# Patient Record
Sex: Female | Born: 1990 | Race: Black or African American | Hispanic: No | Marital: Single | Smoking: Former smoker
Health system: Southern US, Community
[De-identification: ages and names within clinical notes are randomized; demographics above are authoritative.]

## PROBLEM LIST (undated history)

## (undated) ENCOUNTER — Inpatient Hospital Stay (HOSPITAL_COMMUNITY): Payer: Self-pay

## (undated) DIAGNOSIS — F909 Attention-deficit hyperactivity disorder, unspecified type: Secondary | ICD-10-CM

## (undated) DIAGNOSIS — E0590 Thyrotoxicosis, unspecified without thyrotoxic crisis or storm: Secondary | ICD-10-CM

## (undated) DIAGNOSIS — N838 Other noninflammatory disorders of ovary, fallopian tube and broad ligament: Secondary | ICD-10-CM

## (undated) DIAGNOSIS — IMO0002 Reserved for concepts with insufficient information to code with codable children: Secondary | ICD-10-CM

## (undated) DIAGNOSIS — Z9889 Other specified postprocedural states: Secondary | ICD-10-CM

## (undated) DIAGNOSIS — F32A Depression, unspecified: Secondary | ICD-10-CM

## (undated) DIAGNOSIS — A599 Trichomoniasis, unspecified: Secondary | ICD-10-CM

## (undated) DIAGNOSIS — N739 Female pelvic inflammatory disease, unspecified: Secondary | ICD-10-CM

## (undated) DIAGNOSIS — O139 Gestational [pregnancy-induced] hypertension without significant proteinuria, unspecified trimester: Secondary | ICD-10-CM

## (undated) DIAGNOSIS — F319 Bipolar disorder, unspecified: Secondary | ICD-10-CM

## (undated) DIAGNOSIS — N912 Amenorrhea, unspecified: Secondary | ICD-10-CM

## (undated) DIAGNOSIS — R87629 Unspecified abnormal cytological findings in specimens from vagina: Secondary | ICD-10-CM

## (undated) DIAGNOSIS — O1500 Eclampsia in pregnancy, unspecified trimester: Secondary | ICD-10-CM

## (undated) DIAGNOSIS — R7989 Other specified abnormal findings of blood chemistry: Secondary | ICD-10-CM

## (undated) DIAGNOSIS — F329 Major depressive disorder, single episode, unspecified: Secondary | ICD-10-CM

## (undated) HISTORY — DX: Attention-deficit hyperactivity disorder, unspecified type: F90.9

## (undated) HISTORY — DX: Reserved for concepts with insufficient information to code with codable children: IMO0002

## (undated) HISTORY — PX: LEEP: SHX91

## (undated) HISTORY — DX: Thyrotoxicosis, unspecified without thyrotoxic crisis or storm: E05.90

## (undated) HISTORY — DX: Bipolar disorder, unspecified: F31.9

## (undated) HISTORY — DX: Other noninflammatory disorders of ovary, fallopian tube and broad ligament: N83.8

## (undated) HISTORY — DX: Amenorrhea, unspecified: N91.2

## (undated) HISTORY — DX: Unspecified abnormal cytological findings in specimens from vagina: R87.629

## (undated) HISTORY — DX: Other specified abnormal findings of blood chemistry: R79.89

## (undated) HISTORY — PX: WISDOM TOOTH EXTRACTION: SHX21

## (undated) HISTORY — DX: Female pelvic inflammatory disease, unspecified: N73.9

## (undated) HISTORY — DX: Eclampsia complicating pregnancy, unspecified trimester: O15.00

## (undated) HISTORY — DX: Other specified postprocedural states: Z98.890

---
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
## 2008-01-08 ENCOUNTER — Emergency Department (HOSPITAL_COMMUNITY): Admission: EM | Admit: 2008-01-08 | Discharge: 2008-01-08 | Payer: Self-pay | Admitting: Emergency Medicine

## 2008-03-26 ENCOUNTER — Emergency Department (HOSPITAL_COMMUNITY): Admission: EM | Admit: 2008-03-26 | Discharge: 2008-03-26 | Payer: Self-pay | Admitting: Family Medicine

## 2008-04-04 ENCOUNTER — Emergency Department (HOSPITAL_COMMUNITY): Admission: EM | Admit: 2008-04-04 | Discharge: 2008-04-04 | Payer: Self-pay | Admitting: Emergency Medicine

## 2008-04-15 ENCOUNTER — Emergency Department (HOSPITAL_COMMUNITY): Admission: EM | Admit: 2008-04-15 | Discharge: 2008-04-15 | Payer: Self-pay | Admitting: Emergency Medicine

## 2008-05-27 ENCOUNTER — Telehealth: Payer: Self-pay | Admitting: *Deleted

## 2008-06-03 ENCOUNTER — Other Ambulatory Visit: Admission: RE | Admit: 2008-06-03 | Discharge: 2008-06-03 | Payer: Self-pay | Admitting: Family Medicine

## 2008-06-03 ENCOUNTER — Encounter (INDEPENDENT_AMBULATORY_CARE_PROVIDER_SITE_OTHER): Payer: Self-pay | Admitting: Family Medicine

## 2008-06-03 ENCOUNTER — Ambulatory Visit: Payer: Self-pay | Admitting: Family Medicine

## 2008-06-03 ENCOUNTER — Encounter: Payer: Self-pay | Admitting: Family Medicine

## 2008-06-03 LAB — CONVERTED CEMR LAB
Beta hcg, urine, semiquantitative: NEGATIVE
Whiff Test: POSITIVE

## 2008-06-04 LAB — CONVERTED CEMR LAB
Pap Smear: ABNORMAL
Pap Smear: NORMAL

## 2008-06-05 LAB — CONVERTED CEMR LAB
Chlamydia, DNA Probe: NEGATIVE
GC Probe Amp, Genital: NEGATIVE

## 2008-06-10 ENCOUNTER — Telehealth: Payer: Self-pay | Admitting: Family Medicine

## 2008-06-12 ENCOUNTER — Encounter: Payer: Self-pay | Admitting: Family Medicine

## 2008-06-12 ENCOUNTER — Ambulatory Visit: Payer: Self-pay | Admitting: Sports Medicine

## 2008-06-12 ENCOUNTER — Telehealth: Payer: Self-pay | Admitting: *Deleted

## 2008-06-12 LAB — CONVERTED CEMR LAB
Beta hcg, urine, semiquantitative: NEGATIVE
HCT: 44.5 % (ref 36.0–46.0)
Hemoglobin: 15 g/dL (ref 12.0–15.0)
MCHC: 33.7 g/dL (ref 30.0–36.0)
MCV: 88.1 fL (ref 78.0–100.0)
Platelets: 286 10*3/uL (ref 150–400)
RBC: 5.05 M/uL (ref 3.87–5.11)
RDW: 13.5 % (ref 11.5–15.5)
TSH: 1.605 microintl units/mL (ref 0.350–4.50)
WBC: 10 10*3/uL (ref 4.0–10.5)

## 2008-06-18 ENCOUNTER — Emergency Department (HOSPITAL_COMMUNITY): Admission: EM | Admit: 2008-06-18 | Discharge: 2008-06-19 | Payer: Self-pay | Admitting: Emergency Medicine

## 2008-06-18 ENCOUNTER — Telehealth: Payer: Self-pay | Admitting: *Deleted

## 2008-06-19 ENCOUNTER — Encounter: Payer: Self-pay | Admitting: *Deleted

## 2008-06-20 ENCOUNTER — Ambulatory Visit: Payer: Self-pay | Admitting: Family Medicine

## 2008-06-20 DIAGNOSIS — F39 Unspecified mood [affective] disorder: Secondary | ICD-10-CM | POA: Insufficient documentation

## 2008-07-08 ENCOUNTER — Telehealth: Payer: Self-pay | Admitting: *Deleted

## 2008-07-14 ENCOUNTER — Encounter: Payer: Self-pay | Admitting: *Deleted

## 2008-07-27 ENCOUNTER — Emergency Department (HOSPITAL_COMMUNITY): Admission: EM | Admit: 2008-07-27 | Discharge: 2008-07-27 | Payer: Self-pay | Admitting: Emergency Medicine

## 2008-09-03 ENCOUNTER — Telehealth (INDEPENDENT_AMBULATORY_CARE_PROVIDER_SITE_OTHER): Payer: Self-pay | Admitting: *Deleted

## 2008-09-04 ENCOUNTER — Emergency Department (HOSPITAL_COMMUNITY): Admission: EM | Admit: 2008-09-04 | Discharge: 2008-09-04 | Payer: Self-pay | Admitting: Emergency Medicine

## 2008-09-16 ENCOUNTER — Encounter: Payer: Self-pay | Admitting: Family Medicine

## 2008-11-19 ENCOUNTER — Encounter: Payer: Self-pay | Admitting: Family Medicine

## 2008-11-19 ENCOUNTER — Ambulatory Visit: Payer: Self-pay | Admitting: Family Medicine

## 2008-11-19 LAB — CONVERTED CEMR LAB: Beta hcg, urine, semiquantitative: NEGATIVE

## 2008-11-28 ENCOUNTER — Telehealth: Payer: Self-pay | Admitting: *Deleted

## 2009-01-07 ENCOUNTER — Telehealth: Payer: Self-pay | Admitting: Family Medicine

## 2009-01-09 ENCOUNTER — Telehealth (INDEPENDENT_AMBULATORY_CARE_PROVIDER_SITE_OTHER): Payer: Self-pay | Admitting: Family Medicine

## 2009-02-18 ENCOUNTER — Ambulatory Visit: Payer: Self-pay | Admitting: Family Medicine

## 2009-04-06 ENCOUNTER — Ambulatory Visit: Payer: Self-pay | Admitting: Family Medicine

## 2009-04-06 ENCOUNTER — Encounter: Payer: Self-pay | Admitting: Family Medicine

## 2009-04-06 LAB — CONVERTED CEMR LAB
ALT: 9 units/L (ref 0–35)
AST: 17 units/L (ref 0–37)
Albumin: 4.6 g/dL (ref 3.5–5.2)
Alkaline Phosphatase: 67 units/L (ref 39–117)
BUN: 11 mg/dL (ref 6–23)
Basophils Absolute: 0.1 10*3/uL (ref 0.0–0.1)
Basophils Relative: 1 % (ref 0–1)
CO2: 22 meq/L (ref 19–32)
Calcium: 10.2 mg/dL (ref 8.4–10.5)
Chloride: 103 meq/L (ref 96–112)
Creatinine, Ser: 0.88 mg/dL (ref 0.40–1.20)
Eosinophils Absolute: 0.3 10*3/uL (ref 0.0–0.7)
Eosinophils Relative: 4 % (ref 0–5)
Glucose, Bld: 74 mg/dL (ref 70–99)
HCT: 43.8 % (ref 36.0–46.0)
Hemoglobin: 14.5 g/dL (ref 12.0–15.0)
Lymphocytes Relative: 37 % (ref 12–46)
Lymphs Abs: 2.6 10*3/uL (ref 0.7–4.0)
MCHC: 33.1 g/dL (ref 30.0–36.0)
MCV: 89.6 fL (ref 78.0–100.0)
Monocytes Absolute: 0.5 10*3/uL (ref 0.1–1.0)
Monocytes Relative: 7 % (ref 3–12)
Neutro Abs: 3.6 10*3/uL (ref 1.7–7.7)
Neutrophils Relative %: 52 % (ref 43–77)
Platelets: 269 10*3/uL (ref 150–400)
Potassium: 4.3 meq/L (ref 3.5–5.3)
RBC: 4.89 M/uL (ref 3.87–5.11)
RDW: 12.6 % (ref 11.5–15.5)
Sodium: 135 meq/L (ref 135–145)
TSH: 2.222 microintl units/mL (ref 0.350–4.500)
Total Bilirubin: 0.4 mg/dL (ref 0.3–1.2)
Total Protein: 8.1 g/dL (ref 6.0–8.3)
WBC: 6.9 10*3/uL (ref 4.0–10.5)

## 2009-06-06 ENCOUNTER — Telehealth: Payer: Self-pay | Admitting: Family Medicine

## 2009-06-11 ENCOUNTER — Ambulatory Visit: Payer: Self-pay | Admitting: Family Medicine

## 2009-06-11 LAB — CONVERTED CEMR LAB: Beta hcg, urine, semiquantitative: NEGATIVE

## 2009-06-15 ENCOUNTER — Ambulatory Visit: Payer: Self-pay | Admitting: Family Medicine

## 2009-08-08 ENCOUNTER — Encounter (INDEPENDENT_AMBULATORY_CARE_PROVIDER_SITE_OTHER): Payer: Self-pay | Admitting: *Deleted

## 2009-08-08 DIAGNOSIS — F17200 Nicotine dependence, unspecified, uncomplicated: Secondary | ICD-10-CM | POA: Insufficient documentation

## 2009-09-30 ENCOUNTER — Encounter: Payer: Self-pay | Admitting: Family Medicine

## 2009-09-30 ENCOUNTER — Ambulatory Visit: Payer: Self-pay | Admitting: Family Medicine

## 2009-09-30 DIAGNOSIS — R011 Cardiac murmur, unspecified: Secondary | ICD-10-CM

## 2009-09-30 LAB — CONVERTED CEMR LAB
Beta hcg, urine, semiquantitative: NEGATIVE
HCT: 41.5 % (ref 36.0–46.0)
Hemoglobin: 13.7 g/dL (ref 12.0–15.0)
MCHC: 33 g/dL (ref 30.0–36.0)
MCV: 93 fL (ref 78.0–100.0)
RDW: 12.1 % (ref 11.5–15.5)

## 2009-10-26 ENCOUNTER — Emergency Department (HOSPITAL_COMMUNITY): Admission: EM | Admit: 2009-10-26 | Discharge: 2009-10-26 | Payer: Self-pay | Admitting: Emergency Medicine

## 2009-12-18 ENCOUNTER — Ambulatory Visit: Payer: Self-pay | Admitting: Family Medicine

## 2010-01-01 ENCOUNTER — Encounter: Payer: Self-pay | Admitting: Family Medicine

## 2010-01-01 ENCOUNTER — Telehealth: Payer: Self-pay | Admitting: Family Medicine

## 2010-01-27 ENCOUNTER — Telehealth: Payer: Self-pay | Admitting: Family Medicine

## 2010-01-27 ENCOUNTER — Encounter: Payer: Self-pay | Admitting: *Deleted

## 2010-01-27 ENCOUNTER — Ambulatory Visit: Payer: Self-pay | Admitting: Family Medicine

## 2010-01-29 ENCOUNTER — Ambulatory Visit: Payer: Self-pay | Admitting: Family Medicine

## 2010-02-09 ENCOUNTER — Emergency Department (HOSPITAL_COMMUNITY): Admission: EM | Admit: 2010-02-09 | Discharge: 2010-02-09 | Payer: Self-pay | Admitting: Emergency Medicine

## 2010-02-12 ENCOUNTER — Ambulatory Visit: Payer: Self-pay | Admitting: Family Medicine

## 2010-03-05 ENCOUNTER — Emergency Department (HOSPITAL_COMMUNITY): Admission: EM | Admit: 2010-03-05 | Discharge: 2010-03-05 | Payer: Self-pay | Admitting: Emergency Medicine

## 2010-04-07 ENCOUNTER — Ambulatory Visit: Payer: Self-pay | Admitting: Family Medicine

## 2010-05-08 ENCOUNTER — Telehealth: Payer: Self-pay | Admitting: Family Medicine

## 2010-07-29 ENCOUNTER — Ambulatory Visit: Payer: Self-pay | Admitting: Family Medicine

## 2010-07-29 LAB — CONVERTED CEMR LAB: Beta hcg, urine, semiquantitative: POSITIVE

## 2010-07-30 ENCOUNTER — Telehealth: Payer: Self-pay | Admitting: Family Medicine

## 2010-08-03 ENCOUNTER — Encounter: Payer: Self-pay | Admitting: Family Medicine

## 2010-08-05 ENCOUNTER — Telehealth: Payer: Self-pay | Admitting: Family Medicine

## 2010-08-13 ENCOUNTER — Encounter: Payer: Self-pay | Admitting: Family Medicine

## 2010-08-13 ENCOUNTER — Ambulatory Visit: Payer: Self-pay | Admitting: Family Medicine

## 2010-08-13 LAB — CONVERTED CEMR LAB
Eosinophils Relative: 1 % (ref 0–5)
HCT: 33.5 % — ABNORMAL LOW (ref 36.0–46.0)
Hemoglobin: 11.6 g/dL — ABNORMAL LOW (ref 12.0–15.0)
Lymphocytes Relative: 30 % (ref 12–46)
Lymphs Abs: 2.9 10*3/uL (ref 0.7–4.0)
Monocytes Absolute: 0.6 10*3/uL (ref 0.1–1.0)
Neutro Abs: 5.9 10*3/uL (ref 1.7–7.7)
RBC: 3.87 M/uL (ref 3.87–5.11)
Rh Type: POSITIVE
Rubella: 31.8 intl units/mL — ABNORMAL HIGH
Sickle Cell Screen: NEGATIVE
WBC: 9.5 10*3/uL (ref 4.0–10.5)

## 2010-08-20 ENCOUNTER — Other Ambulatory Visit: Admission: RE | Admit: 2010-08-20 | Discharge: 2010-08-20 | Payer: Self-pay | Admitting: Family Medicine

## 2010-08-20 ENCOUNTER — Ambulatory Visit: Payer: Self-pay | Admitting: Family Medicine

## 2010-08-20 ENCOUNTER — Encounter: Payer: Self-pay | Admitting: Family Medicine

## 2010-08-20 LAB — CONVERTED CEMR LAB
Chlamydia, DNA Probe: NEGATIVE
Pap Smear: NEGATIVE

## 2010-08-24 ENCOUNTER — Ambulatory Visit (HOSPITAL_COMMUNITY): Admission: RE | Admit: 2010-08-24 | Discharge: 2010-08-24 | Payer: Self-pay | Admitting: Family Medicine

## 2010-09-14 ENCOUNTER — Ambulatory Visit (HOSPITAL_COMMUNITY): Admission: RE | Admit: 2010-09-14 | Discharge: 2010-09-14 | Payer: Self-pay | Admitting: Family Medicine

## 2010-09-22 ENCOUNTER — Ambulatory Visit: Payer: Self-pay | Admitting: Family Medicine

## 2010-10-05 ENCOUNTER — Ambulatory Visit (HOSPITAL_COMMUNITY): Admission: RE | Admit: 2010-10-05 | Discharge: 2010-10-05 | Payer: Self-pay | Admitting: Family Medicine

## 2010-10-22 ENCOUNTER — Encounter: Payer: Self-pay | Admitting: *Deleted

## 2010-10-25 ENCOUNTER — Ambulatory Visit (HOSPITAL_COMMUNITY)
Admission: RE | Admit: 2010-10-25 | Discharge: 2010-10-25 | Payer: Self-pay | Source: Home / Self Care | Admitting: Family Medicine

## 2010-11-02 ENCOUNTER — Ambulatory Visit: Payer: Self-pay | Admitting: Family Medicine

## 2010-11-02 ENCOUNTER — Encounter: Payer: Self-pay | Admitting: Family Medicine

## 2010-11-02 DIAGNOSIS — G479 Sleep disorder, unspecified: Secondary | ICD-10-CM | POA: Insufficient documentation

## 2010-11-02 LAB — CONVERTED CEMR LAB: TSH: 2.056 microintl units/mL (ref 0.350–4.500)

## 2010-11-03 ENCOUNTER — Telehealth: Payer: Self-pay | Admitting: Family Medicine

## 2010-11-17 ENCOUNTER — Ambulatory Visit: Payer: Self-pay | Admitting: Family Medicine

## 2010-11-17 DIAGNOSIS — N760 Acute vaginitis: Secondary | ICD-10-CM | POA: Insufficient documentation

## 2010-11-17 DIAGNOSIS — R109 Unspecified abdominal pain: Secondary | ICD-10-CM | POA: Insufficient documentation

## 2010-11-17 LAB — CONVERTED CEMR LAB
Bilirubin Urine: NEGATIVE
Glucose, Urine, Semiquant: NEGATIVE
Ketones, urine, test strip: NEGATIVE
Specific Gravity, Urine: >=1.03
Urobilinogen, UA: 0.2

## 2010-11-18 ENCOUNTER — Encounter: Payer: Self-pay | Admitting: Family Medicine

## 2010-12-01 ENCOUNTER — Telehealth: Payer: Self-pay | Admitting: Family Medicine

## 2010-12-07 ENCOUNTER — Encounter: Payer: Self-pay | Admitting: Family Medicine

## 2010-12-11 ENCOUNTER — Encounter: Payer: Self-pay | Admitting: Family Medicine

## 2010-12-13 ENCOUNTER — Ambulatory Visit: Admission: RE | Admit: 2010-12-13 | Discharge: 2010-12-13 | Payer: Self-pay | Source: Home / Self Care

## 2010-12-21 NOTE — Progress Notes (Signed)
Summary: triage  Phone Note Call from Patient Call back at Home Phone [phone]   Caller: Patient Summary of Call: body is sore - wants to come in today Initial call taken by: De Nurse,  January 27, 2010 8:35 AM  Follow-up for Phone Call        she started this yesterday. took advil but states it made it worse. also has a pain in her "belly" offered wi now. she will be here at 1:30 today.  discussed her many DNKAs. told her she is risking suspension and may need to find another md if this continues. states she will kep this appt. FYI to Dennison Nancy, RN Follow-up by: Golden Circle RN,  January 27, 2010 8:42 AM

## 2010-12-21 NOTE — Assessment & Plan Note (Signed)
Summary: NOB/DSL   Vital Signs:  Patient profile:   20 year old female LMP:     05/28/2010 Height:      63.25 inches Weight:      107.1 pounds BMI:     18.89 Pulse rate:   74 / minute BP sitting:   122 / 69  Menstrual History Menarche (age onset-years):  7 Menses Interval (days):  45 days (pt was on depo would miss shots at time. Last Depo shot in Feb 2011 Menstrual Flow (days):  5  Primary Provider:  Bobby Rumpf  MD   History of Present Illness: 20 yo G1P0 here for first OB visit.  LMP 05/28/10. Pt reports  that she is sure of her LMP. However, reports that the menstrual periods wasabnormally long (14 days) and heavy. She was using Depo for birth control. Pt reports that she ocassionaly miss shots, last shot was in February.   See flowsheet for ROS and OB exam.   Pt admits to abdominal cramping, similar to what she feels when she has her menstrual period. Most days of the week that last for 10 mins. She describes that pain as light to moderate. She has not taken any pain medication.   Pt admits to sharp frontal HA. HA last for about 10 minutes and then resolve. She has HA most days, but not everyday. She has not taken any medication for the HA.   Habits & Providers  Alcohol-Tobacco-Diet     Cigarette Packs/Day: n/a  Allergies: No Known Drug Allergies  Social History: sexually active.  lives with mom.  boyfriend (age 28).  is in contact with her mom, 5 sisters, niece.  Smoked 1/2 ppd of cigarettes. No smoking now. denies drug or etoh use.  currently between 10-11th grade.  looking for job.   sister: Radford Pax, cherish Apo.  mother Etheleen Sia cummingsEducation:  10th grade Hepatitis Risk:  no Packs/Day:  n/a   Impression & Recommendations:  Problem # 1:  SUPERVISION OF NORMAL FIRST PREGNANCY (ICD-V22.0) 20 yo G1P0 at [redacted]w[redacted]d today based  reliable LMP of 05/28/10 --> EDC 03/04/11.  Labs: O +, Ab neg.  H/H 11.6/33.5, Plt 280. HbsAg neg, RPR neg, Rubella immune, HIV neg,  sickle cell neg, GBS neg.   Plan: Start PNV With difficulty assessing FHT, will schedule T1 [place] for dating. Scheduled.  Pt elected for integrated genetic screen. Scheduled  Exposure to chicken-pox unknowns status, will draw titers today. PAP, GC/Chlam and wet prep today.   Orders: Miscellaneous Lab Charge-[facility] [phone]) Prenatal U/S < 14 weeks - 60454  (Prenatal U/S) Obstetric Referral (Obstetric) Medicaid OB visit - [facility] ([id number])  Complete Medication List: 1)  Th Prenatal Vitamins 28-0.8 Mg Tabs (Prenatal vit-fe fumarate-fa) .... Take one tab daily  Other Orders: GC/Chlamydia-[facility] ([id number]) [id number] Pap Smear-[facility] ([id number])  Patient Instructions: 1)  Lollie, 2)  Thank you for coming in today. 3)  It was nice to meet you.  4)  I look forward to taking care of you during your pregnancy. 5)  Take your prenatal vitamin daily. 6)  Drink plenty of fluids and eat a well balanced diet.  7)  You will have and ultrasound to confirm your dates. This will be done in the radiology department at [facility].  8)  [address], [place], [place] [address]  9)  As we discussed, you will have the integrated genetic screen to assess for genetic risk. It is 2 blood test and an ultrasound.  10)  Please schedule a f/u vist with me for 4 weeks.  11)  -Dr. Armen Pickup Prescriptions: TH PRENATAL VITAMINS 28-0.8 MG TABS (PRENATAL VIT-FE FUMARATE-FA) Take one tab daily  #100 x 6   Entered and Authorized by:   Dessa Phi MD   Signed by:   Dessa Phi MD on 08/20/2010   Method used:   Print then Give to Patient   RxID:   [id number]    OB Initial Intake Information    Positive HCG by: office test 07/29/10    Race: Black    Marital status: Single    Occupation: unemployed    Education (last grade completed): 10th grade    Number of children at home: 0    Newborn's physician: Dr. Armen Pickup  FOB Information    Husband/Father of baby: Rickey Barbara    FOB  occupation unemployed  Menstrual History    LMP (date): 05/28/2010    EDC by LMP: 03/04/2011    Best Working EDC: 03/04/2011    LMP - Character: heavy    LMP - Reliable? : Yes    Menarche: 7 years    Menses interval: 45 days (pt was on depo would miss shots at time. Last Depo shot in Feb 2011 days    Menstrual flow 5 days    On BCP's at conception: no    Pre Pregnancy Weight: 103 lbs.    Symptoms since LMP: amenorrhea, fatigue, irritability, tender breasts    Other symptoms: abdominal cramping     OB Initial Intake Information    Positive HCG by: office test 07/29/10    Race: Black    Marital status: Single    Occupation: unemployed    Education (last grade completed): 10th grade    Number of children at home: 0    Newborn's physician: Dr. Armen Pickup  FOB Information    Husband/Father of baby: Rickey Barbara    FOB occupation unemployed  Menstrual History    LMP (date): 05/28/2010    EDC by LMP: 03/04/2011    Best Working EDC: 03/04/2011    LMP - Character: heavy    LMP - Reliable? : Yes    Menarche: 7 years    Menses interval: 45 days (pt was on depo would miss shots at time. Last Depo shot in Feb 2011 days    Menstrual flow 5 days    On BCP's at conception: no    Pre Pregnancy Weight: 103 lbs.    Symptoms since LMP: amenorrhea, fatigue, irritability, tender breasts    Other symptoms: abdominal cramping    Flowsheet View for Follow-up Visit    Estimated weeks of       gestation:     12 0/7    Weight:     107.1    Blood pressure:   122 / 69    Headache:     few    Nausea/vomiting:   No    Edema:     0    Vaginal bleeding:   no    Vaginal discharge:   d/c    FHR:       ?    Fetal activity:     no    Labor symptoms:   no    Cx Dilation:     0    Cx Effacement:   0%    Cx Station:     high    Taking prenatal vits?   N    Smoking:     n/a  Next visit:     4 wk    Resident:     Armen Pickup    Preceptor:     Mauricio Po    Comment:     Unable to hear FHT with  doppler. Was able to see fetal hrt movement on bedside sono.     Vital Signs:  Patient Profile:   20 year old female LMP:     05/28/2010 Height:     63.25 inches (161.29 cm) Weight:      107.1 pounds BMI:     18.89 Pulse rate:   74 / minute BP sitting:   122 / 69  Vitals Entered By: Garen Grams LPN (August 20, 2010 1:53 PM)                Prenatal Visit    FOB name: Rickey Barbara [facility] Confirmation:    New working [facility]: 03/04/2011    LMP reliable? Yes    Last menses onset (LMP) date: 05/28/2010    EDC by LMP: 03/04/2011   Past Pregnancy History    Gravida:     1    Para:       0   Genetic History    Father of baby:   Rickey Barbara     Thalassemia:     mother: no    Neural tube defect:   mother: no    Down's Syndrome:   mother: no    Tay-Sachs:     mother: no    Sickle Cell Dz/Trait:   mother: no    Hemophilia:     mother: no    Muscular Dystrophy:   mother: no    Cystic Fibrosis:   mother: no    Huntington's Dz:   mother: no    Mental Retardation:   mother: no    Fragile X:     mother: no    Other Genetic or       Chromosomal Dz:   mother: no    Child with other       birth defect:     mother: no    > 3 spont. abortions:   mother: no    Hx of stillbirth:     mother: no  Infection Risk History    High Risk Hepatitis B: no    Immunized against Hepatitis B: no    Exposure to TB: no    Patient with history of Genital Herpes: no    Sexual partner with history of Genital Herpes: no    History of STD (GC, Chlamydia, Syphilis, HPV): yes    Specific STD: Chlamydia and GC    Rash, Viral, or Febrile Illness since LMP: no    Exposure to Cat Litter: no    Chicken Pox Immune Status: Non-immune    History of Parvovirus (Fifth Disease): no    Occupational Exposure to Children: none  Environmental Exposures    Xray Exposure since LMP: no    Chemical or other exposure: no    Medication, drug, or alcohol use since LMP: no    Laboratory Results   Date/Time Received: August 20, 2010 3:07 PM  Date/Time Reported: August 20, 2010 3:21 PM   [employer] Source: vaginal WBC/hpf: 1-5 Bacteria/hpf: 3+  Cocci Clue cells/hpf: moderate  Positive whiff Yeast/hpf: none Trichomonas/hpf: none Comments: ...........test performed by...........Marland KitchenTerese Door, CMA

## 2010-12-21 NOTE — Miscellaneous (Signed)
Summary: 5th digit injury//Kaitlyn Hunt   Allergies: No Known Drug Allergies   Complete Medication List: 1)  Ibuprofen 800 Mg Tabs (Ibuprofen) .... One tab by mouth q8 hours as needed pain 2)  Depo-subq Provera 104 104 Mg/0.35ml Susp (Medroxyprogesterone acetate) 3)  Depakote Er 250 Mg Xr24h-tab (Divalproex sodium) .Marland Kitchen.. 1 by mouth at bedtime for 2 days then 2 by mouth at bedtime thereafter for bipolar 4)  Crutches  .... Please use as directed. dispense 1 pair.  Other Orders: No Charge Patient Arrived (NCPA0) (NCPA0)  Appt cancelled after arriving. Dennison Nancy RN  January 03, 2010 10:03 AM

## 2010-12-21 NOTE — Progress Notes (Signed)
Summary: NOB   Phone Note Call from Patient Call back at [phone]   Caller: Patient Summary of Call: wants to start prenatal care - needs labs and NOB appt Initial call taken by: De Nurse,  July 30, 2010 12:28 PM  Follow-up for Phone Call        Appts scheduled as follow:   Lab 08/13/10 @ 1:30pm Dr. Armen Pickup 08/20/10 @ 1:30pm Pt. Notified of appts. Follow-up by: Terese Door,  July 30, 2010 12:43 PM  Additional Follow-up for Phone Call Additional follow up Details #1::        Reviewed  Additional Follow-up by: Bobby Rumpf  MD,  August 02, 2010 9:00 AM

## 2010-12-21 NOTE — Progress Notes (Signed)
Summary: triage  Phone Note Call from Patient Call back at [phone]   Caller: Patient Summary of Call: Her pinky finger is swollen and not sure if she might have broke it. Initial call taken by: Clydell Hakim,  January 01, 2010 10:57 AM  Follow-up for Phone Call        left message  sent to [facility] as well due to multiple [facility] Follow-up by: Golden Circle RN,  January 01, 2010 11:02 AM  Additional Follow-up for Phone Call Additional follow up Details #1::        injured it when a dresser drawer dropped on her hand."a while ago"  hurts when she bends it. has not taken any meds. has not iced it. work in at [employer]. told her to go ahead & take some tylenol or ibuprofen now. discussed her multiple DNKAs with her. asked that she give 24hrs notice if unable to make it. told her she may will be getting a letter saying the same. told her if this continues, she may be asked to find another md. Additional Follow-up by: Golden Circle RN,  January 01, 2010 11:28 AM

## 2010-12-21 NOTE — Progress Notes (Signed)
Summary: Exposure to Varicella Zoster    Phone Note Outgoing Call   Call placed by: Bobby Rumpf MD  Call placed to: Patient Summary of Call: Attempted to call patient regarding history of exposure to Varicella Zoster, as there was a child with chicken pox in the waiting room while she was waiting to be seen. Left message for patient to call back to discuss .  Initial call taken by: Bobby Rumpf  MD,  August 05, 2010 11:16 AM  Follow-up for Phone Call        Patient called back, does not know if she had vaccine, does not think she had chicken pox in the past. Will look up vaccine record. Sent to blue team staff.  Follow-up by: Bobby Rumpf  MD,  August 05, 2010 12:23 PM

## 2010-12-21 NOTE — Progress Notes (Signed)
Summary: Painful finger  Phone Note Outgoing Call   Call placed by: Sairah Knobloch MD,  May 08, 2010 12:14 PM Call placed to: Patient Summary of Call: Middle finger on Left hand: swollen, red, pain x at least 1 month.  Nail on it has been bothering her for close to 1 yr.  But now seems more painful, pain started couple of days ago.  Have not taken ibuprofen or tylenol for pain.   Advised pt to take tylenol or ibuprofen for pain.  Pt can call clinic on Monday for appt.  She agreed.  Initial call taken by: Mikya Don MD,  May 08, 2010 12:19 PM

## 2010-12-21 NOTE — Assessment & Plan Note (Signed)
Summary: STD treatment/ls  Nurse Visit   Allergies: No Known Drug Allergies  Medication Administration  Injection # 1:    Medication: Rocephin  250mg     Diagnosis: GONORRHEA (ICD-098.0)    Route: IM    Site: LUOQ gluteus    Exp Date: 05/2012    Lot #: [id number]    Mfr: Sandoz    Patient tolerated injection without complications    Given by: Theresia Lo RN (January 29, 2010 5:17 PM)  Medication # 1:    Medication: Azithromycin oral    Diagnosis: CHLAMYDIAL INFECTION (ICD-099.41)    Dose: 1 gram    Route: po    Exp Date: 10/21/2010    Lot #: [id number]    Mfr: greenstone    Patient tolerated medication without complications    Given by: Theresia Lo RN (January 29, 2010 5:18 PM)  Orders Added: 1)  Rocephin  250mg  [J0696] 2)  Azithromycin oral [Q0144] 3)  Admin of Injection (IM/SQ) [51884]   Medication Administration  Injection # 1:    Medication: Rocephin  250mg     Diagnosis: GONORRHEA (ICD-098.0)    Route: IM    Site: LUOQ gluteus    Exp Date: 05/2012    Lot #: [id number]    Mfr: Sandoz    Patient tolerated injection without complications    Given by: Theresia Lo RN (January 29, 2010 5:17 PM)  Medication # 1:    Medication: Azithromycin oral    Diagnosis: CHLAMYDIAL INFECTION (ICD-099.41)    Dose: 1 gram    Route: po    Exp Date: 10/21/2010    Lot #: [id number]    Mfr: greenstone    Patient tolerated medication without complications    Given by: Theresia Lo RN (January 29, 2010 5:18 PM)  Orders Added: 1)  Rocephin  250mg  [J0696] 2)  Azithromycin oral [Q0144] 3)  Admin of Injection (IM/SQ) [32355]   patient given Carenote on both diseases. advised her to tell her partner to be treated. advised to abstain from sex for 7 days, and always use condoms. patient waited in office 20 minutes after injection without any complication. Theresia Lo RN  January 29, 2010 5:20 PM  Appended Document: STD treatment/ls Communicable Disease  report faxed to [facility]

## 2010-12-21 NOTE — Assessment & Plan Note (Signed)
Summary: pregnancy test & see doctor,tcb   Vital Signs:  Patient profile:   20 year old female Height:      63.25 inches Weight:      106 pounds BMI:     18.70 BSA:     1.48 Temp:     98.7 degrees F Pulse rate:   94 / minute BP sitting:   122 / 74  Vitals Entered By: Jone Baseman CMA (July 29, 2010 3:26 PM) CC: preg test Is Patient Diabetic? No Pain Assessment Patient in pain? no        Primary Care Provider:  Ancil Boozer  MD  CC:  preg test.  History of Present Illness: 1) Pregnant?: LMP 05/28/10. +ve urine pregnancy test in clinic today.  Denies breast tenderness, nausea, emesis, mood changes, bleeding, contractions,   Habits & Providers  Alcohol-Tobacco-Diet     Tobacco Status: current     Tobacco Counseling: to quit use of tobacco products     Cigarette Packs/Day: 1.0     Year Started: 2009  Allergies (verified): No Known Drug Allergies  Physical Exam  General:  tearful about pregnancy, NAD  Lungs:  Normal respiratory effort, chest expands symmetrically. Lungs are clear to auscultation, no crackles or wheezes. Heart:  Normal rate and regular rhythm. S1 and S2 normal without gallop, murmur, click, rub or other extra sounds. Abdomen:  uterus < 20 weeks, +BS, S/NT  Pulses:  2+ radials  Extremities:  no edema  Neurologic:  alert & oriented X3.   Psych:  tearful about pregnancy    Impression & Recommendations:  Problem # 1:  AMENORRHEA (ICD-626.0) Assessment New +ve pregnancy test today. Visibly upset. Wishes to terminate. Resources given. Advised to follow up after thinking things over and making decision.   Complete Medication List: 1)  Depo-subq Provera 104 104 Mg/0.10ml Susp (Medroxyprogesterone acetate) 2)  Hydroxyzine Hcl 50 Mg Tabs (Hydroxyzine hcl) .Marland Kitchen.. 1 by mouth q6hr as needed severe itching. 3)  Ranitidine Hcl 150 Mg Tabs (Ranitidine hcl) .Marland Kitchen.. 1 by mouth two times a day for 2 weeks then as needed. 4)  Triamcinolone Acetonide 0.1 %  Oint (Triamcinolone acetonide) .... Apply to very itchy spots two times a day as needed. disp 30g.  Other Orders: U Preg-[facility] [phone]) [facility]- Est Level  3 ([id number])  Laboratory Results   Urine Tests  Date/Time Received: July 29, 2010 3:21 PM  Date/Time Reported: July 29, 2010 3:30 PM     Urine HCG: positive Comments: ...........test performed by...........Marland KitchenTerese Door, CMA
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
## 2010-12-21 NOTE — Assessment & Plan Note (Signed)
Summary: body is sore/[place]   Vital Signs:  Patient profile:   20 year old female Weight:      100.2 pounds Temp:     98.6 degrees F oral Pulse rate:   67 / minute BP sitting:   111 / 73  (left arm)  Vitals Entered By: Loralee Pacas CMA (January 27, 2010 1:42 PM) Comments body has be achy x 1 day, headaches and weak   Primary Care Provider:  Ancil Boozer  MD   History of Present Illness: 20 yo called for work in today for body aches.  Woke up this morning feelign sore in chest, arms, and lower pelvis.  Denies any new activities, new medicines, drugs, vaginal discharge, dysuria.  Has taken one ibuprofen for this.  Allergies: No Known Drug Allergies PMH-FH-SH reviewed-no changes except otherwise noted  Review of Systems      See HPI General:  Denies fatigue, fever, and weakness. MS:  Complains of muscle aches; denies joint pain, joint redness, joint swelling, loss of strength, and muscle weakness.  Physical Exam  General:  thin AAF. Chest Wall:  when pressing on sternum patient jumps as this reproduces pain. Lungs:  Normal respiratory effort, chest expands symmetrically. Lungs are clear to auscultation, no crackles or wheezes. Heart:  RRR.  2/6 SEM bilateral upper sternal borders.  nonradiating to carotids or axilla.  no gallops or rubs.  Genitalia:  No CVA tenderness.  Thick white discharge. No sig pain to palpation of lower pelvis.  Pelvic Exam:        External: normal female genitalia without lesions or masses        Vagina: normal without lesions or masses        Cervix: normal without lesions or masses        Adnexa: normal bimanual exam without masses or fullness        Uterus: normal by palpation        Pap smear: not performed Msk:  no pain on palpation of deltoids, biceps   Impression & Recommendations:  Problem # 1:  MUSCULOSKELETAL PAIN (ICD-781.99)  Patient with only one day hsitory of vague myalgias but no evidence of pain or tenderness in muscles.  Pain  elicited on palpation of sternum.  WIll treat for costochondritis with ibuprofen.  Also checked for STD's today as she notes some pelvic pain and patient is high risk for STD's and would like to rule out PID as cause for myalgias/pelvic pain. Only1 day in duration.  Aked to follow-up if no resolution.  I suspect a large somatic component to this.  patient not reporting any vaginal symptoms so will not treat BV at this time since there is no clear correlation with PID.  Orders: [facility]- Est Level  3 (84166)  Complete Medication List: 1)  Ibuprofen 800 Mg Tabs (Ibuprofen) .... One tab by mouth q8 hours as needed pain 2)  Depo-subq Provera 104 104 Mg/0.51ml Susp (Medroxyprogesterone acetate) 3)  Depakote Er 250 Mg Xr24h-tab (Divalproex sodium) .Marland Kitchen.. 1 by mouth at bedtime for 2 days then 2 by mouth at bedtime thereafter for bipolar 4)  Crutches  .... Please use as directed. dispense 1 pair.  Other Orders: GC/Chlamydia-[facility] ([id number]) [phone])  Patient Instructions: 1)  I think you have an inflammation in your chest- this is not dangerous and will go away in time.  You have a prescription for ibuprofen which will help this. 2)  I will call you if your gonorrhea  and chlamydia tests come back abnormal.  If normal, I will send you a letter. Prescriptions: IBUPROFEN 800 MG TABS (IBUPROFEN) one tab by mouth q8 hours as needed pain  #60 x 0   Entered and Authorized by:   Delbert Harness MD   Signed by:   Delbert Harness MD on 01/27/2010   Method used:   Print then Give to Patient   RxID:   [id number]   Laboratory Results  Date/Time Received: January 27, 2010 2:04 PM  Date/Time Reported: January 27, 2010 2:24 PM   Wet [place] Source: vaginal WBC/hpf: 10-15 Bacteria/hpf: 3+  Cocci Clue cells/hpf: many  Positive whiff Yeast/hpf: none Trichomonas/hpf: none Comments: ...........test performed by...........Marland KitchenTerese Door, CMA

## 2010-12-21 NOTE — Letter (Signed)
Summary: Probation Letter  [facility]  [address]   [place], [place] [address]   Phone: [phone]  Fax: [phone]    01/27/2010  Dalinda T Kindig [address] [place], [place]  [address]  Dear Ms. Bonet,  With the goal of better serving all our patients the [facility] is following each patient's missed appointments.  You have missed at least 3 appointments with our practice.If you cannot keep your appointment, we expect you to call at least 24 hours before your appointment time.  Missing appointments prevents other patients from seeing [place] and makes it difficult to provide you with the best possible medical care.      1.   If you miss one more appointment, we will only give you limited medical services. This means we will not call in medication refills, complete a form, or make a referral for you except when you are here for a scheduled office visit.    2.   If you miss 2 or more appointments in the next year, we will dismiss you from our practice.    Our office staff can be reached at [phone] Monday through Friday from 8:30 a.m.-5:00 p.m. and will be glad to schedule your appointment as necessary.    Thank you.   [facility]

## 2010-12-21 NOTE — Assessment & Plan Note (Signed)
Summary: depo inj,tcb  Nurse Visit   Allergies: No Known Drug Allergies  Medication Administration  Injection # 1:    Medication: Depo-Provera 150mg     Diagnosis: CONTRACEPTIVE MANAGEMENT (ICD-V25.09)    Route: IM    Site: L deltoid    Exp Date: 03/2012    Lot #: [id number]    Mfr: Waylan Boga    Comments: nest depo due April 15 thru March 19, 2010    Patient tolerated injection without complications    Given by: Theresia Lo RN (December 18, 2009 9:46 AM)  Orders Added: 1)  Depo-Provera 150mg  [J1055] 2)  Admin of Injection (IM/SQ) [04540]   Medication Administration  Injection # 1:    Medication: Depo-Provera 150mg     Diagnosis: CONTRACEPTIVE MANAGEMENT (ICD-V25.09)    Route: IM    Site: L deltoid    Exp Date: 03/2012    Lot #: [id number]    Mfr: Waylan Boga    Comments: nest depo due April 15 thru March 19, 2010    Patient tolerated injection without complications    Given by: Theresia Lo RN (December 18, 2009 9:46 AM)  Orders Added: 1)  Depo-Provera 150mg  [J1055] 2)  Admin of Injection (IM/SQ) [[id number]]

## 2010-12-21 NOTE — Assessment & Plan Note (Signed)
Summary: ob,df   Vital Signs:  Patient profile:   20 year old female Height:      63.25 inches Weight:      104.4 pounds BMI:     18.41 Temp:     98.4 degrees F oral Pulse rate:   97 / minute BP sitting:   119 / 74  (left arm) Cuff size:   regular  Vitals Entered By: Jimmy Footman, CMA (September 22, 2010 1:39 PM) CC: OB visit 13w 3/7d Is Patient Diabetic? No Pain Assessment Patient in pain? no      LMP (date): 05/28/2010 EDC 03/27/2011 LMP - Character: heavy LMP - Reliable? NO Menarche (age onset years): 7   Menses interval (days): 45 days (pt was on depo would miss shots at time. Last Depo shot in Feb 2011 Menstrual flow (days): 5 On BCP's at conception: no Last PAP Result NEGATIVE FOR INTRAEPITHELIAL LESIONS OR MALIGNANCY.   Primary Provider:  Bobby Rumpf  MD  CC:  OB visit 13w 3/7d.  History of Present Illness: 20 yo G1P0 at 13w 3d. EDC: 03/27/11, based off of first trimester ultrasound (08/24/10). Here for f/u OB visit.  Pt admits to ocassional HA (3x/week), moderate in intensity, resolve with rest. Pt denies: LE edema, vaginal bleeding, LOF, FM.    See OB flowsheet for other pertinents.  Social: Pt set up with WIC- receiving milk and cheese. Pt has appt today at 3 PM for pregnancy care management assessment.  Preventive Screening-Counseling & Management  Alcohol-Tobacco     Smoking Status: current     Packs/Day: 11  Allergies: No Known Drug Allergies  Social History: Packs/Day:  11  Review of Systems       Pt denies swelling.   Impression & Recommendations:  Problem # 1:  SUPERVISION OF NORMAL FIRST PREGNANCY (ICD-V22.0)  20 yo G1P0 at [redacted]w[redacted]d  today based on new [facility] 03/27/11 determined from [redacted]w[redacted]d [place].  Labs: O +, Ab neg. Sickle cell neg.  H/H 11.6/33.5, Plt 280. GBS neg. HIV neg. Rubella immune. RPR neg. GC neg. Chlam neg. Varicella immune. Pap smear neg.    Plan: See pt instructions. f/u in 4 weeks.  f/u integrated screen results.    09/14/10-US measurement of fetal translucency and first blood sample.-Ultrasound results received: no gross abnormalities. IUP at 9+2. 10/05/10-second blood draw scheduled. 10/25/10-detailed anatomic ultrasound scheduled.   Orders: Medicaid OB visit - [facility] ([id number])  Problem # 2:  TOBACCO USER (ICD-305.1) Assessment: Improved Counselled on smoking cessation. Pt agrees to try to stop completely. Orders: Medicaid OB visit - [facility] ([id number])  Patient Instructions: 1)  [place], 2)  Thank you for coming in today.  3)  I will give you a call when I have the records from your ultrasound to give your exact due date. 4)  Make a f/u appt with me for 4 weeks. 5)  Continue to take your prenatal vitamins. 6)  Continue to cut down on your smoking, goal is zero. 7)  Good job getting your self set up with WIC.  8)  See you soon. 9)  -Dr. Armen Pickup   Orders Added: 1)  Medicaid OB visit - [facility] [[id number]]     Flowsheet View for Follow-up Visit    Estimated weeks of       gestation:     13 3/7    Weight:     104.4    Blood pressure:   119 / 74    Hx headache?  few    Nausea/vomiting?   nausea    Edema?     0    Bleeding?     no    Leakage/discharge?   d/c    Fetal activity:       no    Labor symptoms?   no    FHR:       140s    Taking Vitamins?   Y    Smoking PPD:   11    Comment:     1-3 cigarettes/day. Pt agrees to cut back to 0.    Next visit:     4 wk    Resident:     Armen Pickup    Preceptor:     Sheffield Slider  Prenatal Visit [facility] Confirmation:    New working [facility]: 03/27/2011    LMP reliable? NO Ultrasound Dating Information:    First U/S on 08/24/2010   Gest age: 9w 2d   EDC: 03/27/2011.   OB Initial Intake Information    Positive HCG by: office test 07/29/10    Race: Black    Marital status: Single    Occupation: unemployed    Education (last grade completed): 10th grade    Number of children at home: 0    Newborn's physician: Dr. Armen Pickup  FOB Information    Husband/Father of baby:  Rickey Barbara    FOB occupation unemployed  Menstrual History    LMP (date): 05/28/2010    Best Working EDC: 03/27/2011    LMP - Character: heavy    LMP - Reliable? : NO    Menarche: 7 years    Menses interval: 45 days (pt was on depo would miss shots at time. Last Depo shot in Feb 2011 days    Menstrual flow 5 days    On BCP's at conception: no    OB Ultrasound Data Entry   Ultrasound Date:     08/24/2010   Indication:       Uncert dates   Adnexa by [place]:     Normal         Fetal number:       1              Cardiac motion:     Yes            Fetal anomalies:     None noted   Measurements:   Crown-Rump Length (mm):   26.7         Dating:   Gest age by [place]:     9W 2D          [facility] by [place] Gest age:     03/27/2011   Best Working EDC:    03/27/2011

## 2010-12-21 NOTE — Miscellaneous (Signed)
Summary: cramps at bottom of stomach  Clinical Lists Changes states she is 2 month pregnant. c/o cramps that come & worse last night. not bleeding. this is her first pregnancy.  needs a note allowing her to stay out of her AA meetings. is to go daily. will go to jail if she does not go. c/o being tired alot. aslo has to go to her parole officer frequently. wants that also curtailed. told her unlikely as this is normal tiredness & changes in her body of a pregnancy. [phone] she does not work & has no other obligations other than these mandated meetings. told her I will check with her md & call her this afternoon with response.Golden Circle RN  August 03, 2010 11:50 AM   Attempted to call patient regarding this - patient was at [facility] meeting. Given that I was unable to talk to the patient I was unable to gauge the severity of her cramps or ask other pertinent questions. Patient should be advised to come in to be evaulated regarding these cramps if they worsen in intensity or frequency or if any bleeding is noted. Complaints of feeling tired are part of normal pregnancy - she SHOULD continue to attend both AA meetings and parole meetings as I believe these will be beneficial with regards to her pregnancy.   Dr. Armen Pickup will be providing prenatal care for this patient. I will route this note to her as well.  Bobby Rumpf  MD  August 03, 2010 1:58 PM

## 2010-12-21 NOTE — Assessment & Plan Note (Signed)
Summary: rash,df   Vital Signs:  Patient profile:   20 year old female Height:      63.25 inches Weight:      102.3 pounds BMI:     18.04 Temp:     98.5 degrees F oral Pulse rate:   76 / minute BP sitting:   107 / 72  (left arm) Cuff size:   regular  Vitals Entered By: Garen Grams LPN (Apr 07, 2010 10:01 AM) CC: rash Is Patient Diabetic? No Pain Assessment Patient in pain? no        Primary Care Provider:  Ancil Boozer  MD  CC:  rash.  History of Present Illness: rash: has been there for about 3 days.  woke up with it.  primarily on  arms and upper back.  very itchy and getting worse.  has tried cream/salve which only m ade it worse.  states she "can't stop itching."   worsened with bath/heat.  nothing seems to make it better.  hasn't tried any meds to help.  denies being out in woods, near animals with fleas (they have an inside dog), getting new furniture or furniture from the curb.  denies fevers.  her mother and sister are also itching but with different rashes.     Habits & Providers  Alcohol-Tobacco-Diet     Tobacco Status: current     Tobacco Counseling: to quit use of tobacco products  Current Medications (verified): 1)  Depo-Subq Provera 104 104 Mg/0.36ml Susp (Medroxyprogesterone Acetate) 2)  Hydroxyzine Hcl 50 Mg Tabs (Hydroxyzine Hcl) .Marland Kitchen.. 1 By Mouth Q6hr As Needed Severe Itching. 3)  Ranitidine Hcl 150 Mg Tabs (Ranitidine Hcl) .Marland Kitchen.. 1 By Mouth Two Times A Day For 2 Weeks Then As Needed. 4)  Triamcinolone Acetonide 0.1 % Oint (Triamcinolone Acetonide) .... Apply To Very Itchy Spots Two Times A Day As Needed. Disp 30g.  Allergies (verified): No Known Drug Allergies  Social History: sexually active.  lives with boyfriend (age 11) and his family.  is in contact with her mom, 5 sisters, niece.  Smokes 1/2 ppd now of cigarettes.  denies drug or etoh use.  currently between 10-11th grade.  looking for job.   sister: Radford Pax, cherish Rhett.  mother  seondra cummingsSmoking Status:  current  Review of Systems       per HPI  Physical Exam  General:  Well-developed,well-nourished,in no acute distress; alert,appropriate and cooperative throughout examination VS reviewed Skin:  multiple areas on arms and upper back of erythematous papules on an erythematous base with signs of excoriation consistent with insect bites.    Impression & Recommendations:  Problem # 1:  UNSPECIFIED PRURITIC DISORDER (ICD-698.9) Assessment New  most likely this is from insect bites though sore of insects we cannot currently track down.  for now, rx with H1 and H2 blockade + cream and try to find insect of cause.  return if worsens.  cut fingernails  Orders: [facility]- Est Level  3 (99213)  Complete Medication List: 1)  Depo-subq Provera 104 104 Mg/0.8ml Susp (Medroxyprogesterone acetate) 2)  Hydroxyzine Hcl 50 Mg Tabs (Hydroxyzine hcl) .Marland Kitchen.. 1 by mouth q6hr as needed severe itching. 3)  Ranitidine Hcl 150 Mg Tabs (Ranitidine hcl) .Marland Kitchen.. 1 by mouth two times a day for 2 weeks then as needed. 4)  Triamcinolone Acetonide 0.1 % Oint (Triamcinolone acetonide) .... Apply to very itchy spots two times a day as needed. disp 30g.  Patient Instructions: 1)  Look out for what might be biting  you as your rash looks like bites. 2)  Use the ranitidine every day for the next 2 weeks. Use the hydroxyzine as needed in addition to this for itching.  There is also a cream for the very itchy spots. 3)  You should also keep greasy like your sister.  Prescriptions: TRIAMCINOLONE ACETONIDE 0.1 % OINT (TRIAMCINOLONE ACETONIDE) apply to very itchy spots two times a day as needed. Disp 30g.  #30 x 1   Entered and Authorized by:   Ancil Boozer  MD   Signed by:   Ancil Boozer  MD on 04/07/2010   Method used:   Faxed to ...       [facility] (retail)       [address]       [place], [place]  [address]       Ph: [phone]       Fax: [phone]    RxID:   [id number] RANITIDINE HCL 150 MG TABS (RANITIDINE HCL) 1 by mouth two times a day for 2 weeks then as needed.  #60 x 1   Entered and Authorized by:   Ancil Boozer  MD   Signed by:   Ancil Boozer  MD on 04/07/2010   Method used:   Faxed to ...       [facility] (retail)       [address]       [place], [place]  [address]       Ph: [phone]       Fax: [phone]   RxID:   [id number] HYDROXYZINE HCL 50 MG TABS (HYDROXYZINE HCL) 1 by mouth q6hr as needed severe itching.  #60 x 1   Entered and Authorized by:   Ancil Boozer  MD   Signed by:   Ancil Boozer  MD on 04/07/2010   Method used:   Faxed to ...       [facility] (retail)       [address]       [place], [place]  [address]       Ph: [phone]       Fax: [phone]   RxID:   [id number]

## 2010-12-23 NOTE — Progress Notes (Signed)
Summary: letter  Phone Note Call from Patient Call back at [phone]   Summary of Call: pt needs letter for dentist stating its ok for her to have a cleaning, she is aprox. 5 months pregnant Initial call taken by: Knox Royalty,  December 01, 2010 11:53 AM  Follow-up for Phone Call        pt is waiting for the letter before she makes appt to dentist and needs to know asap Follow-up by: De Nurse,  December 03, 2010 1:39 PM  Additional Follow-up for Phone Call Additional follow up Details #1::        Phone Call Completed. pt informed that it is fine for her to have her teeth cleaned. Pt's mother will pick-up letter on Friday A.M. Additional Follow-up by: Dessa Phi MD,  December 07, 2010 4:33 PM      Flowsheet View for Follow-up Visit    Estimated weeks of       gestation:     24 2/7

## 2010-12-23 NOTE — Assessment & Plan Note (Signed)
Summary: F/U RH   Vital Signs:  Patient profile:   20 year old female Weight:      110 pounds Pulse rate:   69 / minute BP sitting:   107 / 69  (left arm) Cuff size:   regular  Vitals Entered By: Kaitlyn Grams LPN (November 17, 2010 8:47 AM)  Primary Chelsie Burel:  Bobby Rumpf  MD   History of Present Illness: Pt here for f/u OB visit.   20 yo G1 @ 21 w 3 d based off TI U/S. Labs: O +, Ab neg. Sickle cell neg.  H/H 11.6/33.5, Plt 280. GBS neg. HIV neg. Rubella immune. RPR neg. GC neg. Chlam neg. Varicella immune. Pap smear neg.   Complaints/Concerns  R sided back pain- x 1 week, constant 10/10 at worse, worse at night when lying on back and both sides. Ocassionally radiating to hip. Not helped with Tylenol PM.   Asked about urinary symptoms- pt reports ocassional suprapubic pressure when she urinates, denies dysuria, increased frequency, denies N/V, fever.  Has vaginal D/C same since beginning of pregnancy.   For other ROS see OB flowsheet.   Allergies: No Known Drug Allergies  Physical Exam  General:  normal appearance and healthy-appearing.   Abdomen:  Gravid. Non tender.   Detailed Back/Spine Exam  Gait:    Normal heel-toe gait pattern bilaterally.    Skin:    Intact with no erythema; no scarring.    Palpation:    Moderately TTP along R back.    Impression & Recommendations:  Problem # 1:  SUPERVISION OF NORMAL FIRST PREGNANCY (ICD-V22.0)  20 yo primigravida at 3 w 3 d  today based on  T1 [place].  Labs: O +, Ab neg. Sickle cell neg.  H/H 11.6/33.5, Plt 280. GBS neg. HIV neg. Rubella immune. RPR neg. GC neg. Chlam neg. Varicella immune. Pap smear neg.   Continue routine PNC.  F/U in 4 weeks.   Orders: Medicaid OB visit - [facility] ([id number])  Problem # 2:  FLANK PAIN, RIGHT (ICD-789.09) R sided back and flank pain that is mostly likely MSK,  however will obtain urine for UA and Ucx to be sure this is not pyelo. Pyelo unlikley given pt does not endorse dysuria, N/V,  fever. See pt instructions for plan regarding MSK back pain, inculding maternity belt and muscle relaxant.  Her updated medication list for this problem includes:    Cyclobenzaprine Hcl 5 Mg Tabs (Cyclobenzaprine hcl) .Marland Kitchen... Take one to two tabs at night before bed as needed for back pain.  Orders: Urine Culture-[facility] ([id number]) Urinalysis-[facility] (00000) Medicaid OB visit - [facility] ([id number])  Problem # 3:  UNSPECIFIED SLEEP DISTURBANCE (ICD-780.50) Assessment: Unchanged Pt contributes some of her poor sleep to her back pain.  Plan to treat/improve pain and reasses sleep at f/u visit.   Problem # 4:  bacterial vaginosis Clue cells on UA. Most likely cause of persistent discharge Flagyl x 7 days. Diflucan as yeast infection PPX. Will call scripts in to pt's pharmacy.   Complete Medication List: 1)  Calna Tabs (Prenatal vitamins) .... One tab q day 2)  Cyclobenzaprine Hcl 5 Mg Tabs (Cyclobenzaprine hcl) .... Take one to two tabs at night before bed as needed for back pain. 3)  Metronidazole 500 Mg Tabs (Metronidazole) .... One tab twice daily for next 7 days. 4)  Fluconazole 150 Mg Tabs (Fluconazole) .... Take one tab to prevent yeast infection.  Patient Instructions: 1)  Tryphena, 2)  Thank you for coming  in today. 3)  For your back pain: It sounds like musculoskeletal pain. Please buy and wear the materninty belt also take the flexeril at night. We will check your urine today just to be sure it is not an infection. 4)  Continue to take your vitamins and drink plenty of water.  5)  Look out for regular contractions, leakage of fluid from your vagina and vaginal bleeding. All reasons to call the clinic and go to [facility] hospital. 6)  F/u In 4  weeks. 7)  -Dr. Armen Pickup Prescriptions: FLUCONAZOLE 150 MG TABS (FLUCONAZOLE) take one tab to prevent yeast infection.  #1 x 0   Entered and Authorized by:   Dessa Phi MD   Signed by:   Dessa Phi MD on 11/17/2010   Method used:    Telephoned to ...       [facility] (retail)       [address]       [place], [place]  [address]       Ph: [phone]       Fax: [phone]   RxID:   [id number] CALNA  TABS (PRENATAL VITAMINS) one tab q day  #90 x 3   Entered and Authorized by:   Dessa Phi MD   Signed by:   Dessa Phi MD on 11/17/2010   Method used:   Telephoned to ...       [facility] (retail)       [address]       [place], [place]  [address]       Ph: [phone]       Fax: [phone]   RxID:   [id number] METRONIDAZOLE 500 MG TABS (METRONIDAZOLE) one tab twice daily for next 7 days.  #14 x 0   Entered and Authorized by:   Dessa Phi MD   Signed by:   Dessa Phi MD on 11/17/2010   Method used:   Telephoned to ...       [facility] (retail)       [address]       [place], [place]  [address]       Ph: [phone]       Fax: [phone]   RxID:   [id number] CYCLOBENZAPRINE HCL 5 MG TABS (CYCLOBENZAPRINE HCL) Take one to two tabs at night before bed as needed for back pain.  #60 x 0   Entered and Authorized by:   Dessa Phi MD   Signed by:   Dessa Phi MD on 11/17/2010   Method used:   Print then Give to Patient   RxID:   [id number] CYCLOBENZAPRINE HCL 5 MG TABS (CYCLOBENZAPRINE HCL) Take one to two tabs at night before bed as needed for back pain.  #30 x 1   Entered and Authorized by:   Dessa Phi MD   Signed by:   Dessa Phi MD on 11/17/2010   Method used:   Print then Give to Patient   RxID:   [id number]    Orders Added: 1)  Urine Culture-[facility] [[id number]] 2)  Urinalysis-[facility] [00000] 3)  Medicaid OB visit - [facility] [[id number]]     Flowsheet View for Follow-up Visit    Estimated weeks of       gestation:     21 3/7    Weight:  110    Blood pressure:   107 / 69    Urine protein:       30    Urine glucose:    negative    Urine  nitrite:     negative    Hx headache?     few    Nausea/vomiting?   No    Bleeding?     no    Leakage/discharge?   no    Fetal activity:       yes    Fundal height:      18 cm    FHR:       155    Taking Vitamins?   Y    Smoking PPD:   n/a    Comment:     smoke a few puffs a day    Next visit:     4 wk    Resident:     Armen Pickup    Preceptor:     [place]   Laboratory Results   Urine Tests  Date/Time Received: November 17, 2010 9:25 AM  Date/Time Reported: November 17, 2010 9:45 AM   Routine Urinalysis   Color: yellow Appearance: slightly Cloudy Glucose: negative   (Normal Range: Negative) Bilirubin: negative   (Normal Range: Negative) Ketone: negative   (Normal Range: Negative) Spec. Gravity: >=1.030   (Normal Range: 1.003-1.035) Blood: negative   (Normal Range: Negative) pH: 6.5   (Normal Range: 5.0-8.0) Protein: 30   (Normal Range: Negative) Urobilinogen: 0.2   (Normal Range: 0-1) Nitrite: negative   (Normal Range: Negative) Leukocyte Esterace: moderate   (Normal Range: Negative)  Urine Microscopic WBC/HPF: 10-20 RBC/HPF: 0-3 Bacteria/HPF: 3+ rods and cocci Epithelial/HPF: >20 Crystals/HPF: few calcium oxalate Other: clue cells present    Comments: ...............test performed by......Marland KitchenBonnie A. [place], MLS (ASCP)cm

## 2010-12-23 NOTE — Assessment & Plan Note (Signed)
Summary: ob/kh   Vital Signs:  Patient profile:   20 year old female Height:      63.25 inches Weight:      107.3 pounds BMI:     18.93 Temp:     98.3 degrees F oral Pulse rate:   98 / minute BP sitting:   117 / 71  (right arm) Cuff size:   regular  Vitals Entered By: Jimmy Footman, CMA (November 02, 2010 2:39 PM) CC: OB visit   19  2/7 Is Patient Diabetic? No Pain Assessment Patient in pain? no        Primary Provider:  Bobby Rumpf  MD  CC:  OB visit   19  2/7.  History of Present Illness: Pt here for f/u OB visit.  Intergrated Screen: negative EDC based on T1 [place] 03/27/11 Anatomy US 10/25/10- female fetus, EFW 56 %, Anterior placenta above cervical os, Amniotic fluid-subjectively normal. normal anatomy except for LV echogenic focus (fetal heart). Recommendations: follow-up as clinically indicated.   Concerns: 1. PNV- needs a scrpt 2. ? restarting Depakote- Pt previously on Depakote ER for bipolar disorder. Has a strong family hx - mother, aunt, sister. Pt D/C the medication (04/07/10)  because she said taking it made her feel lazy. 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 Her probation Paediatric nurse suggested she restart medication for her mood. She does admit to occassional outburst of anger.   3. Insomnia- pt reports poor sleep x 4 weeks. She get about 5 hrs of interrupted sleep per night. She states that she previously slept 9 hrs/night from 10 PM - 7 AM. She denies be awoken by discomfort, hunger, the desire relieve bowel/bladder. She states that her home is peaceful.   Preventive Screening-Counseling & Management  Alcohol-Tobacco     Smoking Status: current     Packs/Day: n/a  Allergies: No Known Drug Allergies  Social History: Packs/Day:  n/a  Physical Exam  General:  normal appearance and healthy-appearing.   Abdomen:  Gravid. Non tender.   Impression & Recommendations:  Problem # 1:  SUPERVISION OF NORMAL FIRST  PREGNANCY (ICD-V22.0) 20 yo primigravida at 34 w 2 d  today based on  T1 [place].  Labs: O +, Ab neg. Sickle cell neg.  H/H 11.6/33.5, Plt 280. GBS neg. HIV neg. Rubella immune. RPR neg. GC neg. Chlam neg. Varicella immune. Pap smear neg.  See OB flowsheet for details.  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  Has strong family history of bipolar disorder and was previously treated with Depakote. Discussed with Dr. Jennette Kettle who recommends following closely and having pt keep a sleep diary in regards to insomnia if symptoms persist will likely have to refer pt to [facility] clinic for treatment of mood disorder during pregnancy. Pt made aware of this possibility.  Pt declines therpay (behavioral health/meeting with Dr. Pascal Lux) she does not feel that therapy will be helpful.   Problem # 3:  UNSPECIFIED SLEEP DISTURBANCE (ICD-780.50) Poor sleep pattern concerning for impending insomnia. Will have pt keep sleep diary and try behaviors to improve sleep hygiene.  F/u in 2 weeks.   Problem # 4:  GOITER, UNSPECIFIED (ICD-240.9) Pt has history of goiter.  Nml TSH in past 2.108 in 11/20110. Plan to recheck TSH as hperthyroidism may present with hyperactivit/mood lability/poor sleep.  Orders:  TSH-[facility] [phone])  Complete Medication List: 1)  Calna Tabs (Prenatal vitamins) .... One tab q day  Patient Instructions: 1)  Maciel, 2)  Thank you for coming in today. 3)  Congrats on the baby boy! 4)  For your poor sleep: 5)  Keep a sleep diary: 6)  Sleep time(s): 7)  Wake time(s): 8)  # of hours asleep: 9)  No caffeine (sodas, chocolates, coffee at night especially before bed). If you cannot fall asleep after laying down for 20 minutes leave the room and try reading/doing a puzzle until you are sleepy again. 10)  F/u in 2 weeks. 11)  I look  forward to seeing you again. 12)  Happy Holidays! 13)  Dr. Armen Pickup   Orders Added: 1)  TSH-[facility] [[id number]] 2)  Medicaid OB visit - [facility] [[id number]]     Flowsheet View for Follow-up Visit    Estimated weeks of       gestation:     19 2/7    Weight:     107.3    Blood pressure:   117 / 71    Hx headache?     daily    Nausea/vomiting?   nausea    Edema?     0    Bleeding?     no    Leakage/discharge?   d/c    Fetal activity:       yes    Labor symptoms?   no    FHR:       150    Taking Vitamins?   Y    Smoking PPD:   n/a    Comment:     1 cigarette a day    Next visit:     4 wk    Resident:     Armen Pickup    Preceptor:     Lawanda Cousins Initial Intake Information    Positive HCG by: office test 07/29/10    Race: Black    Marital status: Single    Occupation: unemployed    Education (last grade completed): 10th grade    Number of children at home: 0    Newborn's physician: Dr. Armen Pickup  FOB Information    Husband/Father of baby: Rickey Barbara    FOB occupation unemployed  Menstrual History    LMP (date): 05/28/2010    LMP - Character: heavy    Menarche: 7 years    Menses interval: 45 days (pt was on depo would miss shots at time. Last Depo shot in Feb 2011 days    Menstrual flow 5 days    On BCP's at conception: no   Prenatal Visit EDC Confirmation: Ultrasound Dating Information:    Second U/S on 10/25/2010   Gest age: 46 w 4 d   EDC: 03/24/2011.

## 2010-12-23 NOTE — Progress Notes (Signed)
Summary: Rx not at pharmacy  Phone Note Call from Patient Call back at [phone]   Summary of Call: pt checking status of rx for prenatal vitamins, suppose to be sent to [facility]. Initial call taken by: Knox Royalty,  November 03, 2010 9:02 AM  Follow-up for Phone Call        Called pt - advised will ask Dr. Armen Pickup to send prenatal vitamins.  Pt requests to be called when they have been sent in so she can pick them up. Follow-up by: Rochele Pages RN,  November 03, 2010 10:25 AM    New/Updated Medications: CALNA  TABS (PRENATAL VITAMINS) one tab q day Prescriptions: CALNA  TABS (PRENATAL VITAMINS) one tab q day  #90 x 3   Entered and Authorized by:   Dessa Phi MD   Signed by:   Dessa Phi MD on 11/03/2010   Method used:   Telephoned to ...       [facility] (retail)       [address]       [place], [place]  [address]       Ph: [phone]       Fax: [phone]   RxID:   [id number]

## 2010-12-23 NOTE — Letter (Signed)
Summary: *Referral Letter  [facility]  [address]   [place], [place] [address]   Phone: [phone]  Fax: [phone]    12/07/2010  Thank you in advance for agreeing to see my patient:  Kaitlyn Hunt [address] [place], [place]  [address]  Phone: [phone]  Ms. Bach is medically cleared to have her teeth cleaned.     Thank you again for agreeing to see our patient; please contact us if you have any further questions or need additional information.  Sincerely,  Dessa Phi MD

## 2010-12-23 NOTE — Assessment & Plan Note (Signed)
Summary: 1 MOS F/U PER MD/RH   Vital Signs:  Patient profile:   20 year old female Weight:      115.2 pounds Pulse rate:   88 / minute BP sitting:   122 / 72  Vitals Entered By: Garen Grams LPN (December 13, 2010 10:45 AM)  Primary Provider:  Bobby Rumpf  MD   History of Present Illness: 20 yo primigravida, here for f/u OB visit 25 weeks and 1 day.  EDC 03/27/11, based on [redacted]w[redacted]d U/S.  Accompanied by FOB: Rickey Barbara.  Patient concerns: none.  Provider concerns:  1. Back pain-improved. low back. worse at night. pt has not yet picked up belly belt.  2. Vaginal discharge-persistent. same as before. No vaginal itching or irritation. Pt has yet to pick up script for BV (Flagyl and Diflucan).  3. Smoking- none today. Typically 3-4/day. Smokes when stressed/bored.  4. Sleep- unchanged from before.  5. Mood- Somewhat improved.   For ROS, see OB flowsheet.   Allergies: No Known Drug Allergies  Social History: Packs/Day:  3-4/day  Physical Exam  General:  normal appearance and healthy-appearing.   Vitals reviewed. WNL.  Abdomen:  Gravid FH-21.5 cm. Nontender. No fetal movement on exam. FHT-150.  Extremities:  No edema.    Impression & Recommendations:  Problem # 1:  SUPERVISION OF NORMAL FIRST PREGNANCY (ICD-V22.0) A:  20 yo primigravida at 18 w 1 d  today based on  T1 [place].  Growth on schedule, no red flags in ROS.  Labs: O +, Ab neg. Sickle cell neg.  H/H 11.6/33.5, Plt 280. GBS neg. HIV neg. Rubella immune. RPR neg. GC neg. Chlam neg. Varicella immune. Pap smear neg.   P:  Continue routine PNC.  Treat BV- with Flagyl and Diflucan.  F/U in 3 weeks-OB clinic. 1 hr glucola at this f/u visit. Ordered. F/u in 5 weeks with me.  Female baby In-patient-circ Feeding: bottle Baby's MD: TBD Contraception: TBD.     Orders: Medicaid OB visit - [facility] ([id number])Future Orders: Glucose 1 hr-[facility] ([id number]) ... 01/20/2011  Complete Medication List: 1)  Calna Tabs (Prenatal  vitamins) .... One tab q day 2)  Cyclobenzaprine Hcl 5 Mg Tabs (Cyclobenzaprine hcl) .... Take one to two tabs at night before bed as needed for back pain.  Patient Instructions: 1)  Dima, 2)  Thank you for coming in today. 3)  For your back pain: I'm happy to hear that it is a little better. I do believe that the maternity belt will help/  4)  Continue to take your prenatal vitamins and drink plenty of water.  5)  Do your best (for you and your baby) not to smoke.  6)  What to watch out for (signs of preterm labor) Contraction (4 or more and hour for 2 hours), leakage of fluid from your vagina and vaginal bleeding. All reasons to call the clinic and go to [facility] hospital for a check-up. 7)  F/u In 3  weeks, at [facility] clinic. Will check 1 hr glucola at this visit. 8)  F/u in 5 weeks with me.  9)  -Dr. Armen Pickup    Orders Added: 1)  Glucose 1 hr-[facility] [[id number]] 2)  Medicaid OB visit - [facility] [[id number]]     OB Initial Intake Information    Positive HCG by: office test 07/29/10    Race: Black    Marital status: Single    Occupation: unemployed    Education (last grade completed): 10th grade    Number of  children at home: 0    Newborn's physician: Dr. Armen Pickup  FOB Information    Husband/Father of baby: Rickey Barbara    FOB occupation unemployed  Menstrual History    LMP (date): 05/28/2010    LMP - Character: heavy    Menarche: 7 years    Menses interval: 45 days (pt was on depo would miss shots at time. Last Depo shot in Feb 2011 days    Menstrual flow 5 days    On BCP's at conception: no   Flowsheet View for Follow-up Visit    Estimated weeks of       gestation:     25 1/7    Weight:     115.2    Blood pressure:   122 / 72    Headache:     few    Nausea/vomiting:   No    Edema:     0    Vaginal bleeding:   no    Vaginal discharge:   d/c    Fundal height:      21.5    FHR:       150    Fetal activity:     yes    Labor symptoms:   no    Taking prenatal vits?   Y    Smoking:      3-4/day    Next visit:     3 wk    Resident:     [facility]    Flowsheet View for Follow-up Visit    Estimated weeks of       gestation:     25 1/7    Weight:     115.2    Blood pressure:   122 / 72    Hx headache?     few    Nausea/vomiting?   No    Edema?     0    Bleeding?     no    Leakage/discharge?   d/c    Fetal activity:       yes    Labor symptoms?   no    Fundal height:      21.5    FHR:       150    Taking Vitamins?   Y    Smoking PPD:   3-4/day    Next visit:     3 wk    Resident:     Jayjay Littles   OB Initial Intake Information    Positive HCG by: office test 07/29/10    Race: Black    Marital status: Single    Occupation: unemployed    Education (last grade completed): 10th grade    Number of children at home: 0    Newborn's physician: Dr. Armen Pickup  FOB Information    Husband/Father of baby: Rickey Barbara    FOB occupation unemployed  Menstrual History    LMP (date): 05/28/2010    LMP - Character: heavy    Menarche: 7 years    Menses interval: 45 days (pt was on depo would miss shots at time. Last Depo shot in Feb 2011 days    Menstrual flow 5 days    On BCP's at conception: no      OB Ultrasound Data Entry   Ultrasound Date:     10/25/2010   Indication:       Anatomy        Cervical length:     3 cm  Adnexa by [place]:     Normal         Fetal number:       1              Fetal position:       Cephalic       Cardiac motion:     Yes            Fetal anomalies:     Echogenic fo   Placenta location:     Anterior, ab   Placenta previa?     Yes          Measurements:   Crown-Rump Length (mm):   26.7           Bi-Parietal diam (cm):     4.2            Head circumference (cm):   15.3           Abdominal circ (cm):     12.5           Femur length (cm):     2.9            HC/AC ratio:       1.22           FL/BPD ratio:       .69            Est Fetal Weight (g):     247          Dating:   Gest age by [place]:     18W 4D         EDC by [place] Gest  age:     03/24/2011   Impression:   Impression:       Best GA [redacted]w[redacted]d, EDC still 03/27/11   EDC by sonogram consistent with current EDC. [facility]:    03/27/2011

## 2011-01-02 ENCOUNTER — Encounter: Payer: Self-pay | Admitting: *Deleted

## 2011-01-05 ENCOUNTER — Other Ambulatory Visit: Payer: Self-pay | Admitting: Family Medicine

## 2011-01-05 DIAGNOSIS — Z331 Pregnant state, incidental: Secondary | ICD-10-CM

## 2011-01-05 DIAGNOSIS — Z3400 Encounter for supervision of normal first pregnancy, unspecified trimester: Secondary | ICD-10-CM | POA: Insufficient documentation

## 2011-01-06 ENCOUNTER — Ambulatory Visit (INDEPENDENT_AMBULATORY_CARE_PROVIDER_SITE_OTHER): Payer: Self-pay | Admitting: Family Medicine

## 2011-01-06 ENCOUNTER — Encounter: Payer: Self-pay | Admitting: Family Medicine

## 2011-01-06 VITALS — BP 122/77 | Wt 118.2 lb

## 2011-01-06 DIAGNOSIS — Z3480 Encounter for supervision of other normal pregnancy, unspecified trimester: Secondary | ICD-10-CM

## 2011-01-06 DIAGNOSIS — Z331 Pregnant state, incidental: Secondary | ICD-10-CM

## 2011-01-06 NOTE — Progress Notes (Signed)
Subjective:    Kaitlyn Hunt is a 20 y.o. female being seen today for her obstetrical visit. She is at [redacted]w[redacted]d gestation. Patient reports no complaints. Fetal movement: normal.  She reports no problems with mood control for her bipolar disorder.  She has not taken the Depakote that she had previously been given for this; reports that she does not intend to take it during pregnancy.  Is smoking 1/2 ppd during pregnancy.  Would like to quit.  Denies alcohol or drugs.  Is here with FOB today.  Reports that she is excited about the pregnancy and having the baby. Takes PNVs only; does not take any other meds or supplements.  Objective:    BP 122/77  Wt 118 lb 3 oz (53.609 kg)  Physical Exam  Exam  FHT:  140 BPM  Uterine Size: size equals dates  Presentation: unsure     Assessment:    Pregnancy:  G1P0    Plan:    Patient Active Problem List  Diagnoses  . GOITER, UNSPECIFIED  . UNSPECIFIED EPISODIC MOOD DISORDER  . TOBACCO USER  . ATTENTION DEFICIT, W/HYPERACTIVITY  . HEADACHE  . SYSTOLIC MURMUR  . PROBLEMS, BEHAVIORAL NEC  . UNSPECIFIED VAGINITIS AND VULVOVAGINITIS  . UNSPECIFIED SLEEP DISTURBANCE  . FLANK PAIN, RIGHT  . Pregnancy as incidental finding    Cigarette smoking: counseled on quitting. Is due for 28-week labs, including 1hGTT, HIV, RPR and CBC.  Will schedule them in the coming week.  Follow up in 2 Weeks.

## 2011-01-06 NOTE — Patient Instructions (Signed)
It was a pleasure to see you today in OB clinic.  Your baby's measurements and heart rate are excellent for your gestational age (28 weeks 4 days).  Keep taking the prenatal vitamins.   We would like to help you to quit smoking if possible (for your health and your baby's health).  I am ordering a check of your sugar (takes 1 hour) and your blood count and HIV/syphilis test.  Please make a lab appointment for these.   PLEASE MAKE A FOLLOW UP APPOINTMENT WITH DR Armen Pickup IN 2 WEEKS.  Kick Count Fetal Movement Counts   Kick counts is highly recommended in high risk pregnancies, but it is a good idea for every pregnant woman to do. Start counting fetal movements at 28 weeks of the pregnancy. Fetal movements increase after eating a full meal or eating or drinking something sweet (the blood sugar is higher). It is also important to drink plenty of fluids (well hydrated) before doing the count. Lie on your left side because it helps with the circulation or you can sit in a comfortable chair with your arms over your belly (abdomen) with no distractions around you.   DOING THE COUNT:  Try to do the count the same time of day each time you do it.  Mark the day and time, then see how long it takes for you to feel 10 movements (kicks, flutters, swishes, rolls). You should have at least 10 movements within 2 hours. You will most likely feel 10 movements in much less than 2 hours. If you do not, wait an hour and count again. After a couple of days you will see a pattern.  What you are looking for is a change in the pattern or not enough counts in 2 hours. Is it taking longer in time to reach 10 movements?   SEEK MEDICAL CARE IF:  You feel less than 10 counts in 2 hours. Tried twice.  No movement in one hour.  The pattern is changing or taking longer each day to reach 10 counts in 2 hours.  You feel the baby is not moving as it usually does.     Date   Movements Start time Kaitlyn Hunt time   Date    Movements Start time [employer]  time      Sun           Sun        W   Mon       W   Mon        E   Tue       E   Tue        E   Wed       E   Wed        K   Kaitlyn Hunt            Fri           Fri            Sat           Sat            Sun           Sun        W   Mon       W   Mon        E   Tue       E  Tue        E   Wed       E   Wed        K   Thur       K   Kaitlyn Hunt            Fri           Fri            Sat           Sat            Sun           Sun        W   Mon       W   Mon        E   Tue       E   Tue        E   Wed       E   Wed        K   Kaitlyn Hunt       K   Kaitlyn Hunt            Fri           Fri            GNF           Sat            Sun           Sun        W   Mon       W   Mon        E   Tue       E   Tue        E   Wed       E   Wed        K   Kaitlyn Hunt       K   Kaitlyn Hunt            Fri           Fri            Sat           Sat         Preterm Labor, Home Care    Preterm labor is when a pregnant woman has contractions that cause the cervix to open, shorten and thin before 37 weeks of pregnancy. You will have regular contractions (tightening) 2 to 3 minutes apart. This usually causes discomfort or pain.   HOME CARE    Eat a healthy diet.  Take your vitamins as told by your doctor.  Drink 6 to 8 glasses of water a day.  Get rest and sleep.  Do not have sex if you have or are at high risk for preterm labor.   Follow your doctor's advice about activity, medicines and tests.  Avoid stress.  Avoid hard labor or exercise that lasts for a long time.  Do not smoke.   GET HELP IF:    You are having contractions.  You have belly (abdominal) pain.  You have bleeding from your vagina.  You have pain when you pee (urinate).  You have abnormal discharge from your vagina.  You develop a fever over 102 F (38.9 C) or higher.       Document Released: 02/03/2009   [facility] Patient Information 2011 [employer]. Document Released: 12/07/2006   Document Re-Released: 11/29/2009 [facility] Patient Information 2011 [place], [place].

## 2011-01-19 ENCOUNTER — Other Ambulatory Visit: Payer: Self-pay | Admitting: *Deleted

## 2011-01-19 ENCOUNTER — Other Ambulatory Visit: Payer: Self-pay

## 2011-01-19 ENCOUNTER — Ambulatory Visit (INDEPENDENT_AMBULATORY_CARE_PROVIDER_SITE_OTHER): Payer: Medicaid Other | Admitting: Family Medicine

## 2011-01-19 ENCOUNTER — Other Ambulatory Visit: Payer: Medicaid Other

## 2011-01-19 VITALS — BP 124/78 | Wt 123.6 lb

## 2011-01-19 DIAGNOSIS — Z331 Pregnant state, incidental: Secondary | ICD-10-CM

## 2011-01-19 DIAGNOSIS — Z3480 Encounter for supervision of other normal pregnancy, unspecified trimester: Secondary | ICD-10-CM

## 2011-01-19 LAB — CBC
Hemoglobin: 12.3 g/dL (ref 12.0–15.0)
MCH: 29.7 pg (ref 26.0–34.0)
MCHC: 34.4 g/dL (ref 30.0–36.0)
Platelets: 215 10*3/uL (ref 150–400)
RDW: 13.2 % (ref 11.5–15.5)

## 2011-01-19 LAB — CONVERTED CEMR LAB
HIV: NONREACTIVE
MCHC: 34.4 g/dL (ref 30.0–36.0)
Platelets: 215 10*3/uL (ref 150–400)
RDW: 13.2 % (ref 11.5–15.5)

## 2011-01-19 LAB — GLUCOSE, CAPILLARY: Glucose-Capillary: 123 mg/dL — ABNORMAL HIGH (ref 70–99)

## 2011-01-19 LAB — RPR

## 2011-01-19 MED ORDER — CYCLOBENZAPRINE HCL 5 MG PO TABS: 5.0000 mg | ORAL_TABLET | Freq: Every evening | ORAL | Status: DC

## 2011-01-19 NOTE — Patient Instructions (Signed)
Kaitlyn Hunt,  Thank you for coming in today. It  appears that you and baby boy are growing and developing well.  For you leg pain, it is natural to have aches and pains at this stage. Try the flexeril at night to help. I will send the refill to your pharmacy.   Please watch out for signs of preterm labor: vaginal bleeding, leakage or gush of fluids, 4 or more contractions for 2 or more hours. Please call the clinic if any of these occur and go to [facility].

## 2011-01-19 NOTE — Progress Notes (Signed)
  Subjective:    Patient ID: Kaitlyn Hunt, female    DOB: [birth year], 20 y.o.   MRN: [id number]  HPI 20 yo primigravida here for f/u OB visit. 30 w 3 d by T1  U/S.  Complaints/concerns: 1. Still not sleeping very well- able to keep appointments and perform daily activities.  2. B/L thigh pain- worse on R. Worse at night. Helped with flexeril at night. Pt denies CTX/ VB. Also denies N/V. Reports positive fetal movement.     Review of Systems Pertinent ROS as per OB flowsheet and HPI.      Objective:   Physical Exam  Constitutional: Vital signs are normal. She appears well-developed and well-nourished.  Abdominal: Soft. Normal appearance.  Abdomen: Gravid, postive fetal movement on exam. FH 25 cm,         Assessment & Plan:  20 yo primigravida at [redacted]w[redacted]d today based on T1 [place]. Reassuring exam.  Labs: O +, Ab neg. Sickle cell neg. H/H 12.3/35.8, Plt 215. GBS neg. HIV neg. Rubella immune. RPR neg. GC neg. Chlam neg. Varicella immune. Pap smear neg.  Continue routine PNC. For b/l hip pain that sounds like round ligament pain- will continue flexeril at night PRN.   Female baby: Zy'kerion F/u in 2 weeks

## 2011-01-20 LAB — HIV ANTIBODY (ROUTINE TESTING W REFLEX): HIV: NONREACTIVE

## 2011-01-24 ENCOUNTER — Encounter: Payer: Self-pay | Admitting: Family Medicine

## 2011-01-26 ENCOUNTER — Encounter: Payer: Self-pay | Admitting: Family Medicine

## 2011-01-27 NOTE — Assessment & Plan Note (Signed)
20 yo primigravida at [redacted]w[redacted]d today based on T1 [place]. Reassuring exam.  Labs: O +, Ab neg. Sickle cell neg. H/H 12.3/35.8, Plt 215. GBS neg. HIV neg. Rubella immune. RPR neg. GC neg. Chlam neg. Varicella immune. Pap smear neg.  Continue routine PNC.  For b/l hip pain that sounds like round ligament pain- will continue flexeril at night PRN.  Female baby: Kaitlyn Hunt  F/u in 2 weeks

## 2011-02-02 ENCOUNTER — Telehealth: Payer: Self-pay | Admitting: *Deleted

## 2011-02-02 ENCOUNTER — Inpatient Hospital Stay (HOSPITAL_COMMUNITY)
Admission: AD | Admit: 2011-02-02 | Discharge: 2011-02-02 | Disposition: A | Discharge status: Home / Self Care | Payer: Medicaid Other | Source: Ambulatory Visit | Attending: Family Medicine | Admitting: Family Medicine

## 2011-02-02 ENCOUNTER — Inpatient Hospital Stay (HOSPITAL_COMMUNITY): Payer: Medicaid Other

## 2011-02-02 ENCOUNTER — Ambulatory Visit (INDEPENDENT_AMBULATORY_CARE_PROVIDER_SITE_OTHER): Payer: Medicaid Other | Admitting: Family Medicine

## 2011-02-02 VITALS — BP 135/90 | Wt 124.0 lb

## 2011-02-02 DIAGNOSIS — B9689 Other specified bacterial agents as the cause of diseases classified elsewhere: Secondary | ICD-10-CM | POA: Insufficient documentation

## 2011-02-02 DIAGNOSIS — A499 Bacterial infection, unspecified: Secondary | ICD-10-CM | POA: Insufficient documentation

## 2011-02-02 DIAGNOSIS — O26849 Uterine size-date discrepancy, unspecified trimester: Secondary | ICD-10-CM

## 2011-02-02 DIAGNOSIS — Z3480 Encounter for supervision of other normal pregnancy, unspecified trimester: Secondary | ICD-10-CM

## 2011-02-02 DIAGNOSIS — R030 Elevated blood-pressure reading, without diagnosis of hypertension: Secondary | ICD-10-CM

## 2011-02-02 DIAGNOSIS — O2390 Unspecified genitourinary tract infection in pregnancy, unspecified trimester: Secondary | ICD-10-CM | POA: Insufficient documentation

## 2011-02-02 DIAGNOSIS — Z3400 Encounter for supervision of normal first pregnancy, unspecified trimester: Secondary | ICD-10-CM

## 2011-02-02 DIAGNOSIS — O365990 Maternal care for other known or suspected poor fetal growth, unspecified trimester, not applicable or unspecified: Secondary | ICD-10-CM | POA: Insufficient documentation

## 2011-02-02 DIAGNOSIS — O139 Gestational [pregnancy-induced] hypertension without significant proteinuria, unspecified trimester: Secondary | ICD-10-CM | POA: Insufficient documentation

## 2011-02-02 DIAGNOSIS — N760 Acute vaginitis: Secondary | ICD-10-CM | POA: Insufficient documentation

## 2011-02-02 LAB — COMPREHENSIVE METABOLIC PANEL
ALT: 15 U/L (ref 0–35)
BUN: 12 mg/dL (ref 6–23)
Calcium: 8.9 mg/dL (ref 8.4–10.5)
Glucose, Bld: 69 mg/dL — ABNORMAL LOW (ref 70–99)
Sodium: 134 mEq/L — ABNORMAL LOW (ref 135–145)
Total Protein: 6.5 g/dL (ref 6.0–8.3)

## 2011-02-02 LAB — URINE MICROSCOPIC-ADD ON

## 2011-02-02 LAB — POCT URINALYSIS DIPSTICK
Bilirubin, UA: NEGATIVE
Blood, UA: NEGATIVE
Nitrite, UA: NEGATIVE
Protein, UA: 30
Urobilinogen, UA: 1
pH, UA: 6.5

## 2011-02-02 LAB — PROTEIN / CREATININE RATIO, URINE: Total Protein, Urine: 22 mg/dL

## 2011-02-02 LAB — CBC
HCT: 37.3 % (ref 36.0–46.0)
Hemoglobin: 12.8 g/dL (ref 12.0–15.0)
MCH: 29.8 pg (ref 26.0–34.0)
MCV: 86.7 fL (ref 78.0–100.0)
Platelets: 194 10*3/uL (ref 150–400)
RBC: 4.3 MIL/uL (ref 3.87–5.11)

## 2011-02-02 LAB — POCT UA - MICROSCOPIC ONLY

## 2011-02-02 LAB — WET PREP, GENITAL: Trich, Wet Prep: NONE SEEN

## 2011-02-02 LAB — URINALYSIS, ROUTINE W REFLEX MICROSCOPIC
Hgb urine dipstick: NEGATIVE
Protein, ur: NEGATIVE mg/dL
Urobilinogen, UA: 0.2 mg/dL (ref 0.0–1.0)

## 2011-02-02 NOTE — Telephone Encounter (Signed)
Patient is being seen at [facility].  They called requesting that her paperwork be faxed to [phone].

## 2011-02-02 NOTE — Progress Notes (Signed)
Pt with slightly elevated BP, 30 protein in the urine, and is measuring small for her dates.  Will send to [facility] for evaluation. Subjective:    Kaitlyn Hunt is a 20 y.o. female being seen today for her obstetrical visit. She is at [redacted]w[redacted]d gestation. Patient reports vague RUQ pain.  No headache, vision changes, LE swelling. Fetal movement: normal.  Menstrual History: OB History    Grav Para Term Preterm Abortions TAB SAB Ect Mult Living   1               Menarche age:  Patient's last menstrual period was 05/28/2010.    The following portions of the patient's history were reviewed and updated as appropriate: allergies, current medications, past family history, past medical history, past social history, past surgical history and problem list.  Review of Systems Pertinent items are noted in HPI.   Objective:    BP 135/90  Wt 124 lb (56.246 kg)  LMP 05/28/2010 FHT:  150 BPM  Uterine Size: 27 cm  Presentation: unsure     Assessment:    Pregnancy 32 and 3/7 weeks   Plan:    28-week labs reviewed, normal  Follow up in Please go to [facility] for evaluation.

## 2011-02-02 NOTE — Telephone Encounter (Signed)
Records faxed to number provided

## 2011-02-02 NOTE — Progress Notes (Signed)
  Subjective:    Patient ID: Kaitlyn Hunt, female    DOB: 1991-02-03, 20 y.o.   MRN: [id number]  HPI    Review of Systems     Objective:   Physical Exam  Constitutional: She is oriented to person, place, and time. She appears well-nourished. No distress.  Eyes: Conjunctivae are normal. Pupils are equal, round, and reactive to light.       Normal fundoscopic   Neck: Normal range of motion. Neck supple.  Cardiovascular: Normal rate and regular rhythm.   Pulmonary/Chest: Effort normal and breath sounds normal. No respiratory distress.  Abdominal: Soft. Bowel sounds are normal. There is no tenderness. There is no rebound and no guarding.       gravid  Musculoskeletal: Normal range of motion. She exhibits no edema.  Neurological: She is alert and oriented to person, place, and time.  Skin: Skin is warm and dry. No erythema.  Psychiatric: She has a normal mood and affect.          Assessment & Plan:

## 2011-02-02 NOTE — Progress Notes (Signed)
Addended by: [place], Clifton Safley on: 02/02/2011 11:41 AM   Modules accepted: Orders

## 2011-02-02 NOTE — Assessment & Plan Note (Signed)
20 yo primigravida at [redacted]w[redacted]d today based on T1 [place].   She has elevated BP today with 30 protein in the urine as well as measuring small for her dates.  Will send to [facility] for evaluation. Labs: O +, Ab neg. Sickle cell neg. H/H 12.3/35.8, Plt 215. GBS neg. HIV neg. Rubella immune. RPR neg. GC neg. Chlam neg. Varicella immune. Pap smear neg.   Female baby: Zy'kerion  [facility] for evaluation

## 2011-02-03 LAB — URINE CULTURE: Culture  Setup Time: 201203150115

## 2011-02-03 LAB — GC/CHLAMYDIA PROBE AMP, GENITAL
Chlamydia, DNA Probe: NEGATIVE
GC Probe Amp, Genital: NEGATIVE

## 2011-02-07 ENCOUNTER — Inpatient Hospital Stay (HOSPITAL_COMMUNITY)
Admission: AD | Admit: 2011-02-07 | Discharge: 2011-02-18 | DRG: 765 | Disposition: A | Discharge status: Home / Self Care | Payer: Medicaid Other | Source: Ambulatory Visit | Attending: Obstetrics and Gynecology | Admitting: Obstetrics and Gynecology

## 2011-02-07 ENCOUNTER — Other Ambulatory Visit: Payer: Self-pay

## 2011-02-07 DIAGNOSIS — O99330 Smoking (tobacco) complicating pregnancy, unspecified trimester: Secondary | ICD-10-CM

## 2011-02-07 DIAGNOSIS — B9689 Other specified bacterial agents as the cause of diseases classified elsewhere: Secondary | ICD-10-CM | POA: Diagnosis present

## 2011-02-07 DIAGNOSIS — A499 Bacterial infection, unspecified: Secondary | ICD-10-CM | POA: Diagnosis present

## 2011-02-07 DIAGNOSIS — O99340 Other mental disorders complicating pregnancy, unspecified trimester: Secondary | ICD-10-CM

## 2011-02-07 DIAGNOSIS — O365990 Maternal care for other known or suspected poor fetal growth, unspecified trimester, not applicable or unspecified: Secondary | ICD-10-CM

## 2011-02-07 DIAGNOSIS — O169 Unspecified maternal hypertension, unspecified trimester: Secondary | ICD-10-CM

## 2011-02-07 DIAGNOSIS — O2390 Unspecified genitourinary tract infection in pregnancy, unspecified trimester: Secondary | ICD-10-CM | POA: Diagnosis present

## 2011-02-07 DIAGNOSIS — O1414 Severe pre-eclampsia complicating childbirth: Principal | ICD-10-CM | POA: Diagnosis not present

## 2011-02-07 DIAGNOSIS — N760 Acute vaginitis: Secondary | ICD-10-CM | POA: Diagnosis present

## 2011-02-07 DIAGNOSIS — O1490 Unspecified pre-eclampsia, unspecified trimester: Secondary | ICD-10-CM

## 2011-02-07 LAB — ABO/RH: ABO/RH(D): O POS

## 2011-02-07 LAB — CBC
MCH: 29.7 pg (ref 26.0–34.0)
MCV: 86.4 fL (ref 78.0–100.0)
Platelets: 190 10*3/uL (ref 150–400)
RDW: 13.1 % (ref 11.5–15.5)

## 2011-02-07 LAB — COMPREHENSIVE METABOLIC PANEL
Albumin: 3.1 g/dL — ABNORMAL LOW (ref 3.5–5.2)
BUN: 9 mg/dL (ref 6–23)
Creatinine, Ser: 0.68 mg/dL (ref 0.4–1.2)
Glucose, Bld: 77 mg/dL (ref 70–99)
Total Bilirubin: 0.2 mg/dL — ABNORMAL LOW (ref 0.3–1.2)
Total Protein: 6.5 g/dL (ref 6.0–8.3)

## 2011-02-07 LAB — CREATININE CLEARANCE, URINE, 24 HOUR
Creatinine, 24H Ur: 1992 mg/d — ABNORMAL HIGH (ref 700–1800)
Creatinine, Urine: 142.3 mg/dL
Creatinine: 0.68 mg/dL (ref 0.4–1.2)
Urine Total Volume-CRCL: 1400 mL

## 2011-02-07 LAB — PROTEIN, URINE, 24 HOUR: Urine Total Volume-UPROT: 1400 mL

## 2011-02-07 LAB — PROTEIN / CREATININE RATIO, URINE: Total Protein, Urine: 118 mg/dL

## 2011-02-07 LAB — POCT URINALYSIS DIP (DEVICE)
Hgb urine dipstick: NEGATIVE
Protein, ur: 100 mg/dL — AB
Specific Gravity, Urine: >=1.03 (ref 1.005–1.030)
Urobilinogen, UA: 0.2 mg/dL (ref 0.0–1.0)
pH: 6 (ref 5.0–8.0)

## 2011-02-07 LAB — URIC ACID: Uric Acid, Serum: 6.5 mg/dL (ref 2.4–7.0)

## 2011-02-07 LAB — TYPE AND SCREEN
ABO/RH(D): O POS
Antibody Screen: NEGATIVE

## 2011-02-07 LAB — RAPID URINE DRUG SCREEN, HOSP PERFORMED: Opiates: NOT DETECTED

## 2011-02-08 LAB — T3, FREE: T3, Free: 2 pg/mL — ABNORMAL LOW (ref 2.3–4.2)

## 2011-02-08 LAB — T4, FREE: Free T4: 0.96 ng/dL (ref 0.80–1.80)

## 2011-02-09 ENCOUNTER — Other Ambulatory Visit (HOSPITAL_COMMUNITY): Payer: Medicaid Other

## 2011-02-09 ENCOUNTER — Inpatient Hospital Stay (HOSPITAL_COMMUNITY): Payer: Medicaid Other

## 2011-02-09 LAB — COMPREHENSIVE METABOLIC PANEL
AST: 41 U/L — ABNORMAL HIGH (ref 0–37)
AST: 43 U/L — ABNORMAL HIGH (ref 0–37)
Albumin: 3 g/dL — ABNORMAL LOW (ref 3.5–5.2)
Albumin: 3.1 g/dL — ABNORMAL LOW (ref 3.5–5.2)
Calcium: 8.7 mg/dL (ref 8.4–10.5)
Chloride: 105 mEq/L (ref 96–112)
Creatinine, Ser: 0.76 mg/dL (ref 0.4–1.2)
Creatinine, Ser: 0.85 mg/dL (ref 0.4–1.2)
GFR calc Af Amer: >60 mL/min (ref >60)
GFR calc Af Amer: >60 mL/min (ref >60)
GFR calc non Af Amer: >60 mL/min (ref >60)
Potassium: 3.8 mEq/L (ref 3.5–5.1)
Total Bilirubin: 0.1 mg/dL — ABNORMAL LOW (ref 0.3–1.2)

## 2011-02-09 LAB — CBC
MCH: 30.2 pg (ref 26.0–34.0)
MCH: 29.4 pg (ref 26.0–34.0)
MCHC: 35.2 g/dL (ref 30.0–36.0)
Platelets: 182 10*3/uL (ref 150–400)
Platelets: 183 10*3/uL (ref 150–400)
RBC: 4.02 MIL/uL (ref 3.87–5.11)
RDW: 13.2 % (ref 11.5–15.5)
WBC: 16.4 10*3/uL — ABNORMAL HIGH (ref 4.0–10.5)

## 2011-02-09 LAB — STREP B DNA PROBE: Strep Group B Ag: NEGATIVE

## 2011-02-10 ENCOUNTER — Inpatient Hospital Stay (HOSPITAL_COMMUNITY): Payer: Medicaid Other

## 2011-02-10 LAB — COMPREHENSIVE METABOLIC PANEL
AST: 28 U/L (ref 0–37)
BUN: 18 mg/dL (ref 6–23)
CO2: 22 mEq/L (ref 19–32)
Calcium: 8.3 mg/dL — ABNORMAL LOW (ref 8.4–10.5)
Creatinine, Ser: 0.68 mg/dL (ref 0.4–1.2)
GFR calc Af Amer: >60 mL/min (ref >60)
GFR calc non Af Amer: >60 mL/min (ref >60)
Glucose, Bld: 85 mg/dL (ref 70–99)

## 2011-02-10 LAB — CBC
Hemoglobin: 10.9 g/dL — ABNORMAL LOW (ref 12.0–15.0)
MCH: 29.1 pg (ref 26.0–34.0)
MCHC: 33.6 g/dL (ref 30.0–36.0)
MCV: 86.4 fL (ref 78.0–100.0)

## 2011-02-11 ENCOUNTER — Inpatient Hospital Stay (HOSPITAL_COMMUNITY): Payer: Medicaid Other

## 2011-02-11 LAB — COMPREHENSIVE METABOLIC PANEL
ALT: 23 U/L (ref 0–35)
AST: 21 U/L (ref 0–37)
Albumin: 2.6 g/dL — ABNORMAL LOW (ref 3.5–5.2)
Alkaline Phosphatase: 115 U/L (ref 39–117)
Chloride: 106 mEq/L (ref 96–112)
GFR calc Af Amer: >60 mL/min (ref >60)
Potassium: 3.7 mEq/L (ref 3.5–5.1)
Sodium: 132 mEq/L — ABNORMAL LOW (ref 135–145)
Total Bilirubin: 0.4 mg/dL (ref 0.3–1.2)
Total Protein: 5.5 g/dL — ABNORMAL LOW (ref 6.0–8.3)

## 2011-02-11 LAB — CBC
MCH: 29.4 pg (ref 26.0–34.0)
MCHC: 34.2 g/dL (ref 30.0–36.0)
MCV: 86 fL (ref 78.0–100.0)
Platelets: 180 10*3/uL (ref 150–400)

## 2011-02-12 ENCOUNTER — Inpatient Hospital Stay (HOSPITAL_COMMUNITY): Payer: Medicaid Other

## 2011-02-12 LAB — COMPREHENSIVE METABOLIC PANEL
Albumin: 2.5 g/dL — ABNORMAL LOW (ref 3.5–5.2)
Alkaline Phosphatase: 113 U/L (ref 39–117)
BUN: 14 mg/dL (ref 6–23)
Calcium: 8.6 mg/dL (ref 8.4–10.5)
Potassium: 4.2 mEq/L (ref 3.5–5.1)
Sodium: 134 mEq/L — ABNORMAL LOW (ref 135–145)
Total Protein: 5.1 g/dL — ABNORMAL LOW (ref 6.0–8.3)

## 2011-02-12 LAB — CBC
MCHC: 34.4 g/dL (ref 30.0–36.0)
Platelets: 173 10*3/uL (ref 150–400)
RDW: 12.9 % (ref 11.5–15.5)
WBC: 14.5 10*3/uL — ABNORMAL HIGH (ref 4.0–10.5)

## 2011-02-13 LAB — CBC
HCT: 35.5 % — ABNORMAL LOW (ref 36.0–46.0)
MCH: 29.6 pg (ref 26.0–34.0)
MCV: 85.3 fL (ref 78.0–100.0)
Platelets: 190 10*3/uL (ref 150–400)
RBC: 4.16 MIL/uL (ref 3.87–5.11)
WBC: 14.2 10*3/uL — ABNORMAL HIGH (ref 4.0–10.5)

## 2011-02-13 LAB — COMPREHENSIVE METABOLIC PANEL
Albumin: 2.6 g/dL — ABNORMAL LOW (ref 3.5–5.2)
Alkaline Phosphatase: 122 U/L — ABNORMAL HIGH (ref 39–117)
BUN: 15 mg/dL (ref 6–23)
CO2: 22 mEq/L (ref 19–32)
Chloride: 105 mEq/L (ref 96–112)
Creatinine, Ser: 0.75 mg/dL (ref 0.4–1.2)
GFR calc non Af Amer: >60 mL/min (ref >60)
Glucose, Bld: 85 mg/dL (ref 70–99)
Potassium: 4.1 mEq/L (ref 3.5–5.1)
Total Bilirubin: 0.2 mg/dL — ABNORMAL LOW (ref 0.3–1.2)

## 2011-02-13 LAB — TYPE AND SCREEN
ABO/RH(D): O POS
Antibody Screen: NEGATIVE

## 2011-02-14 LAB — CBC
HCT: 39.9 % (ref 36.0–46.0)
Hemoglobin: 14.1 g/dL (ref 12.0–15.0)
MCH: 29.9 pg (ref 26.0–34.0)
MCH: 30.1 pg (ref 26.0–34.0)
MCHC: 35.1 g/dL (ref 30.0–36.0)
MCV: 85.2 fL (ref 78.0–100.0)
Platelets: 187 10*3/uL (ref 150–400)
RBC: 4.25 MIL/uL (ref 3.87–5.11)
RBC: 4.69 MIL/uL (ref 3.87–5.11)
RDW: 13.2 % (ref 11.5–15.5)

## 2011-02-14 LAB — COMPREHENSIVE METABOLIC PANEL
AST: 19 U/L (ref 0–37)
Albumin: 2.7 g/dL — ABNORMAL LOW (ref 3.5–5.2)
BUN: 14 mg/dL (ref 6–23)
Calcium: 7.5 mg/dL — ABNORMAL LOW (ref 8.4–10.5)
Chloride: 102 mEq/L (ref 96–112)
Creatinine, Ser: 0.91 mg/dL (ref 0.4–1.2)
GFR calc Af Amer: >60 mL/min (ref >60)
Total Bilirubin: 0.2 mg/dL — ABNORMAL LOW (ref 0.3–1.2)

## 2011-02-15 ENCOUNTER — Other Ambulatory Visit: Payer: Self-pay | Admitting: Obstetrics & Gynecology

## 2011-02-15 DIAGNOSIS — O2390 Unspecified genitourinary tract infection in pregnancy, unspecified trimester: Secondary | ICD-10-CM

## 2011-02-15 DIAGNOSIS — O365990 Maternal care for other known or suspected poor fetal growth, unspecified trimester, not applicable or unspecified: Secondary | ICD-10-CM

## 2011-02-15 DIAGNOSIS — O1414 Severe pre-eclampsia complicating childbirth: Secondary | ICD-10-CM

## 2011-02-15 LAB — CBC
HCT: 33.6 % — ABNORMAL LOW (ref 36.0–46.0)
MCH: 29.8 pg (ref 26.0–34.0)
MCV: 85.7 fL (ref 78.0–100.0)
RBC: 3.92 MIL/uL (ref 3.87–5.11)
RDW: 13.4 % (ref 11.5–15.5)
WBC: 18 10*3/uL — ABNORMAL HIGH (ref 4.0–10.5)

## 2011-02-15 LAB — MAGNESIUM
Magnesium: 4.8 mg/dL — ABNORMAL HIGH (ref 1.5–2.5)
Magnesium: 5.3 mg/dL — ABNORMAL HIGH (ref 1.5–2.5)

## 2011-02-15 LAB — COMPREHENSIVE METABOLIC PANEL
ALT: 15 U/L (ref 0–35)
Alkaline Phosphatase: 113 U/L (ref 39–117)
BUN: 13 mg/dL (ref 6–23)
CO2: 24 mEq/L (ref 19–32)
GFR calc non Af Amer: >60 mL/min (ref >60)
Glucose, Bld: 105 mg/dL — ABNORMAL HIGH (ref 70–99)
Potassium: 4.1 mEq/L (ref 3.5–5.1)
Sodium: 131 mEq/L — ABNORMAL LOW (ref 135–145)
Total Bilirubin: 0.1 mg/dL — ABNORMAL LOW (ref 0.3–1.2)

## 2011-02-15 LAB — MRSA PCR SCREENING: MRSA by PCR: NEGATIVE

## 2011-02-16 LAB — COMPREHENSIVE METABOLIC PANEL
AST: 22 U/L (ref 0–37)
CO2: 28 mEq/L (ref 19–32)
Calcium: 7.5 mg/dL — ABNORMAL LOW (ref 8.4–10.5)
Creatinine, Ser: 1.09 mg/dL (ref 0.4–1.2)
GFR calc Af Amer: >60 mL/min (ref >60)
GFR calc non Af Amer: >60 mL/min (ref >60)
Total Protein: 4.5 g/dL — ABNORMAL LOW (ref 6.0–8.3)

## 2011-02-16 LAB — CBC
Hemoglobin: 10.6 g/dL — ABNORMAL LOW (ref 12.0–15.0)
MCH: 28.9 pg (ref 26.0–34.0)
MCHC: 33.1 g/dL (ref 30.0–36.0)
Platelets: 148 10*3/uL — ABNORMAL LOW (ref 150–400)
RDW: 13.4 % (ref 11.5–15.5)

## 2011-02-17 LAB — COMPREHENSIVE METABOLIC PANEL
ALT: 14 U/L (ref 0–35)
Alkaline Phosphatase: 79 U/L (ref 39–117)
CO2: 28 mEq/L (ref 19–32)
Calcium: 8.1 mg/dL — ABNORMAL LOW (ref 8.4–10.5)
GFR calc non Af Amer: >60 mL/min (ref >60)
Glucose, Bld: 82 mg/dL (ref 70–99)
Potassium: 3.8 mEq/L (ref 3.5–5.1)
Sodium: 136 mEq/L (ref 135–145)

## 2011-02-17 LAB — CBC
HCT: 29.3 % — ABNORMAL LOW (ref 36.0–46.0)
Hemoglobin: 9.8 g/dL — ABNORMAL LOW (ref 12.0–15.0)
MCHC: 33.4 g/dL (ref 30.0–36.0)
MCV: 88 fL (ref 78.0–100.0)

## 2011-02-18 LAB — CBC
Platelets: 173 10*3/uL (ref 150–400)
RDW: 13.2 % (ref 11.5–15.5)
WBC: 12.9 10*3/uL — ABNORMAL HIGH (ref 4.0–10.5)

## 2011-02-18 LAB — COMPREHENSIVE METABOLIC PANEL
ALT: 25 U/L (ref 0–35)
Albumin: 2.4 g/dL — ABNORMAL LOW (ref 3.5–5.2)
Alkaline Phosphatase: 81 U/L (ref 39–117)
Potassium: 4.1 mEq/L (ref 3.5–5.1)
Sodium: 134 mEq/L — ABNORMAL LOW (ref 135–145)
Total Protein: 5.7 g/dL — ABNORMAL LOW (ref 6.0–8.3)

## 2011-02-21 ENCOUNTER — Encounter: Payer: Medicaid Other | Admitting: Family Medicine

## 2011-02-21 NOTE — Op Note (Signed)
Kaitlyn Hunt, Kaitlyn Hunt            ACCOUNT NO.:  [id number]  MEDICAL RECORD NO.:  [id number]           PATIENT TYPE:  I  LOCATION:  9373                          [facility]ACILITY:  [facility]  PHYSICIAN:  Scheryl Darter, MD       DATE OF BIRTH:  May 12, 1991  DATE OF PROCEDURE:  02/15/2011 DATE OF DISCHARGE:                              OPERATIVE REPORT   PROCEDURE:  Primary low-transverse cesarean section.  PREOPERATIVE DIAGNOSES:  Intrauterine pregnancy at 34 weeks 2 days gestation with preeclampsia, fetal growth restriction, and fetal intolerance of labor.  POSTOPERATIVE DIAGNOSES:  Intrauterine pregnancy at 34 weeks 2 days gestation with preeclampsia, fetal growth restriction, and fetal intolerance of labor.  SURGEON:  Scheryl Darter, MD  ANESTHESIA:  Epidural.  ESTIMATED BLOOD LOSS:  550 mL.  URINE OUTPUT:  50 mL.  FINDINGS:  Live born female infant weight 3 pounds 7 ounces delivered at 0438, Apgars 4 and 7.  SPECIMEN:  Placenta and cord pH 7.13.  COMPLICATIONS:  None.  DRAINS:  Foley catheter.  COUNTS:  Correct.  OPERATIVE COURSE:  The patient gave written consent for cesarean section at 34-1/2 weeks' gestation.  The patient was induced due to severe preeclampsia and fetal growth restriction.  Due to fetal intolerance of labor at 6-7 cm dilation, she was consented for cesarean section.  The patient identification was confirmed.  She was brought to the OR and adequate epidural anesthesia was induced.  She was placed in dorsal supine position, left lateral tilt.  Foley catheter was placed.  Abdomen was sterilely prepped and draped.  A #10 blade was used to make a Pfannenstiel incision down to the fascia.  Hemostasis was obtained with cautery.  Fascia was incised and the incision was extended transversely with curved Mayo scissors.  Fascia was separated from underlying tissue attachments with blunt and sharp dissection.  Bellies of rectus muscles were separated at the midline.   Anterior peritoneum was elevated and incised and the incision was extended vertically under direct vision. Alexis retractor was placed.  The vesicouterine fold was elevated and incised with Metzenbaum scissors and the incision was extended transversely.  Bladder flap was created.  Lower uterine segment was entered at the midline with #10 blade.  Clear fluid was expressed. Incision was extended transversely with blunt traction.  Fetal head was elevated and delivered.  Mouth and nose were cleared with bulb suction. Infant was delivered atraumatically.  Cord was clamped and cut and infant was handed to Dr. Ruben Gottron and NICU personnel in attendance. Infant was delivered at 0438.  Infant was a live born female 1570 grams 3 pounds 7 ounces).  Apgars 4 and 7.  Placenta was delivered and uterine cavity was explored.  The patient received IV Pitocin.  The uterine incision was closed with a running locking suture of 0-Vicryl and imbricating layer followed with 0-Vicryl.  Good hemostasis was seen. Pelvis was irrigated.  The adnexa appeared normal.  The Jon Gills was removed.  Anterior peritoneum was closed with a running suture of 2-0 Vicryl.  Fascia was closed with a running suture of 0-Vicryl. Hemostasis was assured in the incision.  The  incision was irrigated and skin was closed with a running subcuticular suture of 4-0 Vicryl. Sterile dressing was applied.  The patient tolerated the procedure well without complications.  She was brought in stable condition to the recovery room.     Scheryl Darter, MD     JA/MEDQ  D:  02/15/2011  T:  02/16/2011  Job:  [id number]  Electronically Signed by Scheryl Darter MD on 02/21/2011 11:36:25 AM

## 2011-02-22 ENCOUNTER — Telehealth: Payer: Self-pay | Admitting: Family Medicine

## 2011-02-22 LAB — DIFFERENTIAL
Basophils Absolute: 0 10*3/uL (ref 0.0–0.1)
Eosinophils Absolute: 0.2 10*3/uL (ref 0.0–0.7)
Lymphs Abs: 2.6 10*3/uL (ref 0.7–4.0)
Neutrophils Relative %: 58 % (ref 43–77)

## 2011-02-22 LAB — BASIC METABOLIC PANEL
BUN: 12 mg/dL (ref 6–23)
Calcium: 10.3 mg/dL (ref 8.4–10.5)
Creatinine, Ser: 0.78 mg/dL (ref 0.4–1.2)
GFR calc non Af Amer: >60 mL/min (ref >60)
Potassium: 3.7 mEq/L (ref 3.5–5.1)

## 2011-02-22 LAB — CBC
MCV: 94 fL (ref 78.0–100.0)
Platelets: 253 10*3/uL (ref 150–400)
WBC: 7.6 10*3/uL (ref 4.0–10.5)

## 2011-02-22 LAB — ACETAMINOPHEN LEVEL: Acetaminophen (Tylenol), Serum: <10 ug/mL — ABNORMAL LOW (ref 10–30)

## 2011-02-22 LAB — ETHANOL: Alcohol, Ethyl (B): 5 mg/dL (ref 0–10)

## 2011-02-22 LAB — RAPID URINE DRUG SCREEN, HOSP PERFORMED
Barbiturates: NOT DETECTED
Tetrahydrocannabinol: NOT DETECTED

## 2011-02-22 LAB — PREGNANCY, URINE: Preg Test, Ur: NEGATIVE

## 2011-02-22 NOTE — Miscellaneous (Signed)
  Clinical Lists Changes Medical Record Request Received Medical Record Request Records went to [facility] Faxed on 10/21/2009 [facility]  October 22, 2010 4:34 PM

## 2011-02-22 NOTE — Telephone Encounter (Signed)
Need statement from physician that patient is unable to perform community service due to her just delivering by C-Section.  Also need list of all meds that she is taking.  Pt is currently on parole and her PB is needing this by tomorrow when patient goes for her visit.

## 2011-02-28 NOTE — Discharge Summary (Addendum)
Kaitlyn Hunt, Kaitlyn Hunt            ACCOUNT NO.:  [id number]  MEDICAL RECORD NO.:  [id number]           PATIENT TYPE:  I  LOCATION:  9303                          [facility]:  [facility]  PHYSICIAN:  Horton Chin, MD DATE OF BIRTH:  [birth year]  DATE OF ADMISSION:  02/07/2011 DATE OF DISCHARGE:  02/18/2011                              DISCHARGE SUMMARY   ADMISSION DIAGNOSES: 1. Gestational hypertension. 2. Intrauterine pregnancy at 32 weeks and 1 day. 3. Bacterial vaginosis.  DISCHARGE DIAGNOSES: 1. Gestational hypertension with superimposed severe preeclampsia. 2. Status post primary low-transverse cesarean section for     nonreassuring fetal heart tone and active failed induction of labor     for severe preeclampsia. 3. Intrauterine growth restriction.  DISCHARGE MEDICATIONS: 1. Ibuprofen 600 mg one tab p.o. q.6 h. p.r.n. 2. Percocet 5/325 one tab p.o. q.4 h. p.r.n. 3. Iron 325 mg one tab p.o. b.i.d. 4. Colace 100 mg one tab p.o. b.i.d. 5. Hydrochlorothiazide 25 mg one tab p.o. b.i.d.  PROCEDURES:  Primary low-transverse cesarean section on February 15, 2011, intrauterine pregnancy at 34 weeks and 2 days due to frequency and fetal tolerance of labor and nonreassuring fetal heart tones and followed by Dr. Debroah Loop with a delivery of viable female infant.  Weight 3 pounds 7 ounces.  Apgars were 4 and 7, cord pH was 7.13.  There were no immediate complications.  The estimated blood loss of 550 mL.  PERTINENT LABS:  Postoperative hemoglobin was stable at 10.6.  Basic metabolic panel that were obtained daily were within the normal limits with AST, ALT, and creatinine.  The patient did have BPPs prior to delivery that remained 8 out of 8.  Her NST otherwise remained reactive. She did have an ultrasound that had a estimated fetal weight at IUGR. The patient did receive Procardia for blood pressure control.  Estimated fetal weight was less than 10th percentile on February 09, 2011.  MSM  was consulted; and due to the fact that the BPP remained reassuring, then the decision was made for reevaluation for delivery at 34 weeks at which time, she was induced for that.  HOSPITAL COURSE:  This is a 20 year old gravida 1, para 0, who presented on the day of admission with intrauterine pregnancy at 33 weeks and 1 day with gestational hypertension and rule out severe preeclampsia.  Her prenatal history was also significant for bipolar disorder, ADHD, tobacco abuse, small gestational age and a goiter.  She was not labored at the time of admission.  She obtained routine labs and her 24-hour urine was elevated and decision made at the patient was preeclamptic. MSM was consulted.  The patient is to receive betamethasone as an inpatient.  She is otherwise monitored due to the IUGR and elevated blood pressures and preeclampsia.  MSM recommended delivery and induction at 34 weeks.  The patient is otherwise hemodynamically stable and afebrile.  The patient was induced as instructed by MSM at 34 weeks.  She was started on magnesium when she was induced.  She progressed to 6-7 cm dilation and she continues to have recurrent late variable decels with contraction every 3-8 minutes. Also  had a decrease in the variability.  Pitocin was initially used for augmentation which was turned off.  Intrauterine resuscitation was performed; however, within the next hour, the patient continued to have nonreassuring fetal heart tones and the decision was made to proceed with primary low-transverse cesarean section as previously stated.  Her postoperative course was benign.  She continued to have elevated blood pressure and she was started on hydrochlorothiazide.  In that regard, her blood pressures were labile ranging from 116-150.  She remained asymptomatic.  She is otherwise doing well.  She was discharged home on postoperative day 3 in stable condition.  DISPOSITION:  Discharged home.  DISCHARGE  CONDITION:  Stable.  FOLLOWUP:  The patient is to follow up in the clinic in 2 weeks for blood pressure check and incision check and ER warning.  The patient should return to the emergency department if any fever, chills, nausea, vomiting, any worsening headaches, vision changes, persistent right upper quadrant pain, any problems with her incision, redness, swelling, discharge, or any other complications.    ______________________________ Maryelizabeth Kaufmann, MD   ______________________________ Horton Chin, MD    LC/MEDQ  D:  02/18/2011  T:  02/19/2011  Job:  [id number]  Electronically Signed by Jaynie Collins MD on 02/28/2011 11:57:20 AM Electronically Signed by Maryelizabeth Kaufmann MD on 02/28/2011 12:27:12 PM

## 2011-03-04 ENCOUNTER — Ambulatory Visit (INDEPENDENT_AMBULATORY_CARE_PROVIDER_SITE_OTHER): Payer: Self-pay | Admitting: Obstetrics & Gynecology

## 2011-03-07 NOTE — Progress Notes (Signed)
Kaitlyn Hunt, Kaitlyn Hunt            ACCOUNT NO.:  [id number]  MEDICAL RECORD NO.:  [id number]           PATIENT TYPE:  [facility]ATION:  [facility]                     FACILITY:  PHYSICIAN:  Scheryl Darter, MD       DATE OF BIRTH:  01-25-1991  DATE OF SERVICE:                                 CLINIC NOTE  The patient comes today postoperative from a cesarean section performed at 34 weeks for nonreassuring fetal heart monitoring.  Procedure was performed on February 15, 2011.  Infant was female, 2 pounds and 7 ounces. The patient states the baby is doing well and came home 2 days ago.  She still has some slight lower abdominal soreness.  She has few more ibuprofen tablets which she says that do help with pain.  No problems with urination or with heavy bleeding.  Her Edinburgh depression scale score is low today.  Her abdominal incision is healing well with minimal incisional tenderness.  Pelvic exam was deferred today.  Blood pressure was 118/82.  The patient is doing well postpartum.  Blood pressure appears to be well and controlled currently on hydrochlorothiazide.  PLAN:  She is to finish her hydrochlorothiazide.  Gave prescription for ibuprofen 800 mg p.o. q.8 h. p.r.n. pain.  She can return in 3-4 weeks for 6-week postpartum check.     Scheryl Darter, MD    JA/MEDQ  D:  03/04/2011  T:  03/04/2011  Job:  [id number]

## 2011-03-14 NOTE — Telephone Encounter (Signed)
Was this letter ever written?

## 2011-03-28 ENCOUNTER — Encounter: Payer: Self-pay | Admitting: Family Medicine

## 2011-03-28 ENCOUNTER — Other Ambulatory Visit: Payer: Self-pay | Admitting: Family Medicine

## 2011-03-28 NOTE — Telephone Encounter (Signed)
Letter written and signed.

## 2011-03-30 ENCOUNTER — Ambulatory Visit: Payer: Self-pay | Admitting: Family Medicine

## 2011-04-01 ENCOUNTER — Ambulatory Visit: Payer: Self-pay | Admitting: Physician Assistant

## 2011-04-12 ENCOUNTER — Ambulatory Visit: Payer: Self-pay | Admitting: Family Medicine

## 2011-04-22 ENCOUNTER — Ambulatory Visit: Payer: Self-pay | Admitting: Family Medicine

## 2011-05-09 ENCOUNTER — Inpatient Hospital Stay (INDEPENDENT_AMBULATORY_CARE_PROVIDER_SITE_OTHER)
Admission: RE | Admit: 2011-05-09 | Discharge: 2011-05-09 | Disposition: A | Discharge status: Home / Self Care | Payer: Medicaid Other | Source: Ambulatory Visit

## 2011-05-09 ENCOUNTER — Ambulatory Visit (INDEPENDENT_AMBULATORY_CARE_PROVIDER_SITE_OTHER): Payer: Medicaid Other | Admitting: *Deleted

## 2011-05-09 DIAGNOSIS — T6391XA Toxic effect of contact with unspecified venomous animal, accidental (unintentional), initial encounter: Secondary | ICD-10-CM

## 2011-05-09 DIAGNOSIS — Z309 Encounter for contraceptive management, unspecified: Secondary | ICD-10-CM

## 2011-05-09 MED ORDER — MEDROXYPROGESTERONE ACETATE 150 MG/ML IM SUSP
150.0000 mg | Freq: Once | INTRAMUSCULAR | Status: AC
  Administered 2011-05-09: 150 mg via INTRAMUSCULAR

## 2011-05-09 NOTE — Progress Notes (Signed)
It is documented in records from [facility] that patient received Depo Provera after birth of baby  on 02/18/2011. Given today.

## 2011-05-10 ENCOUNTER — Other Ambulatory Visit: Payer: Self-pay | Admitting: Family Medicine

## 2011-05-10 MED ORDER — MEDROXYPROGESTERONE ACETATE 150 MG/ML IM SUSP: 150.0000 mg | INTRAMUSCULAR | Status: DC

## 2011-05-10 NOTE — Telephone Encounter (Signed)
Standing order for Depo q 3 mos for contraception.

## 2011-06-20 ENCOUNTER — Ambulatory Visit (INDEPENDENT_AMBULATORY_CARE_PROVIDER_SITE_OTHER): Payer: Medicaid Other | Admitting: Family Medicine

## 2011-06-20 ENCOUNTER — Encounter: Payer: Self-pay | Admitting: Family Medicine

## 2011-06-20 DIAGNOSIS — F39 Unspecified mood [affective] disorder: Secondary | ICD-10-CM

## 2011-06-20 DIAGNOSIS — E049 Nontoxic goiter, unspecified: Secondary | ICD-10-CM

## 2011-06-20 LAB — COMPREHENSIVE METABOLIC PANEL
ALT: 11 U/L (ref 0–35)
AST: 17 U/L (ref 0–37)
Albumin: 4.8 g/dL (ref 3.5–5.2)
Alkaline Phosphatase: 58 U/L (ref 39–117)
Potassium: 4.2 mEq/L (ref 3.5–5.3)
Sodium: 138 mEq/L (ref 135–145)
Total Bilirubin: 0.4 mg/dL (ref 0.3–1.2)
Total Protein: 7.9 g/dL (ref 6.0–8.3)

## 2011-06-20 LAB — CBC
HCT: 40.8 % (ref 36.0–46.0)
Platelets: 316 10*3/uL (ref 150–400)
RDW: 13.7 % (ref 11.5–15.5)
WBC: 7.5 10*3/uL (ref 4.0–10.5)

## 2011-06-20 LAB — TSH: TSH: 1.795 u[IU]/mL (ref 0.350–4.500)

## 2011-06-20 MED ORDER — QUETIAPINE FUMARATE 300 MG PO TABS: 300.0000 mg | ORAL_TABLET | Freq: Every day | ORAL | Status: DC

## 2011-06-20 MED ORDER — QUETIAPINE FUMARATE 50 MG PO TABS: 50.0000 mg | ORAL_TABLET | Freq: Every day | ORAL | Status: DC

## 2011-06-20 NOTE — Patient Instructions (Signed)
Very important to stay on birth control Call 911 if you have worsening mood and are afraid of hurting yourself or others Make follow-up with me in 2-3 weeks Call [phone] for [facility] and tell them you want to start seeing them for your bipolar disorder

## 2011-06-20 NOTE — Progress Notes (Signed)
  Subjective:    Patient ID: Kaitlyn Hunt, female    DOB: 1991-02-26, 20 y.o.   MRN: [id number]  HPI Dx with bipolar disorder in teens at [facility], she did not want to start medication at that time.  She started on Depakote 2 years ago and stopped when she was incarcerated (30 days) about one year later.  She states she did not want to take medications while in jail but did not elaborate why.  Was in for  ?Drinking and drug charge.   Afterwards was pregnant so did not restart medications.  While on depakote feels her moods were better controlled but is reluctant to take this due to seeing commercials on tv about litigation.  States would like to get back on medications as multiple family members and probation officers have noticed mood lability, irritability.  Felicica reports being very irritable, homicidal ideation, no plan or specific targets.  Visualizes physically hurting people.  No SI.  States she stay to herself when she feels like this. Does not feel angry toward her infant.  Patient self reported testing limited by poor health literacy.  When reviewing questions, she did not seem to understand many of the questions. PHQ-9: 18 MDQ: 3 GAD-7: 10 BSDS: only filled out bottom porion stating "the story fits me to some degree" but is unable to tell me further what aprts. Review of Systemsno SI.   see HPI Objective:   Physical Exam Generally:  NAD, tearful after filling out survey, states her symptoms cause distress Psych: Alert and oriented.  In room with Child psychotherapist.  She answers my questions vaguely- such as how often does baby eat "a lot" and does not clarify.  Poor health literacy.  ? Poor literacy.  Fair insight.         Assessment & Plan:   No problem-specific assessment & plan notes found for this encounter.

## 2011-06-20 NOTE — Assessment & Plan Note (Signed)
Long history of mood disorder, diagnosed previously as possible bipolar disorder.  Today characterized as irritability as primary symptoms, and insomnia.  No delusions, psychosis.     Will start seroquel and refer to mental health for long term management.  Will draw baseline labs and follow-up in 2-3 weeks.

## 2011-06-21 ENCOUNTER — Encounter: Payer: Self-pay | Admitting: Family Medicine

## 2011-06-28 ENCOUNTER — Ambulatory Visit: Payer: Medicaid Other | Admitting: Family Medicine

## 2011-06-29 ENCOUNTER — Ambulatory Visit: Payer: Medicaid Other | Admitting: Family Medicine

## 2011-06-30 ENCOUNTER — Ambulatory Visit: Payer: Medicaid Other | Admitting: Family Medicine

## 2011-07-17 ENCOUNTER — Telehealth: Payer: Self-pay | Admitting: Family Medicine

## 2011-07-17 NOTE — Telephone Encounter (Signed)
Received call from patients mother that she had teeth extracted last Monday.  Had been doing well until she started taking amoxicillin a couple days ago.  Since that time she has had her lips swell and crack with blisters and her throat has felt very sore and scratchy.  No rash elsewhere that she has noticed.  Advised to stop antibiotic for now and call in the morning to be evaluated at [facility] if symptoms persist.  Suggested she could take benadryl to see if this helps improve.

## 2011-08-02 ENCOUNTER — Ambulatory Visit (INDEPENDENT_AMBULATORY_CARE_PROVIDER_SITE_OTHER): Payer: Medicaid Other | Admitting: *Deleted

## 2011-08-02 DIAGNOSIS — Z309 Encounter for contraceptive management, unspecified: Secondary | ICD-10-CM

## 2011-08-02 MED ORDER — MEDROXYPROGESTERONE ACETATE 150 MG/ML IM SUSP
150.0000 mg | Freq: Once | INTRAMUSCULAR | Status: AC
  Administered 2011-08-02: 150 mg via INTRAMUSCULAR

## 2011-08-02 MED ORDER — MEDROXYPROGESTERONE ACETATE 150 MG/ML IM SUSP: 150.0000 mg | INTRAMUSCULAR | Status: DC

## 2011-08-24 LAB — URINE MICROSCOPIC-ADD ON

## 2011-08-24 LAB — URINALYSIS, ROUTINE W REFLEX MICROSCOPIC
Glucose, UA: NEGATIVE
Hgb urine dipstick: NEGATIVE
Protein, ur: 100 — AB
Urobilinogen, UA: 1

## 2011-08-24 LAB — COMPREHENSIVE METABOLIC PANEL
Alkaline Phosphatase: 87
BUN: 10
Creatinine, Ser: 0.94
Glucose, Bld: 84
Potassium: 4
Total Protein: 8.2

## 2011-08-24 LAB — WET PREP, GENITAL
Trich, Wet Prep: NONE SEEN
Yeast Wet Prep HPF POC: NONE SEEN

## 2011-08-24 LAB — GC/CHLAMYDIA PROBE AMP, GENITAL: GC Probe Amp, Genital: NEGATIVE

## 2011-08-26 LAB — POCT CARDIAC MARKERS: Troponin i, poc: <0.05

## 2011-08-26 LAB — DIFFERENTIAL
Basophils Relative: 1
Lymphocytes Relative: 46
Lymphs Abs: 3.7
Monocytes Relative: 7
Neutro Abs: 3.4
Neutrophils Relative %: 42 — ABNORMAL LOW

## 2011-08-26 LAB — URINE MICROSCOPIC-ADD ON

## 2011-08-26 LAB — URINALYSIS, ROUTINE W REFLEX MICROSCOPIC
Bilirubin Urine: NEGATIVE
Hgb urine dipstick: NEGATIVE
Specific Gravity, Urine: 1.034 — ABNORMAL HIGH
pH: 6

## 2011-08-26 LAB — INFLUENZA A+B VIRUS AG-DIRECT(RAPID)

## 2011-08-26 LAB — URINE CULTURE

## 2011-08-26 LAB — I-STAT 8, (EC8 V) (CONVERTED LAB)
BUN: 8
Bicarbonate: 25 — ABNORMAL HIGH
Chloride: 108
HCT: 40
Hemoglobin: 13.6
Operator id: 151321
Sodium: 141

## 2011-08-26 LAB — POCT I-STAT CREATININE: Creatinine, Ser: 1

## 2011-08-26 LAB — CBC
RBC: 4.25
WBC: 8.2

## 2011-09-06 ENCOUNTER — Encounter (HOSPITAL_COMMUNITY): Payer: Self-pay | Admitting: *Deleted

## 2011-09-07 LAB — POCT CARDIAC MARKERS
CKMB, poc: <1 — ABNORMAL LOW
Troponin i, poc: <0.05

## 2011-09-27 ENCOUNTER — Ambulatory Visit (INDEPENDENT_AMBULATORY_CARE_PROVIDER_SITE_OTHER): Payer: Medicaid Other | Admitting: Family Medicine

## 2011-09-27 ENCOUNTER — Encounter: Payer: Self-pay | Admitting: Family Medicine

## 2011-09-27 VITALS — BP 127/72 | HR 73 | Ht 63.0 in | Wt 104.0 lb

## 2011-09-27 DIAGNOSIS — F39 Unspecified mood [affective] disorder: Secondary | ICD-10-CM

## 2011-09-27 DIAGNOSIS — Z23 Encounter for immunization: Secondary | ICD-10-CM

## 2011-09-27 MED ORDER — QUETIAPINE FUMARATE 200 MG PO TABS: 200.0000 mg | ORAL_TABLET | Freq: Every day | ORAL | Status: DC

## 2011-09-27 NOTE — Progress Notes (Signed)
  Subjective:    Patient ID: Kaitlyn Hunt, female    DOB: [birth year], 20 y.o.   MRN: [id number]  HPI patient is here for followup of mood disorder  She was last seen on July 30 and we discussed restarting a mood stabilizer for her previous diagnosis of bipolar disorder. Patient had previously been on Depakote but was reluctant to restart that do 2 things she had seen on TV. She was discontinued during her pregnancy. We decided to restart Seroquel and titrate up to 300 mg.  She reports a great deal of improvement and irritability. She no longer has any homicidal ideation. She notes much decreased frustration with her infant and is able to handle adversity, weight and when she feels frustrated she just walks away from people or can put her in for down in a safe place. She notes resolution of sadness and crying.  She was asked to make an appointment with mental health for further followup. She states she has not done this to 2 needing to spend time on her probation activities. Overall she is happy with Seroquel but feels that the 300 mg makes her too sleepy. We discussed taking it later as she is currently taking it at 6 PM. She would rather decrease the dose   Review of Systems Please see history of present illness    Objective:   Physical Exam  Psychiatric: Her speech is normal and behavior is normal. Thought content normal. Her mood appears not anxious. Her affect is not angry. Cognition and memory are normal. She does not exhibit a depressed mood. She expresses no homicidal and no suicidal ideation.   she is caring and much happier and interacting in a healthy manner with her infant  GEN: Alert & Oriented, No acute distress        Assessment & Plan:

## 2011-09-27 NOTE — Patient Instructions (Signed)
Im glad you are feeling better  Your job is to have an apopintment set up at mental health before you run out of medicine in 2 months  Call [phone] for [facility] and tell them you want to start seeing them for your bipolar disorder

## 2011-09-27 NOTE — Assessment & Plan Note (Signed)
We discussed maintaining her at 300 mg for maximum effectiveness and just taking it later on in the evening but she is reluctant to do so. We will decrease her Seroquel to 200 mg. I have reiterated that it is important for her to make an appointment with mental health. I have given her a two-month supply of Seroquel and stated that I expect her to call for an appointment prior to running out of medication.

## 2011-10-31 ENCOUNTER — Ambulatory Visit: Payer: Medicaid Other

## 2011-10-31 ENCOUNTER — Ambulatory Visit (INDEPENDENT_AMBULATORY_CARE_PROVIDER_SITE_OTHER): Payer: Medicaid Other | Admitting: *Deleted

## 2011-10-31 DIAGNOSIS — Z309 Encounter for contraceptive management, unspecified: Secondary | ICD-10-CM

## 2011-10-31 MED ORDER — MEDROXYPROGESTERONE ACETATE 150 MG/ML IM SUSP
150.0000 mg | Freq: Once | INTRAMUSCULAR | Status: AC
  Administered 2011-10-31: 150 mg via INTRAMUSCULAR

## 2011-11-04 ENCOUNTER — Encounter: Payer: Self-pay | Admitting: Family Medicine

## 2011-11-04 ENCOUNTER — Ambulatory Visit (INDEPENDENT_AMBULATORY_CARE_PROVIDER_SITE_OTHER): Payer: Self-pay | Admitting: Family Medicine

## 2011-11-04 VITALS — BP 135/86 | HR 75 | Temp 97.9°F | Ht 63.0 in | Wt 103.9 lb

## 2011-11-04 DIAGNOSIS — S161XXA Strain of muscle, fascia and tendon at neck level, initial encounter: Secondary | ICD-10-CM | POA: Insufficient documentation

## 2011-11-04 DIAGNOSIS — IMO0002 Reserved for concepts with insufficient information to code with codable children: Secondary | ICD-10-CM

## 2011-11-04 DIAGNOSIS — S39012A Strain of muscle, fascia and tendon of lower back, initial encounter: Secondary | ICD-10-CM

## 2011-11-04 MED ORDER — CYCLOBENZAPRINE HCL 10 MG PO TABS: 10.0000 mg | ORAL_TABLET | Freq: Three times a day (TID) | ORAL | Status: DC | PRN

## 2011-11-04 MED ORDER — MELOXICAM 15 MG PO TABS: 15.0000 mg | ORAL_TABLET | Freq: Every day | ORAL | Status: DC
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
## 2011-12-05 ENCOUNTER — Ambulatory Visit (INDEPENDENT_AMBULATORY_CARE_PROVIDER_SITE_OTHER): Payer: Medicaid Other | Admitting: Family Medicine

## 2011-12-05 ENCOUNTER — Encounter: Payer: Self-pay | Admitting: Family Medicine

## 2011-12-05 DIAGNOSIS — R51 Headache: Secondary | ICD-10-CM

## 2011-12-05 NOTE — Patient Instructions (Signed)
Take motrin 800mg  every 8 hours as needed for headache.  If no improvement in your headache or if any new or worsening of symptoms I want you to come back for recheck.
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
## 2011-12-10 NOTE — Assessment & Plan Note (Signed)
Physical exam reassuring.  Neuro exam wnl.  Pt to take motrin as needed for pain.  Most likely symptoms/pain at areas of trauma will improve. No further intervention needed at this time.  If new or worsening of symptoms pt is to return.  Should be feeling better in the next few days if not pt is to return for recheck.

## 2011-12-13 ENCOUNTER — Telehealth: Payer: Self-pay | Admitting: Psychology

## 2011-12-13 NOTE — Telephone Encounter (Signed)
Patient left VM.  Returned call and spoke to patient briefly before the phone was given to her mother.  Mother was difficult to follow.  I think she said that because of all of Kaitlyn Hunt's demands (probation, childcare, etc...) [facility] is not an option and she needs to be seen at [facility].  She mentioned something about a court order to get her seen here.  I attempted to state that, based on my read of the medical record, Kaitlyn Hunt is not a good match for our [facility] in that she likely needs a higher level of specialist care.  Kaitlyn Hunt's mom stated that she has her mental health issues treated as [facility] as does another daughter and she is not sure why Dr. Earnest Bailey can't do the same.  She mentioned wanted to change physicians in order to make this happen.  Kaitlyn Hunt (Kaitlyn Hunt's mom) had difficulty understanding different practice styles and the limits of the [facility].  I suggested she address her concerns further with Dr. Earnest Bailey and if not her, our clinic manager.    At the end of our conversation, Kaitlyn Hunt (Kaitlyn Hunt's mom) asked if I would be able to see her because that referral was made from Dr. Durene Cal as well.  See documentation under her medical record Kaitlyn Hunt).    Will forward to PCP and clinic manager for review.

## 2011-12-28 ENCOUNTER — Encounter: Payer: Medicaid Other | Admitting: Family Medicine

## 2012-01-17 ENCOUNTER — Ambulatory Visit: Payer: Medicaid Other

## 2012-01-18 ENCOUNTER — Encounter: Payer: Medicaid Other | Admitting: Family Medicine

## 2012-02-16 ENCOUNTER — Ambulatory Visit (INDEPENDENT_AMBULATORY_CARE_PROVIDER_SITE_OTHER): Payer: Medicaid Other | Admitting: *Deleted

## 2012-02-16 DIAGNOSIS — Z309 Encounter for contraceptive management, unspecified: Secondary | ICD-10-CM

## 2012-02-16 MED ORDER — MEDROXYPROGESTERONE ACETATE 150 MG/ML IM SUSP
150.0000 mg | Freq: Once | INTRAMUSCULAR | Status: AC
  Administered 2012-02-16: 150 mg via INTRAMUSCULAR

## 2012-02-16 NOTE — Progress Notes (Signed)
Patient is late with Depo .  Last sex was more than 2 weeks ago .  Urine preg test negative.  Will need to return in 2 weeks to repeat upreg.  Advised extra protection for next 7 days. Appointment scheduled for CPE

## 2012-02-27 ENCOUNTER — Encounter: Payer: Medicaid Other | Admitting: Family Medicine

## 2012-02-29 ENCOUNTER — Telehealth: Payer: Self-pay | Admitting: Family Medicine

## 2012-02-29 NOTE — Telephone Encounter (Signed)
Would like to speak with Dr Earnest Bailey - he is faxing a consent form over and will put on there what it is that he needs to speak to you about.

## 2012-03-01 NOTE — Telephone Encounter (Signed)
No form received yet.  Will continue to monitor for fax.

## 2012-03-02 ENCOUNTER — Encounter: Payer: Self-pay | Admitting: Family Medicine

## 2012-05-31 ENCOUNTER — Ambulatory Visit (INDEPENDENT_AMBULATORY_CARE_PROVIDER_SITE_OTHER): Payer: Medicaid Other | Admitting: *Deleted

## 2012-05-31 DIAGNOSIS — Z309 Encounter for contraceptive management, unspecified: Secondary | ICD-10-CM

## 2012-05-31 MED ORDER — MEDROXYPROGESTERONE ACETATE 150 MG/ML IM SUSP
150.0000 mg | Freq: Once | INTRAMUSCULAR | Status: AC
  Administered 2012-05-31: 150 mg via INTRAMUSCULAR

## 2012-05-31 NOTE — Progress Notes (Signed)
Patient late for Depo today. Urine specimen obtained and urine preg test done and is negative. Patient states last sex was more than 2 weeks ago. Depo given today . Advised to use extra protection before next depo due. At last nurse visit an appointment was scheduled for CPX, however patient no showed.  Again advised to schedule appointment and come in before next depo is due.  Consulted Dr. Earnest Bailey about this.

## 2012-07-22 ENCOUNTER — Emergency Department (HOSPITAL_COMMUNITY): Payer: Medicaid Other

## 2012-07-22 ENCOUNTER — Encounter (HOSPITAL_COMMUNITY): Payer: Self-pay | Admitting: *Deleted

## 2012-07-22 ENCOUNTER — Emergency Department (HOSPITAL_COMMUNITY)
Admission: EM | Admit: 2012-07-22 | Discharge: 2012-07-22 | Disposition: A | Discharge status: Home / Self Care | Payer: Medicaid Other | Attending: Emergency Medicine | Admitting: Emergency Medicine

## 2012-07-22 DIAGNOSIS — W268XXA Contact with other sharp object(s), not elsewhere classified, initial encounter: Secondary | ICD-10-CM | POA: Insufficient documentation

## 2012-07-22 DIAGNOSIS — S91309A Unspecified open wound, unspecified foot, initial encounter: Secondary | ICD-10-CM | POA: Insufficient documentation

## 2012-07-22 DIAGNOSIS — Z1881 Retained glass fragments: Secondary | ICD-10-CM | POA: Insufficient documentation

## 2012-07-22 DIAGNOSIS — M79609 Pain in unspecified limb: Secondary | ICD-10-CM | POA: Insufficient documentation

## 2012-07-22 DIAGNOSIS — IMO0002 Reserved for concepts with insufficient information to code with codable children: Secondary | ICD-10-CM

## 2012-07-22 MED ORDER — OXYCODONE-ACETAMINOPHEN 5-325 MG PO TABS
1.0000 | ORAL_TABLET | Freq: Once | ORAL | Status: AC
  Administered 2012-07-22: 1 via ORAL
  Filled 2012-07-22: qty 1

## 2012-07-22 MED ORDER — IBUPROFEN 800 MG PO TABS
800.0000 mg | ORAL_TABLET | Freq: Once | ORAL | Status: AC
  Administered 2012-07-22: 800 mg via ORAL
  Filled 2012-07-22: qty 1

## 2012-07-22 NOTE — ED Notes (Signed)
Dressing with gauze and bacitracin provided to R foot by ortho tech.

## 2012-07-22 NOTE — ED Notes (Signed)
X-ray at bedside

## 2012-07-22 NOTE — ED Notes (Signed)
Per EMS. Pt stepped on glass last night with right foot and has laceration to 4th and 5th digit on right foot.  Bleeding controlled.  Pt reports tetanus shot in 2008. Vitals stable en route. Pt reports taking tylenol for pain this AM which helped pain down to a 4.

## 2012-07-22 NOTE — ED Notes (Signed)
Pt soaking R foot in warm water and hibiclens.

## 2012-07-22 NOTE — ED Notes (Signed)
Pt ambulatory at triage to room.

## 2012-07-23 NOTE — ED Provider Notes (Signed)
History     CSN: [id number]  Arrival date & time 07/22/12  1239   First MD Initiated Contact with Patient 07/22/12 1641      Chief Complaint  Patient presents with  . Foot Pain  . Laceration    (Consider location/radiation/quality/duration/timing/severity/associated sxs/prior treatment) Patient is a 21 y.o. female presenting with lower extremity pain and skin laceration. The history is provided by the patient. No language interpreter was used.  Foot Pain This is a new problem. The current episode started yesterday. Pertinent negatives include no fever, nausea or vomiting. The symptoms are aggravated by walking. She has tried nothing for the symptoms.  Laceration  The incident occurred 12 to 24 hours ago. The laceration is located on the right foot. The laceration mechanism was a broken glass. The pain is at a severity of 6/10. The pain is moderate. The pain has been constant since onset. Possible foreign bodies include glass. Her tetanus status is UTD.  Stepped on glass last night around midnight. +etoh has not cleaned wound. Dried blood noted.    Past Medical History  Diagnosis Date  . Bipolar 1 disorder     previously on Depakote D/C 5/11 bc pt stated it made her feel lazy   . Eclampsia in pregnancy     Past Surgical History  Procedure Date  . Cesarean section     Family History  Problem Relation Age of Onset  . Bipolar disorder Mother   . Bipolar disorder Maternal Aunt   . Bipolar disorder Sister     History  Substance Use Topics  . Smoking status: Current Everyday Smoker -- 0.5 packs/day    Types: Cigarettes  . Smokeless tobacco: Not on file  . Alcohol Use: No    OB History    Grav Para Term Preterm Abortions TAB SAB Ect Mult Living   1 1  1             Review of Systems  Constitutional: Negative.  Negative for fever.  HENT: Negative.   Eyes: Negative.   Respiratory: Negative.   Cardiovascular: Negative.   Gastrointestinal: Negative.  Negative for  nausea and vomiting.  Musculoskeletal:       R foot pain  Neurological: Negative.   Psychiatric/Behavioral: Negative.   All other systems reviewed and are negative.    Allergies  Review of patient's allergies indicates no known allergies.  Home Medications   Current Outpatient Rx  Name Route Sig Dispense Refill  . ARIPIPRAZOLE 10 MG PO TABS Oral Take 10 mg by mouth daily.    Marland Kitchen MEDROXYPROGESTERONE ACETATE 150 MG/ML IM SUSP Intramuscular Inject 150 mg into the muscle every 3 (three) months.      BP 115/76  Pulse 68  Temp 99.2 F (37.3 C) (Oral)  Resp 16  Wt 109 lb (49.442 kg)  SpO2 100%  Physical Exam  Nursing note and vitals reviewed. Constitutional: She is oriented to person, place, and time. She appears well-developed and well-nourished.  HENT:  Head: Normocephalic and atraumatic.  Eyes: Conjunctivae and EOM are normal. Pupils are equal, round, and reactive to light.  Neck: Normal range of motion. Neck supple.  Cardiovascular: Normal rate.   Pulmonary/Chest: Effort normal.  Abdominal: Soft.  Musculoskeletal: Normal range of motion. She exhibits tenderness. She exhibits no edema.       Rt foot flap lac > 12 hours out  No sutures requred.  Soaked in ns/betadine and flushed with ns.  Glass removed from wound  Neurological: She is  alert and oriented to person, place, and time. She has normal reflexes.  Skin: Skin is warm and dry.  Psychiatric: She has a normal mood and affect.    ED Course  Procedures (including critical care time)  Labs Reviewed - No data to display Dg Foot Complete Right  07/22/2012  *RADIOLOGY REPORT*  Clinical Data: Multiple right foot lacerations from stepping on broken glass.  RIGHT FOOT COMPLETE - 3+ VIEW  Comparison: None.  Findings: Small curvilinear density projected over the plantar aspect of the distal foot at the level of the proximal phalanges. Soft tissue swelling in that area.  No fracture or dislocation.  IMPRESSION: Small linear foreign  body or overlapping soft tissues in the plantar aspect of the distal foot, as described above.  This could represent a sliver of glass.   Original Report Authenticated By: Darrol Angel, M.D.      1. Laceration       MDM  20yo female with lac dorsal 3/4 R toe 2 cm flap.  > 12 out.  No sutures.  Wound care with extensive flushing to remove glass foreign body.  Post op shoe.  No co mobidities. No antibiotics.  Follow up with pcp of choice. Clean daily with antibacterial soap and cover with shoes and sox.  Return to ER for severe pain or other concerns.  Left open with dressing and ointment.          Remi Haggard, NP 07/23/12 1408

## 2012-07-25 NOTE — ED Provider Notes (Signed)
Medical screening examination/treatment/procedure(s) were performed by non-physician practitioner and as supervising physician I was immediately available for consultation/collaboration.  Kyrstyn Greear T Batya Citron, MD 07/25/12 2055 

## 2012-10-02 ENCOUNTER — Ambulatory Visit: Payer: Medicaid Other

## 2012-11-01 ENCOUNTER — Ambulatory Visit (INDEPENDENT_AMBULATORY_CARE_PROVIDER_SITE_OTHER): Payer: Medicaid Other | Admitting: *Deleted

## 2012-11-01 DIAGNOSIS — Z309 Encounter for contraceptive management, unspecified: Secondary | ICD-10-CM

## 2012-11-01 LAB — POCT URINE PREGNANCY: Preg Test, Ur: NEGATIVE

## 2012-11-01 MED ORDER — MEDROXYPROGESTERONE ACETATE 150 MG/ML IM SUSP
150.0000 mg | Freq: Once | INTRAMUSCULAR | Status: AC
  Administered 2012-11-01: 150 mg via INTRAMUSCULAR

## 2012-11-01 NOTE — Progress Notes (Signed)
Patient late for Depo. She was advised at last Depo that was given in July of need to schedule appointment for CPE.  Patient was incarcerated in Sept  And was released yesterday.  Consulted with Dr. Earnest Bailey and she advised OK to receive depo today.    Patient given reminder card for next depo and I wrote down on this card to call in next 2 weeks to schedule appointment because schedule is not out at this time for January. Advised will need to be seen prior to next depo injection.    Urine pregnancy test performed and is negative.  Last sexual intercourse longer than 2 weeks ago.   Next depo due Feb 27 through January 31, 2013.

## 2013-02-25 ENCOUNTER — Ambulatory Visit (INDEPENDENT_AMBULATORY_CARE_PROVIDER_SITE_OTHER): Payer: Medicaid Other | Admitting: Family Medicine

## 2013-02-25 ENCOUNTER — Other Ambulatory Visit (HOSPITAL_COMMUNITY)
Admission: RE | Admit: 2013-02-25 | Discharge: 2013-02-25 | Disposition: A | Discharge status: Home / Self Care | Payer: Medicaid Other | Source: Ambulatory Visit | Attending: Family Medicine | Admitting: Family Medicine

## 2013-02-25 VITALS — BP 120/80 | HR 72 | Ht 63.0 in | Wt 117.3 lb

## 2013-02-25 DIAGNOSIS — A599 Trichomoniasis, unspecified: Secondary | ICD-10-CM

## 2013-02-25 DIAGNOSIS — IMO0001 Reserved for inherently not codable concepts without codable children: Secondary | ICD-10-CM

## 2013-02-25 DIAGNOSIS — Z113 Encounter for screening for infections with a predominantly sexual mode of transmission: Secondary | ICD-10-CM | POA: Insufficient documentation

## 2013-02-25 DIAGNOSIS — Z7251 High risk heterosexual behavior: Secondary | ICD-10-CM

## 2013-02-25 DIAGNOSIS — Z309 Encounter for contraceptive management, unspecified: Secondary | ICD-10-CM | POA: Insufficient documentation

## 2013-02-25 DIAGNOSIS — Z20828 Contact with and (suspected) exposure to other viral communicable diseases: Secondary | ICD-10-CM

## 2013-02-25 DIAGNOSIS — N898 Other specified noninflammatory disorders of vagina: Secondary | ICD-10-CM

## 2013-02-25 DIAGNOSIS — Z202 Contact with and (suspected) exposure to infections with a predominantly sexual mode of transmission: Secondary | ICD-10-CM

## 2013-02-25 DIAGNOSIS — Z2089 Contact with and (suspected) exposure to other communicable diseases: Secondary | ICD-10-CM

## 2013-02-25 LAB — POCT WET PREP (WET MOUNT): Clue Cells Wet Prep Whiff POC: POSITIVE

## 2013-02-25 MED ORDER — FLUCONAZOLE 150 MG PO TABS: 150.0000 mg | ORAL_TABLET | Freq: Once | ORAL | Status: DC

## 2013-02-25 MED ORDER — METRONIDAZOLE 500 MG PO TABS: 2000.0000 mg | ORAL_TABLET | Freq: Once | ORAL | Status: DC

## 2013-02-25 MED ORDER — MEDROXYPROGESTERONE ACETATE 150 MG/ML IM SUSP
150.0000 mg | Freq: Once | INTRAMUSCULAR | Status: AC
  Administered 2013-02-25: 150 mg via INTRAMUSCULAR

## 2013-02-25 NOTE — Assessment & Plan Note (Signed)
Negative upreg today.  Patient opts to get depo today.  Advised importance of reliable contraception given has been late on several depo shots due to incarceration and forgetting.  Will consider Mirena at time next depo due.

## 2013-02-25 NOTE — Patient Instructions (Addendum)
You need to schedule an appointment for Mirena IUD when you are due for your next depo shot.  It is a safer, more reliable birth control method.  We will call you for any positive STD test results.  If you do not get a letter within 2 weeks, please call our office for results

## 2013-02-25 NOTE — Assessment & Plan Note (Addendum)
Will send metronidazole and diflucan for trihcomonas and yeast  Update: pharmacy is closed, attempted to contact patient, all phone numbers out of service.  Will send letter.

## 2013-02-25 NOTE — Addendum Note (Signed)
Addended by: Macy Mis on: 02/25/2013 02:59 PM   Modules accepted: Orders

## 2013-02-25 NOTE — Addendum Note (Signed)
Addended byArlyss Repress on: 02/25/2013 11:56 AM   Modules accepted: Orders

## 2013-02-25 NOTE — Progress Notes (Signed)
  Subjective:    Patient ID: Talbert Nan, female    DOB: 06-15-1991, 22 y.o.   MRN: [id number]  HPI  Here to evaluate for STD and check pregnancy test  Last depo about 16 weeks ago on 12/12.  LMP not regular, has been spotting.  Last intercourse several weeks ago. Unprotected.  Notes some tender breasts.  Some vaginl discharge.  No dysuria, pelvic pain.  Review of Systems See HPI    Objective:   Physical Exam GEN: Alert & Oriented, No acute distress Pelvic Exam:        External: normal female genitalia without lesions or masses        Vagina: normal without lesions or masses        Cervix: normal without lesions or masses        Adnexa: normal bimanual exam without masses or fullness        Uterus: normal by palpation        Samples for Wet prep, GC/Chlamydia obtained         Assessment & Plan:

## 2013-02-25 NOTE — Assessment & Plan Note (Signed)
Encouraged protection, gave condoms today.  Will check wet prep, GC/Chlamydia, HIV, RPR today.

## 2013-02-26 ENCOUNTER — Encounter: Payer: Self-pay | Admitting: Family Medicine

## 2013-02-26 ENCOUNTER — Telehealth: Payer: Self-pay | Admitting: *Deleted

## 2013-02-26 LAB — HIV ANTIBODY (ROUTINE TESTING W REFLEX): HIV: NONREACTIVE

## 2013-02-26 MED ORDER — METRONIDAZOLE 500 MG PO TABS: 2000.0000 mg | ORAL_TABLET | Freq: Once | ORAL | Status: DC

## 2013-02-26 MED ORDER — FLUCONAZOLE 150 MG PO TABS: 150.0000 mg | ORAL_TABLET | Freq: Once | ORAL | Status: DC

## 2013-02-26 NOTE — Telephone Encounter (Signed)
Called and informed patient Rx sent to [facility] on [address].Kaitlyn Hunt, Kaitlyn Hunt

## 2013-02-26 NOTE — Telephone Encounter (Signed)
Per [name]: Pt called several times, said that we called her. Do not see in chart. Did you call her? Kaitlyn Hunt, Renato Battles

## 2013-02-26 NOTE — Telephone Encounter (Signed)
i have sent medications into pharmacy

## 2013-02-26 NOTE — Telephone Encounter (Signed)
[facility] is closed and meds need to be sent to [facility] [address]

## 2013-02-26 NOTE — Telephone Encounter (Signed)
Called pt. Informed of results and the need for treatment.  Pt's mom got really upset and would not give me another pharmacy. I explained it several times. Sorry. Lorenda Hatchet, Renato Battles

## 2013-02-26 NOTE — Telephone Encounter (Signed)
Yes- we have been trying to reach her regarding her positive trichomonas and yeast results.  We are not able to send a prescription as her pharmacy is closed ([facility])  Please let her know results, will plan to send in metronidazole 500 mg take all 4 tablets at once and 1 diflucan pill for yeast.  Contact sexual partners to have them tested, no sex for 7 days after treatment.  Still awaiting GC/Chlamydia results

## 2013-03-01 ENCOUNTER — Telehealth: Payer: Self-pay | Admitting: Family Medicine

## 2013-03-01 NOTE — Telephone Encounter (Signed)
Pt was confused because she received 2 letters 1 day apart.  Explained that first letter was sent when we could not reach her. [place], Kaitlyn Hunt

## 2013-03-01 NOTE — Telephone Encounter (Signed)
Patient is calling about conflicting results.  She just got a letter in the mail saying that "Your tests for gonorrhea and chlamydia are negative." and she would like to talk to someone about this.

## 2013-03-28 ENCOUNTER — Ambulatory Visit: Payer: Medicaid Other | Admitting: Family Medicine

## 2013-05-27 ENCOUNTER — Ambulatory Visit: Payer: Medicaid Other

## 2013-07-25 ENCOUNTER — Ambulatory Visit: Payer: Medicaid Other | Admitting: Family Medicine

## 2013-09-06 ENCOUNTER — Ambulatory Visit (INDEPENDENT_AMBULATORY_CARE_PROVIDER_SITE_OTHER): Payer: Medicaid Other | Admitting: *Deleted

## 2013-09-06 DIAGNOSIS — Z309 Encounter for contraceptive management, unspecified: Secondary | ICD-10-CM

## 2013-09-06 MED ORDER — MEDROXYPROGESTERONE ACETATE 150 MG/ML IM SUSP
150.0000 mg | Freq: Once | INTRAMUSCULAR | Status: AC
  Administered 2013-09-06: 150 mg via INTRAMUSCULAR

## 2013-09-06 NOTE — Progress Notes (Signed)
Patient in today for depo, last injection was April of this year. Urine pregnancy obtained which was negative. Patient states her last unprotected intercourse was about a month ago. Informed patient that she would need to return to clinic in 2 weeks for repeat pregnancy test. Injection given in right deltoid, patient without complaints. Next depo due January 2 - January 16, patient aware.

## 2013-09-16 ENCOUNTER — Ambulatory Visit: Payer: Medicaid Other

## 2013-10-08 ENCOUNTER — Encounter: Payer: Self-pay | Admitting: Family Medicine

## 2013-10-08 ENCOUNTER — Ambulatory Visit (INDEPENDENT_AMBULATORY_CARE_PROVIDER_SITE_OTHER): Payer: Medicaid Other | Admitting: Family Medicine

## 2013-10-08 VITALS — BP 123/73 | HR 66 | Temp 98.0°F | Ht 63.0 in | Wt 107.0 lb

## 2013-10-08 DIAGNOSIS — R011 Cardiac murmur, unspecified: Secondary | ICD-10-CM

## 2013-10-08 DIAGNOSIS — F319 Bipolar disorder, unspecified: Secondary | ICD-10-CM

## 2013-10-08 DIAGNOSIS — M25569 Pain in unspecified knee: Secondary | ICD-10-CM
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
## 2013-10-08 NOTE — Patient Instructions (Signed)
It was nice to meet you today Kaitlyn Hunt.  1.  We will start you on 50mg  Seroquel.  We are also making a referral to a psychiatrist for further mental health care.  2. For your knee pain, we recommend using ibuprofen as needed.  You can set up an appointment with Dr. Lum Babe in 2-3 weeks for her to further look at this.  3.  For the heart murmur that we heard today, we will set up an appointment here for an echocardiogram.  Please let us know if you have any questions.

## 2013-10-09 DIAGNOSIS — M25561 Pain in right knee: Secondary | ICD-10-CM | POA: Insufficient documentation

## 2013-10-09 DIAGNOSIS — M25569 Pain in unspecified knee: Secondary | ICD-10-CM | POA: Insufficient documentation

## 2013-10-09 DIAGNOSIS — F319 Bipolar disorder, unspecified: Secondary | ICD-10-CM | POA: Insufficient documentation

## 2013-10-09 MED ORDER — QUETIAPINE FUMARATE 100 MG PO TABS: 100.0000 mg | ORAL_TABLET | Freq: Every day | ORAL | Status: DC

## 2013-10-09 NOTE — Assessment & Plan Note (Signed)
Currently asymptomatic. No previous evaluation. Will obtain baseline echo and consider cardiology evaluation.

## 2013-10-09 NOTE — Assessment & Plan Note (Addendum)
SHe had done well on Seroquel in the past. Restart back at 100 mg qd and gradually taper up depending on her response to treatment. Psychiatry management strongly recommended for Bipolar. I referred her to [facility] since she is refusing to go back [facility]

## 2013-10-09 NOTE — Assessment & Plan Note (Signed)
Right more than left. Currently with mild symptoms. Etiology unclear, ?? Over use due to prolonged standing. I recommended Tylenol or Motrin prn pain with home knee exercise. RTC in 2-3 wks for reassessment, if no improve would image her knees.

## 2013-10-15 ENCOUNTER — Ambulatory Visit (HOSPITAL_COMMUNITY): Payer: Medicaid Other

## 2013-10-16 ENCOUNTER — Ambulatory Visit (HOSPITAL_COMMUNITY)
Admission: RE | Admit: 2013-10-16 | Discharge: 2013-10-16 | Disposition: A | Discharge status: Home / Self Care | Payer: Medicaid Other | Source: Ambulatory Visit | Attending: Family Medicine | Admitting: Family Medicine

## 2013-10-16 DIAGNOSIS — I379 Nonrheumatic pulmonary valve disorder, unspecified: Secondary | ICD-10-CM

## 2013-10-16 DIAGNOSIS — R011 Cardiac murmur, unspecified: Secondary | ICD-10-CM | POA: Insufficient documentation

## 2013-10-16 NOTE — Progress Notes (Signed)
Echo Lab  2D Echocardiogram completed.  Rayland Hamed L Kishana Battey, RDCS 10/16/2013 10:45 AM

## 2013-10-25 ENCOUNTER — Telehealth: Payer: Self-pay | Admitting: Family Medicine

## 2013-10-25 NOTE — Telephone Encounter (Signed)
I called and spoke with patient,ECHo report discussed with her. Patient denies any cardiac symptoms, at this time does not feed the need for cardiology referral. We will monitor for now. Patient advised to call if having any symptoms, she verbalized understanding.

## 2013-10-25 NOTE — Telephone Encounter (Signed)
I didn't call her,probably [employer] [facility] Ros[facility] calling to schedule referral. I will however call her about her ECHO result today.

## 2013-10-25 NOTE — Telephone Encounter (Signed)
Pt called and said that she has a missed call from [place]. I didn't see anything in the encounters, but she feels it was concerning her recent test. jw

## 2013-12-02 ENCOUNTER — Encounter: Payer: Medicaid Other | Admitting: Family Medicine

## 2013-12-24 ENCOUNTER — Encounter: Payer: Medicaid Other | Admitting: Family Medicine

## 2013-12-31 ENCOUNTER — Encounter: Payer: Medicaid Other | Admitting: Family Medicine

## 2014-01-13 ENCOUNTER — Ambulatory Visit (HOSPITAL_COMMUNITY): Payer: Medicaid Other | Admitting: Psychiatry

## 2014-01-25 ENCOUNTER — Telehealth: Payer: Self-pay | Admitting: Family Medicine

## 2014-01-25 NOTE — Telephone Encounter (Signed)
[place] Outside Call  Patient calls reporting that in pelvic area, the bone is swollen and "left private part" is also swollen and painful to the touch. This has been present 3 days but got worse yesterday. She denies exudate or fevers but reports it is painful with sitting or clothing rubbing against it. This has never occurred before. Denies breaking skin or any trauma to the area. She thinks it must have started when she was moving on bed and moved the wrong way. Pain is 10/10. She has tried ibuprofen and it has not helped.   A/P: Difficult to discern without exam, but possibly abscess vs cellulitis vs hernia. Afebrile and no exudate. Also very painful. H/o high risk sexual behavior and STD (trichomonas).  - Encouraged patient to go to urgent care tonight if pain is unbearable, or f/u with [facility] Monday.  - Reasons for immediate visit (fever, severe pain) discussed. Due to level of pain, I suggested Urgent Care is warranted now.  Hilton Sinclair, MD

## 2014-01-28 ENCOUNTER — Other Ambulatory Visit (HOSPITAL_COMMUNITY)
Admission: RE | Admit: 2014-01-28 | Discharge: 2014-01-28 | Disposition: A | Discharge status: Home / Self Care | Payer: Medicaid Other | Source: Ambulatory Visit | Attending: Emergency Medicine | Admitting: Emergency Medicine

## 2014-01-28 ENCOUNTER — Emergency Department (INDEPENDENT_AMBULATORY_CARE_PROVIDER_SITE_OTHER)
Admission: EM | Admit: 2014-01-28 | Discharge: 2014-01-28 | Disposition: A | Discharge status: Home / Self Care | Payer: Medicaid Other | Source: Home / Self Care | Attending: Emergency Medicine | Admitting: Emergency Medicine

## 2014-01-28 ENCOUNTER — Encounter (HOSPITAL_COMMUNITY): Payer: Self-pay | Admitting: Emergency Medicine

## 2014-01-28 DIAGNOSIS — Z113 Encounter for screening for infections with a predominantly sexual mode of transmission: Secondary | ICD-10-CM | POA: Insufficient documentation

## 2014-01-28 DIAGNOSIS — N751 Abscess of Bartholin's gland: Secondary | ICD-10-CM

## 2014-01-28 DIAGNOSIS — N760 Acute vaginitis: Secondary | ICD-10-CM | POA: Insufficient documentation

## 2014-01-28 LAB — CERVICOVAGINAL ANCILLARY ONLY
WET PREP (BD AFFIRM): POSITIVE — AB
Wet Prep (BD Affirm): NEGATIVE
Wet Prep (BD Affirm): POSITIVE — AB

## 2014-01-28 MED ORDER — NAPROXEN 500 MG PO TABS: 500.0000 mg | ORAL_TABLET | Freq: Two times a day (BID) | ORAL | Status: DC

## 2014-01-28 MED ORDER — METRONIDAZOLE 500 MG PO TABS: 500.0000 mg | ORAL_TABLET | Freq: Two times a day (BID) | ORAL | Status: DC

## 2014-01-28 NOTE — ED Provider Notes (Signed)
CSN: [id number]     Arrival date & time 01/28/14  6734 History   First MD Initiated Contact with Patient 01/28/14 1016     Chief Complaint  Patient presents with  . Pelvic Pain   (Consider location/radiation/quality/duration/timing/severity/associated sxs/prior Treatment) HPI Comments: Reports 6 days of vaginal pain and swelling with associated vaginal discharge. No fever or vaginal bleeding. No urinary symptoms. Denies abdominal pain, N/V/D/C. No previous episodes. LNMP: on Depo-Provera  Patient is a 23 y.o. female presenting with pelvic pain. The history is provided by the patient.  Pelvic Pain    Past Medical History  Diagnosis Date  . Bipolar 1 disorder     previously on Depakote D/C 5/11 bc pt stated it made her feel lazy   . Eclampsia in pregnancy    Past Surgical History  Procedure Laterality Date  . Cesarean section     Family History  Problem Relation Age of Onset  . Bipolar disorder Mother   . Bipolar disorder Maternal Aunt   . Bipolar disorder Sister    History  Substance Use Topics  . Smoking status: Current Every Day Smoker -- 0.50 packs/day    Types: Cigarettes  . Smokeless tobacco: Not on file  . Alcohol Use: No   OB History   Grav Para Term Preterm Abortions TAB SAB Ect Mult Living   1 1  1            Review of Systems  Genitourinary: Positive for pelvic pain.  All other systems reviewed and are negative.    Allergies  Review of patient's allergies indicates no known allergies.  Home Medications   Current Outpatient Rx  Name  Route  Sig  Dispense  Refill  . medroxyPROGESTERone (DEPO-PROVERA) 150 MG/ML injection   Intramuscular   Inject 150 mg into the muscle every 3 (three) months.         . naproxen (NAPROSYN) 500 MG tablet   Oral   Take 1 tablet (500 mg total) by mouth 2 (two) times daily with a meal. As needed for pain   20 tablet   0   . QUEtiapine (SEROQUEL) 100 MG tablet   Oral   Take 1 tablet (100 mg total) by mouth at  bedtime.   30 tablet   1   . EXPIRED: QUEtiapine (SEROQUEL) 200 MG tablet   Oral   Take 1 tablet (200 mg total) by mouth at bedtime.   30 tablet   1   . EXPIRED: QUEtiapine (SEROQUEL) 300 MG tablet   Oral   Take 1 tablet (300 mg total) by mouth at bedtime.   30 tablet   1    BP 135/79  Pulse 61  Temp(Src) 98.1 F (36.7 C) (Oral)  Resp 18  SpO2 100%  LMP 01/24/2014 Physical Exam  Nursing note and vitals reviewed. Constitutional: She is oriented to person, place, and time. She appears well-developed and well-nourished. No distress.  HENT:  Head: Normocephalic and atraumatic.  Eyes: Conjunctivae are normal.  Neck: Normal range of motion.  Cardiovascular: Normal rate.   Pulmonary/Chest: Effort normal.  Abdominal: Soft. Bowel sounds are normal. She exhibits no distension. There is no tenderness. Hernia confirmed negative in the right inguinal area and confirmed negative in the left inguinal area.  Genitourinary:    Pelvic exam was performed with patient supine. There is no rash, tenderness, lesion or injury on the right labia. There is tenderness and lesion on the left labia. There is no rash or injury on  the left labia. There is erythema and tenderness around the vagina. No bleeding around the vagina. No foreign body around the vagina. No signs of injury around the vagina. Vaginal discharge found.  Moderate thick yellow discharge  Musculoskeletal: Normal range of motion.  Lymphadenopathy:       Right: No inguinal adenopathy present.       Left: No inguinal adenopathy present.  Neurological: She is alert and oriented to person, place, and time.  Skin: Skin is warm and dry.  Psychiatric: She has a normal mood and affect. Her behavior is normal.    ED Course  INCISION AND DRAINAGE Date/Time: 01/28/2014 12:31 PM Performed by: Griselda Miner LEE Authorized by: Griselda Miner LEE Consent: Verbal consent obtained. Risks and benefits: risks, benefits and alternatives  were discussed Consent given by: patient Patient understanding: patient states understanding of the procedure being performed Patient identity confirmed: verbally with patient and arm band Time out: Immediately prior to procedure a "time out" was called to verify the correct patient, procedure, equipment, support staff and site/side marked as required. Type: abscess Body area: anogenital Location details: Bartholin's gland Anesthesia: local infiltration Local anesthetic: lidocaine 2% without epinephrine Anesthetic total: 3 ml Patient sedated: no Scalpel size: 11 Needle gauge: 25g. Incision type: single straight Complexity: simple Drainage: purulent Drainage amount: copious Wound treatment: drain placed (Word catheter) Patient tolerance: Patient tolerated the procedure well with no immediate complications.   (including critical care time) Labs Review Labs Reviewed  CERVICOVAGINAL ANCILLARY ONLY   Imaging Review No results found.   MDM   1. Bartholin's gland abscess    Sitz baths at home. Return in 2-3 days for Word catheter removal. Naprosyn as prescribed for pain. Vaginal cultures sent with treatment to be based upon results.   [place], [place] 01/28/14 [phone]

## 2014-01-28 NOTE — ED Notes (Signed)
Affirm: Gardnerella and Trich pos., Candida neg.  Message sent to [facility] PA. Roselyn Meier 01/28/2014

## 2014-01-28 NOTE — Discharge Instructions (Signed)
Bartholin's Cyst or Abscess Bartholin's glands are small glands located within the folds of skin (labia) along the sides of the lower opening of the vagina (birth canal). A cyst may develop when the duct of the gland becomes blocked. When this happens, fluid that accumulates within the cyst can become infected. This is known as an abscess. The Bartholin gland produces a mucous fluid to lubricate the outside of the vagina during sexual intercourse. SYMPTOMS   Patients with a small cyst may not have any symptoms.  Mild discomfort to severe pain depending on the size of the cyst and if it is infected (abscess).  Pain, redness, and swelling around the lower opening of the vagina.  Painful intercourse.  Pressure in the perineal area.  Swelling of the lips of the vagina (labia).  The cyst or abscess can be on one side or both sides of the vagina. DIAGNOSIS   A large swelling is seen in the lower vagina area by your caregiver.  Painful to touch.  Redness and pain, if it is an abscess. TREATMENT   Sometimes the cyst will go away on its own.  Apply warm wet compresses to the area or take hot sitz baths several times a day.  An incision to drain the cyst or abscess with local anesthesia.  Culture the pus, if it is an abscess.  Antibiotic treatment, if it is an abscess.  Cut open the gland and suture the edges to make the opening of the gland bigger (marsupialization).  Remove the whole gland if the cyst or abscess returns. PREVENTION   Practice good hygiene.  Clean the vaginal area with a mild soap and soft cloth when bathing.  Do not rub hard in the vaginal area when bathing.  Protect the crotch area with a padded cushion if you take long bike rides or ride horses.  Be sure you are well lubricated when you have sexual intercourse. HOME CARE INSTRUCTIONS   If your cyst or abscess was opened, a small piece of gauze, or a drain, may have been placed in the wound to allow  drainage. Do not remove this gauze or drain unless directed by your caregiver.  Wear feminine pads, not tampons, as needed for any drainage or bleeding.  If antibiotics were prescribed, take them exactly as directed. Finish the entire course.  Only take over-the-counter or prescription medicines for pain, discomfort, or fever as directed by your caregiver. SEEK IMMEDIATE MEDICAL CARE IF:   You have an increase in pain, redness, swelling, or drainage.  You have bleeding from the wound which results in the use of more than the number of pads suggested by your caregiver in 24 hours.  You have chills.  You have a fever.  You develop any new problems (symptoms) or aggravation of your existing condition. MAKE SURE YOU:   Understand these instructions.  Will watch your condition.  Will get help right away if you are not doing well or get worse. Document Released: 11/07/2005 Document Revised: 01/30/2012 Document Reviewed: 06/25/2008 [facility] Patient Information 2014 [place].  Please return in 3 days to have your catheter removed

## 2014-01-28 NOTE — ED Notes (Signed)
C/o pelvic pain since Wednesday States she move a certain way and feels as if she pulled a muscle States there is a bone that is sticking up States vaginal area is swollen Denies any urinary problems, abd pain or bowel movements

## 2014-01-28 NOTE — ED Provider Notes (Signed)
Medical screening examination/treatment/procedure(s) were performed by non-physician practitioner and as supervising physician I was immediately available for consultation/collaboration.  Philipp Deputy, M.D.  Harden Mo, MD 01/28/14 1754

## 2014-01-29 ENCOUNTER — Ambulatory Visit: Payer: Medicaid Other | Admitting: Family Medicine

## 2014-01-29 ENCOUNTER — Telehealth (HOSPITAL_COMMUNITY): Payer: Self-pay | Admitting: *Deleted

## 2014-01-29 LAB — CERVICOVAGINAL ANCILLARY ONLY
Chlamydia: NEGATIVE
NEISSERIA GONORRHEA: NEGATIVE

## 2014-01-29 NOTE — ED Notes (Addendum)
I called pt. Pt. verified x 2 and given results.  Pt. told she needs Flagyl for bacterial vaginosis and Trich.   Pt. instructed to no alcohol while taking this medication.  Instructed to notify her partner to be treated with Flagyl, no sex until she has finished her medication and her partner has been treated and to practice safe sex. Told she can get HIV testing at the [facility] STD clinic, by appointment.  Pt. told where to pick up her Rx.  Kaitlyn Hunt 01/29/2014 GC/Chlamydia neg. 01/29/2014

## 2014-02-05 ENCOUNTER — Telehealth: Payer: Self-pay | Admitting: Family Medicine

## 2014-02-05 NOTE — Telephone Encounter (Signed)
Please let patient know she has psychiatry appointment coming up, from last discussion with her she need refill medication from her psych doctor. Here is her appointment: Have her follow up with this appointment for refill, if she need some refill prior to them to let us know then I can give few day supply.  Pt has an appt on 01/13/2014 if pt is unable to go to her appt need to call [phone] to [facility].

## 2014-02-05 NOTE — Telephone Encounter (Signed)
Pt called and would like a refill on her Seroquel jw

## 2014-02-05 NOTE — Telephone Encounter (Signed)
Encounter closed by mistake too early.  Will forward to MD for refill. Silvestre Mines,CMA

## 2014-02-06 ENCOUNTER — Other Ambulatory Visit: Payer: Self-pay | Admitting: Family Medicine

## 2014-02-06 MED ORDER — QUETIAPINE FUMARATE 100 MG PO TABS: 100.0000 mg | ORAL_TABLET | Freq: Every day | ORAL | Status: DC

## 2014-02-06 NOTE — Telephone Encounter (Signed)
Number given to patient to call and reschedule appt since she wasn't able to make it to her 01-13-14 appt @[facility] .  She will call and schedule but still needs a refill on her medication til then.  She has been out of medication.  Coden Franchi,CMA

## 2014-02-06 NOTE — Telephone Encounter (Signed)
Mother and patient are aware that this has been taken care of and that she will need to make that appt with psych. Jazmin [name],CMA

## 2014-02-06 NOTE — Telephone Encounter (Signed)
Thanks, let patient know her refill is for 1 month, it is her responsibility to reschedule her appointment with Psych, if she does not reschedule we would not be refilling her psych medication for Bipolar.

## 2014-02-18 ENCOUNTER — Encounter: Payer: Medicaid Other | Admitting: Family Medicine

## 2014-04-07 ENCOUNTER — Telehealth: Payer: Self-pay | Admitting: Family Medicine

## 2014-04-07 MED ORDER — QUETIAPINE FUMARATE 100 MG PO TABS: 100.0000 mg | ORAL_TABLET | Freq: Every day | ORAL | Status: DC

## 2014-04-07 NOTE — Telephone Encounter (Signed)
Pt returned dr Annett Gula calle

## 2014-04-07 NOTE — Telephone Encounter (Signed)
I called patient she stated she tried scheduling appointment with [facility] to be seen but she was told she cannot be seen till Aug hence requesting that I do her refill, I suggested to her due to her Diagnosis of Bipolar I prefer she be managed by a psychiatrist, she stated she has been doing well on her current dose of Seroquel hence does not need to see psych. As discussed with her I can do her refill till she sees a Teacher, music, I also mentioned to her I will call [facility] to discuss her appointment.   I called and spoke with [facility] staff who stated she does not need appointment, she mentioned patient had called and she was informed all she needed to do is to walk in to their clinic between 8 am and 3 pm from M-F to reestablish care. I called patient back and she stated she does not have time to go there and be waiting to be seen, I advised her I will give her refill while she is trying to reestablish care, as discussed with her I want her to been seen by Psychiatrist at least once documenting that this is an ok medication for her. I reviewed and discussed side effects of Seroquel with her, she is aware of this, she denies any concern with taking this medication hence I will refill her medicine.  She seem stable on 100 mg dose, plan to increase as needed.

## 2014-04-07 NOTE — Telephone Encounter (Signed)
Wants to talk to dr Gwendlyn Deutscher about her ceraquil. She is almost out. She has been referred to behavioral health but they told her Dr Gwendlyn Deutscher would Be the one to prescibe it

## 2014-04-24 ENCOUNTER — Ambulatory Visit: Payer: Medicaid Other

## 2014-04-25 ENCOUNTER — Encounter: Payer: Medicaid Other | Admitting: Family Medicine

## 2014-05-06 ENCOUNTER — Ambulatory Visit (INDEPENDENT_AMBULATORY_CARE_PROVIDER_SITE_OTHER): Payer: Medicaid Other | Admitting: Family Medicine

## 2014-05-06 ENCOUNTER — Other Ambulatory Visit (HOSPITAL_COMMUNITY)
Admission: RE | Admit: 2014-05-06 | Discharge: 2014-05-06 | Disposition: A | Discharge status: Home / Self Care | Payer: Medicaid Other | Source: Ambulatory Visit | Attending: Family Medicine | Admitting: Family Medicine

## 2014-05-06 ENCOUNTER — Encounter: Payer: Self-pay | Admitting: Family Medicine

## 2014-05-06 VITALS — BP 133/84 | HR 64 | Temp 98.0°F | Wt 99.8 lb

## 2014-05-06 DIAGNOSIS — Z309 Encounter for contraceptive management, unspecified: Secondary | ICD-10-CM

## 2014-05-06 DIAGNOSIS — Z124 Encounter for screening for malignant neoplasm of cervix: Secondary | ICD-10-CM | POA: Insufficient documentation

## 2014-05-06 DIAGNOSIS — Z1151 Encounter for screening for human papillomavirus (HPV): Secondary | ICD-10-CM | POA: Insufficient documentation

## 2014-05-06 DIAGNOSIS — Z0000 Encounter for general adult medical examination without abnormal findings: Secondary | ICD-10-CM

## 2014-05-06 DIAGNOSIS — IMO0001 Reserved for inherently not codable concepts without codable children: Secondary | ICD-10-CM | POA: Insufficient documentation

## 2014-05-06 DIAGNOSIS — R87810 Cervical high risk human papillomavirus (HPV) DNA test positive: Secondary | ICD-10-CM | POA: Insufficient documentation

## 2014-05-06 DIAGNOSIS — Z23 Encounter for immunization: Secondary | ICD-10-CM

## 2014-05-06 DIAGNOSIS — Z113 Encounter for screening for infections with a predominantly sexual mode of transmission: Secondary | ICD-10-CM | POA: Insufficient documentation

## 2014-05-06 LAB — RPR

## 2014-05-06 LAB — POCT URINE PREGNANCY: PREG TEST UR: NEGATIVE

## 2014-05-06 MED ORDER — MEDROXYPROGESTERONE ACETATE 150 MG/ML IM SUSP
150.0000 mg | Freq: Once | INTRAMUSCULAR | Status: AC
  Administered 2014-05-06: 150 mg via INTRAMUSCULAR

## 2014-05-06 NOTE — Addendum Note (Signed)
Addended by: [name] on: 05/06/2014 10:21 AM   Modules accepted: Orders

## 2014-05-06 NOTE — Progress Notes (Signed)
Patient ID: Kaitlyn Hunt, female   DOB: November 23, 1990, 23 y.o.   MRN: [id number] Subjective:     Kaitlyn Hunt is a 23 y.o. female and is here for a comprehensive physical exam. The patient reports no problems.  History   Social History  . Marital Status: Single    Spouse Name: N/A    Number of Children: 0  . Years of Education: 10   Occupational History  .     Social History Main Topics  . Smoking status: Current Every Day Smoker -- 0.50 packs/day    Types: Cigarettes  . Smokeless tobacco: Not on file  . Alcohol Use: No  . Drug Use: No  . Sexual Activity: Yes    Partners: Male    Birth Control/ Protection: Injection     Comment: with boyfriend   Other Topics Concern  . Not on file   Social History Narrative   Lives at home with mom. [name]) is involved.          Health Maintenance  Topic Date Due  . Tetanus/tdap  08/12/2008  . Pap Smear  08/20/2013  . Influenza Vaccine  06/21/2014    The following portions of the patient's history were reviewed and updated as appropriate: allergies, current medications, past family history, past medical history, past social history, past surgical history and problem list.  Review of Systems Pertinent items are noted in HPI.   Objective:    BP 133/84  Pulse 64  Temp(Src) 98 F (36.7 C) (Oral)  Wt 99 lb 12.8 oz (45.269 kg)  LMP 05/04/2014 General appearance: alert Head: Normocephalic, without obvious abnormality, atraumatic Eyes: conjunctivae/corneas clear. PERRL, EOM's intact. Fundi benign. Ears: normal TM's and external ear canals both ears Throat: lips, mucosa, and tongue normal; teeth and gums normal Neck: no adenopathy, no carotid bruit, no JVD, supple, symmetrical, trachea midline and thyroid not enlarged, symmetric, no tenderness/mass/nodules Back: symmetric, no curvature. ROM normal. No CVA tenderness. Lungs: clear to auscultation bilaterally Heart: regular rate and rhythm, S1, S2 normal, no  murmur, click, rub or gallop Abdomen: soft, non-tender; bowel sounds normal; no masses,  no organomegaly Pelvic: cervix normal in appearance, external genitalia normal, no adnexal masses or tenderness, no cervical motion tenderness, rectovaginal septum normal, uterus normal size, shape, and consistency and vagina normal without discharge Extremities: extremities normal, atraumatic, no cyanosis or edema Pulses: 2+ and symmetric Skin: Skin color, texture, turgor normal. No rashes or lesions Neurologic: Alert and oriented X 3, normal strength and tone. Normal symmetric reflexes. Normal coordination and gait    Assessment:    Healthy female exam. Normal exam      Plan:     See After Visit Summary for Counseling Recommendations and problem list

## 2014-05-06 NOTE — Assessment & Plan Note (Signed)
Normal exam except for underweight. PAP done today with GC/Chlamydia Tdap vaccine recommended. HIV and RPR check since she is sexually active. Patient is underweight, counseling done on diet. I recommended adding 2% mild TID to her diet. F/U in 4 wk for weight assessment. Depo shot today if pregnancy test is negative.

## 2014-05-06 NOTE — Patient Instructions (Signed)
It was nice seeing you today Kaitlyn Hunt, I am however concern about your weight. You seem to have lost couple of pounds in the last few months, I will recommend eating at least 3 times daily and add milk to diet 3 times daily. I will like to see you in 4 wks for weigh check.

## 2014-05-07 LAB — HIV ANTIBODY (ROUTINE TESTING W REFLEX): HIV: NONREACTIVE

## 2014-05-08 LAB — CYTOLOGY - PAP

## 2014-05-12 ENCOUNTER — Telehealth: Payer: Self-pay | Admitting: Family Medicine

## 2014-05-12 NOTE — Telephone Encounter (Signed)
PAP result: HPV + and ASCUS. All question answered. I recommended repeat PAP in 1 yr.

## 2014-07-29 ENCOUNTER — Ambulatory Visit: Payer: Medicaid Other

## 2014-07-30 ENCOUNTER — Ambulatory Visit: Payer: Medicaid Other

## 2014-08-20 ENCOUNTER — Ambulatory Visit: Payer: Medicaid Other

## 2014-09-15 ENCOUNTER — Encounter (HOSPITAL_COMMUNITY): Payer: Self-pay | Admitting: Emergency Medicine

## 2014-09-15 ENCOUNTER — Emergency Department (HOSPITAL_COMMUNITY)
Admission: EM | Admit: 2014-09-15 | Discharge: 2014-09-15 | Disposition: A | Discharge status: Home / Self Care | Payer: Medicaid Other | Source: Home / Self Care

## 2014-09-15 DIAGNOSIS — N751 Abscess of Bartholin's gland: Secondary | ICD-10-CM

## 2014-09-15 MED ORDER — KETOROLAC TROMETHAMINE 60 MG/2ML IM SOLN
60.0000 mg | Freq: Once | INTRAMUSCULAR | Status: AC
  Administered 2014-09-15: 60 mg via INTRAMUSCULAR

## 2014-09-15 MED ORDER — KETOROLAC TROMETHAMINE 60 MG/2ML IM SOLN
INTRAMUSCULAR | Status: AC
  Filled 2014-09-15: qty 2

## 2014-09-15 NOTE — ED Notes (Signed)
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

## 2014-09-15 NOTE — Discharge Instructions (Signed)
Please report to [facility] for evaluation.   Bartholin's Cyst or Abscess Bartholin's glands are small glands located within the folds of skin (labia) along the sides of the lower opening of the vagina (birth canal). A cyst may develop when the duct of the gland becomes blocked. When this happens, fluid that accumulates within the cyst can become infected. This is known as an abscess. The Bartholin gland produces a mucous fluid to lubricate the outside of the vagina during sexual intercourse. SYMPTOMS   Patients with a small cyst may not have any symptoms.  Mild discomfort to severe pain depending on the size of the cyst and if it is infected (abscess).  Pain, redness, and swelling around the lower opening of the vagina.  Painful intercourse.  Pressure in the perineal area.  Swelling of the lips of the vagina (labia).  The cyst or abscess can be on one side or both sides of the vagina. DIAGNOSIS   A large swelling is seen in the lower vagina area by your caregiver.  Painful to touch.  Redness and pain, if it is an abscess. TREATMENT   Sometimes the cyst will go away on its own.  Apply warm wet compresses to the area or take hot sitz baths several times a day.  An incision to drain the cyst or abscess with local anesthesia.  Culture the pus, if it is an abscess.  Antibiotic treatment, if it is an abscess.  Cut open the gland and suture the edges to make the opening of the gland bigger (marsupialization).  Remove the whole gland if the cyst or abscess returns. PREVENTION   Practice good hygiene.  Clean the vaginal area with a mild soap and soft cloth when bathing.  Do not rub hard in the vaginal area when bathing.  Protect the crotch area with a padded cushion if you take long bike rides or ride horses.  Be sure you are well lubricated when you have sexual intercourse. HOME CARE INSTRUCTIONS   If your cyst or abscess was opened, a small piece of gauze, or a  drain, may have been placed in the wound to allow drainage. Do not remove this gauze or drain unless directed by your caregiver.  Wear feminine pads, not tampons, as needed for any drainage or bleeding.  If antibiotics were prescribed, take them exactly as directed. Finish the entire course.  Only take over-the-counter or prescription medicines for pain, discomfort, or fever as directed by your caregiver. SEEK IMMEDIATE MEDICAL CARE IF:   You have an increase in pain, redness, swelling, or drainage.  You have bleeding from the wound which results in the use of more than the number of pads suggested by your caregiver in 24 hours.  You have chills.  You have a fever.  You develop any new problems (symptoms) or aggravation of your existing condition. MAKE SURE YOU:   Understand these instructions.  Will watch your condition.  Will get help right away if you are not doing well or get worse. Document Released: 11/07/2005 Document Revised: 01/30/2012 Document Reviewed: 06/25/2008 [facility] Patient Information 2015 [place], [place]. This information is not intended to replace advice given to you by your health care provider. Make sure you discuss any questions you have with your health care provider.

## 2014-09-15 NOTE — ED Provider Notes (Signed)
Medical screening examination/treatment/procedure(s) were performed by resident physician or non-physician practitioner and as supervising physician I was immediately available for consultation/collaboration.   Pauline Good MD.   Billy Fischer, MD 09/15/14 [phone]

## 2014-09-15 NOTE — ED Provider Notes (Signed)
CSN: [id number]     Arrival date & time 09/15/14  1321 History   None    Chief Complaint  Patient presents with  . Cyst   (Consider location/radiation/quality/duration/timing/severity/associated sxs/prior Treatment) HPI Comments: Patient reports 6 days of vaginal pain and swelling. States she has had similar episodes in the past that have required incision and drainage. States that while this episode is similar, symptoms (pain and swelling) is more severe.  Denies vaginal bleeding or discharge Denies urinary symptoms Has PCP and GYN provider, but has not discussed condition with either of these providers.   The history is provided by the patient.    Past Medical History  Diagnosis Date  . Bipolar 1 disorder     previously on Depakote D/C 5/11 bc pt stated it made her feel lazy   . Eclampsia in pregnancy    Past Surgical History  Procedure Laterality Date  . Cesarean section     Family History  Problem Relation Age of Onset  . Bipolar disorder Mother   . Bipolar disorder Maternal Aunt   . Bipolar disorder Sister    History  Substance Use Topics  . Smoking status: Current Every Day Smoker -- 0.50 packs/day    Types: Cigarettes  . Smokeless tobacco: Not on file  . Alcohol Use: No   OB History   Grav Para Term Preterm Abortions TAB SAB Ect Mult Living   1 1  1            Review of Systems  Allergies  Review of patient's allergies indicates no known allergies.  Home Medications   Prior to Admission medications   Medication Sig Start Date End Date Taking? Authorizing Provider  medroxyPROGESTERone (DEPO-PROVERA) 150 MG/ML injection Inject 150 mg into the muscle every 3 (three) months. 08/02/11   Katherina Mires, MD  QUEtiapine (SEROQUEL) 100 MG tablet Take 1 tablet (100 mg total) by mouth at bedtime. 04/07/14   Andrena Mews, MD   BP 128/64  Pulse 62  Temp(Src) 97.9 F (36.6 C) (Oral)  Resp 14  SpO2 100% Physical Exam  Nursing note and vitals  reviewed. Constitutional: She appears well-developed and well-nourished. No distress.  HENT:  Head: Normocephalic and atraumatic.  Eyes: Conjunctivae are normal. No scleral icterus.  Cardiovascular: Normal rate, regular rhythm and normal heart sounds.   Pulmonary/Chest: Effort normal and breath sounds normal.  Abdominal: Soft. Bowel sounds are normal. She exhibits no distension. There is no tenderness.  Genitourinary: There is no rash, tenderness or lesion on the right labia. There is tenderness and lesion on the left labia. There is no rash on the left labia. There is erythema and tenderness around the vagina. No bleeding around the vagina. No vaginal discharge found.  Declines speculum exam. Is afraid pain and swelling will make exam intolerable.  Has what appears to be a large bartholins abscess that extends intravaginally  Musculoskeletal: She exhibits no edema and no tenderness.  Skin: Skin is warm and dry.  Se GU exam  Psychiatric: She has a normal mood and affect. Her behavior is normal.    ED Course  Procedures (including critical care time) Labs Review Labs Reviewed - No data to display  Imaging Review No results found.   MDM   1. 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 Patient advised to have her mother transport her to [facility] for evaluation, probable incision and drainage and possible Word catheter placement.     Annett Gula  Renelda Loma, PA 09/15/14 1525

## 2014-09-22 ENCOUNTER — Encounter (HOSPITAL_COMMUNITY): Payer: Self-pay | Admitting: Emergency Medicine

## 2014-09-23 ENCOUNTER — Encounter: Payer: Self-pay | Admitting: Family Medicine

## 2014-09-23 ENCOUNTER — Ambulatory Visit (INDEPENDENT_AMBULATORY_CARE_PROVIDER_SITE_OTHER): Payer: Medicaid Other | Admitting: Family Medicine

## 2014-09-23 VITALS — BP 137/79 | HR 76 | Temp 97.9°F | Ht 63.0 in | Wt 107.0 lb

## 2014-09-23 DIAGNOSIS — N926 Irregular menstruation, unspecified: Secondary | ICD-10-CM

## 2014-09-23 DIAGNOSIS — N912 Amenorrhea, unspecified: Secondary | ICD-10-CM

## 2014-09-23 HISTORY — DX: Amenorrhea, unspecified: N91.2

## 2014-09-23 LAB — POCT URINE PREGNANCY: Preg Test, Ur: NEGATIVE

## 2014-09-23 NOTE — Patient Instructions (Signed)
It was nice seeing you today, I am sorry about your irregular period, it could be from missed Depo. I will recommend you get your depo shot today since pregnancy test is negative, but at your request we will wait two more weeks. Please follow up in 2 wks.

## 2014-09-23 NOTE — Assessment & Plan Note (Signed)
For 1 month. She likely had breakthrough bleeding in Sept due to missed dose of Depo. Pregnancy test done today was negative. I offered depo shot but she declined, she prefer to recheck pregnancy test in 2 wks just to be certain she is not pregnant. I recommended use of condom in the mean time.

## 2014-09-23 NOTE — Progress Notes (Signed)
Subjective:     Patient ID: Kaitlyn Hunt, female   DOB: 05-15-1991, 23 y.o.   MRN: [id number]  HPI  Missed period: c/o missed period since Sept. She had period twice in Sept after she missed her Depo shot, her LMP was Sept 23rd. No vaginal discharge. Sexually active, no use of condoms. No home pregnancy test done. She has nipple soreness and pelvic cramping on and off, feel she might be pregnant  Current Outpatient Prescriptions on File Prior to Visit  Medication Sig Dispense Refill  . medroxyPROGESTERone (DEPO-PROVERA) 150 MG/ML injection Inject 150 mg into the muscle every 3 (three) months.    . QUEtiapine (SEROQUEL) 100 MG tablet Take 1 tablet (100 mg total) by mouth at bedtime. 30 tablet 2   No current facility-administered medications on file prior to visit.   Past Medical History  Diagnosis Date  . Bipolar 1 disorder     previously on Depakote D/C 5/11 bc pt stated it made her feel lazy   . Eclampsia in pregnancy       Review of Systems  Respiratory: Negative.   Cardiovascular: Negative.   Gastrointestinal: Negative.   Genitourinary: Positive for menstrual problem. Negative for frequency, flank pain, vaginal discharge, vaginal pain and pelvic pain.  All other systems reviewed and are negative.      Filed Vitals:   09/23/14 1129  BP: 137/79  Pulse: 76  Temp: 97.9 F (36.6 C)  TempSrc: Oral  Height: 5\' 3"  (1.6 m)  Weight: 107 lb (48.535 kg)    Objective:   Physical Exam  Constitutional: She is oriented to person, place, and time. She appears well-developed. No distress.  Cardiovascular: Normal rate, regular rhythm and normal heart sounds.   No murmur heard. Pulmonary/Chest: Effort normal and breath sounds normal. No respiratory distress. She has no wheezes.  Abdominal: Soft. Bowel sounds are normal. She exhibits no distension and no mass. There is no tenderness. There is no guarding.  Neurological: She is alert and oriented to person, place, and time.   Nursing note and vitals reviewed.      Assessment:     Amenorrhea     Plan:     Check problem list.

## 2014-10-15 ENCOUNTER — Encounter: Payer: Self-pay | Admitting: Family Medicine

## 2014-10-15 ENCOUNTER — Ambulatory Visit (INDEPENDENT_AMBULATORY_CARE_PROVIDER_SITE_OTHER): Payer: Medicaid Other | Admitting: Family Medicine

## 2014-10-15 VITALS — BP 130/86 | HR 72 | Temp 97.6°F | Wt 103.0 lb

## 2014-10-15 DIAGNOSIS — N939 Abnormal uterine and vaginal bleeding, unspecified: Secondary | ICD-10-CM | POA: Insufficient documentation

## 2014-10-15 LAB — POCT URINE PREGNANCY: Preg Test, Ur: NEGATIVE

## 2014-10-15 MED ORDER — MEDROXYPROGESTERONE ACETATE 10 MG PO TABS: 10.0000 mg | ORAL_TABLET | Freq: Every day | ORAL | Status: DC

## 2014-10-15 NOTE — Patient Instructions (Signed)
It was nice to see you today.  Take the medication as prescribed.  Please follow up with your PCP if you continue to have heavy bleeding.

## 2014-10-15 NOTE — Progress Notes (Signed)
   Subjective:    Patient ID: Kaitlyn Hunt, female    DOB: 1991-05-05, 23 y.o.   MRN: [id number]  HPI 23 year old female presents for same day appointment with complaints of heavy vaginal bleeding.  1) Vaginal Bleeding  Patient reports that she was recently seen in November after missing her menstrual cycle in September.  She reports that she has some spotting on November 12 of November 14.  This is Manuela Schwartz she developed heavy menstrual bleeding with the passage of clots (this occurred after an episode of heavy lifting).  She states that she was due for her menstrual cycle on the 17th. She states this is very atypical for her mental cycles.   She denies current sexual activity.  No reported abdominal pain. No recent fevers, chills, nausea, vomiting. No dysuria.   Review of Systems Per HPI    Objective:   Physical Exam Filed Vitals:   10/15/14 1548  BP: 130/86  Pulse: 72  Temp: 97.6 F (36.4 C)   Exam: General: angry appearing female in NAD.  Psych: angry, flat affect, depressed mood.    Assessment & Plan:  See Problem List

## 2014-10-15 NOTE — Assessment & Plan Note (Signed)
Heavy vaginal bleeding in the setting of fairly recent Depo-Provera use. Patient very angry and abrupt during office visit today. She is very concerned and feels like this is not her menstrual cycle. Difficult history to obtain given above. Urine pregnancy negative today. Patient is very upset and would like to bleeding to subside. Will treatment with Provera 10 mg 10 days for cessation of heavy bleeding. Patient to follow-up with PCP closely.

## 2014-12-05 ENCOUNTER — Telehealth: Payer: Self-pay | Admitting: Family Medicine

## 2014-12-05 NOTE — Telephone Encounter (Signed)
Patient reports her bartholin's abscess is reoccurring again. She feels that he seems to happen around the time of her period. She was seen in the urgent care in March and October of last year for bartholin;s abscess and in October they advised her to go to [facility] hospital for I&D and possible placement of Word catheter. She did not go last time because she states it "popped" on the way to [facility] hospital so she did not seek further treatment. Patient reports moderate pain, without fever or drainage. She states it feels like it is getting large again.  - Advised pt to be seen at [facility] hospital for evaluation. Discussed they may need to place her on antibiotics and  perform an I&D and possible Word catheter placement. Pt voiced understanding.   Maryana Pittmon DO

## 2015-01-02 ENCOUNTER — Ambulatory Visit (INDEPENDENT_AMBULATORY_CARE_PROVIDER_SITE_OTHER): Payer: Medicaid Other | Admitting: Family Medicine

## 2015-01-02 ENCOUNTER — Encounter: Payer: Self-pay | Admitting: Family Medicine

## 2015-01-02 VITALS — BP 115/69 | HR 66 | Temp 98.1°F | Ht 63.0 in | Wt 103.0 lb

## 2015-01-02 DIAGNOSIS — T148 Other injury of unspecified body region: Secondary | ICD-10-CM

## 2015-01-02 DIAGNOSIS — T148XXA Other injury of unspecified body region, initial encounter: Secondary | ICD-10-CM | POA: Insufficient documentation

## 2015-01-02 NOTE — Patient Instructions (Signed)
Muscle Strain A muscle strain (pulled muscle) happens when a muscle is stretched beyond normal length. It happens when a sudden, violent force stretches your muscle too far. Usually, a few of the fibers in your muscle are torn. Muscle strain is common in athletes. Recovery usually takes 1-2 weeks. Complete healing takes 5-6 weeks.  HOME CARE   Follow the PRICE method of treatment to help your injury get better. Do this the first 2-3 days after the injury:  Protect. Protect the muscle to keep it from getting injured again.  Rest. Limit your activity and rest the injured body part.  Ice. Put ice in a plastic bag. Place a towel between your skin and the bag. Then, apply the ice and leave it on from 15-20 minutes each hour. After the third day, switch to moist heat packs.  Compression. Use a splint or elastic bandage on the injured area for comfort. Do not put it on too tightly.  Elevate. Keep the injured body part above the level of your heart.  Only take medicine as told by your doctor.  Warm up before doing exercise to prevent future muscle strains. GET HELP IF:   You have more pain or puffiness (swelling) in the injured area.  You feel numbness, tingling, or notice a loss of strength in the injured area. MAKE SURE YOU:   Understand these instructions.  Will watch your condition.  Will get help right away if you are not doing well or get worse. Document Released: 08/16/2008 Document Revised: 08/28/2013 Document Reviewed: 06/06/2013 [facility] Patient Information 2015 [place], [place]. This information is not intended to replace advice given to you by your health care provider. Make sure you discuss any questions you have with your health care provider.

## 2015-01-02 NOTE — Progress Notes (Signed)
Subjective:     Patient ID: Kaitlyn Hunt, female   DOB: [birth year], 24 y.o.   MRN: [id number]  HPI  Armpit pain:C/O pain of the muscle of her left armpit that started 3 days ago, she described it as a sharp pain with a knot associated with it, she denies any know trigger. Pain has since then improved from 10/10 in severity to 3/10 in severity today.  Current Outpatient Prescriptions on File Prior to Visit  Medication Sig Dispense Refill  . medroxyPROGESTERone (DEPO-PROVERA) 150 MG/ML injection Inject 150 mg into the muscle every 3 (three) months.    . medroxyPROGESTERone (PROVERA) 10 MG tablet Take 1 tablet (10 mg total) by mouth daily. 10 tablet 0  . QUEtiapine (SEROQUEL) 100 MG tablet Take 1 tablet (100 mg total) by mouth at bedtime. 30 tablet 2   No current facility-administered medications on file prior to visit.   Past Medical History  Diagnosis Date  . Bipolar 1 disorder     previously on Depakote D/C 5/11 bc pt stated it made her feel lazy   . Eclampsia in pregnancy       Review of Systems  Respiratory: Negative.   Cardiovascular: Negative.   Gastrointestinal: Negative.   Genitourinary: Negative.   All other systems reviewed and are negative.  Filed Vitals:   01/02/15 0924  BP: 115/69  Pulse: 66  Temp: 98.1 F (36.7 C)  TempSrc: Oral  Height: 5\' 3"  (1.6 m)  Weight: 103 lb (46.72 kg)       Objective:   Physical Exam  Constitutional: She appears well-developed. No distress.  Cardiovascular: Normal rate, regular rhythm, normal heart sounds and intact distal pulses.   No murmur heard. Pulmonary/Chest: Effort normal and breath sounds normal. No respiratory distress. She has no wheezes.  Abdominal: Soft. Bowel sounds are normal. She exhibits no distension and no mass. There is no tenderness.  Musculoskeletal:       Right upper arm: Normal.       Left upper arm: Normal.  No tenderness of latissimus dorsi or pectoris, no palpable LN in both left and right  armpit.  Nursing note and vitals reviewed.      Assessment:     Armpit muscle strain     Plan:     Check problem list.

## 2015-01-02 NOTE — Assessment & Plan Note (Signed)
Armpit muscle ( Latissimus dorsi) Pain improving gradually. Patient reassured this will continue to get better. Tylenol recommended prn pain. Avoid lifting heavy object for now. F/U soon if no improvement.

## 2015-04-19 ENCOUNTER — Telehealth: Payer: Self-pay | Admitting: Family Medicine

## 2015-04-19 NOTE — Telephone Encounter (Signed)
Family Medicine Emergency Line Telephone Note  Returned call to [phone] to Ms. Vanderlinden who has multiple complaints. She reports mild stomach cramping x2 months associated with off and on headaches with dizziness. She was seen for these issues in February by her PCP and has had no major changes since that time. She denies fever, weight loss, changes in vision or hearing, chest pain, palpitations, shortness of breath, abdominal pain, nausea, vomiting, changes in bowel habits, blood in stool, change in bladder habits, myalgias, arthralgias, and rash. She has no emergent issues.   I recommended that she call the clinic on Tuesday morning to schedule the next available appointment with her PCP to discuss these ongoing issues.    Jhada Risk B. Bonner Puna, MD, PGY-2 04/19/2015 4:46 PM

## 2015-04-21 ENCOUNTER — Encounter: Payer: Self-pay | Admitting: Family Medicine

## 2015-04-21 ENCOUNTER — Encounter: Payer: Self-pay | Admitting: *Deleted

## 2015-04-21 ENCOUNTER — Ambulatory Visit (INDEPENDENT_AMBULATORY_CARE_PROVIDER_SITE_OTHER): Payer: Medicaid Other | Admitting: Family Medicine

## 2015-04-21 VITALS — BP 137/96 | HR 122 | Temp 97.9°F | Wt 96.0 lb

## 2015-04-21 DIAGNOSIS — G44229 Chronic tension-type headache, not intractable: Secondary | ICD-10-CM | POA: Diagnosis present

## 2015-04-21 DIAGNOSIS — R51 Headache: Secondary | ICD-10-CM

## 2015-04-21 DIAGNOSIS — R519 Headache, unspecified: Secondary | ICD-10-CM | POA: Insufficient documentation

## 2015-04-21 LAB — CBC
HEMATOCRIT: 38.7 % (ref 36.0–46.0)
Hemoglobin: 13 g/dL (ref 12.0–15.0)
MCH: 30 pg (ref 26.0–34.0)
MCHC: 33.6 g/dL (ref 30.0–36.0)
MCV: 89.2 fL (ref 78.0–100.0)
MPV: 10 fL (ref 8.6–12.4)
Platelets: 297 10*3/uL (ref 150–400)
RBC: 4.34 MIL/uL (ref 3.87–5.11)
RDW: 13.4 % (ref 11.5–15.5)
WBC: 7.4 10*3/uL (ref 4.0–10.5)

## 2015-04-21 LAB — BASIC METABOLIC PANEL
BUN: 9 mg/dL (ref 6–23)
CALCIUM: 9.5 mg/dL (ref 8.4–10.5)
CO2: 29 mEq/L (ref 19–32)
CREATININE: 0.56 mg/dL (ref 0.50–1.10)
Chloride: 104 mEq/L (ref 96–112)
GLUCOSE: 87 mg/dL (ref 70–99)
Potassium: 4.4 mEq/L (ref 3.5–5.3)
Sodium: 139 mEq/L (ref 135–145)

## 2015-04-21 LAB — TSH: TSH: 0.008 u[IU]/mL — ABNORMAL LOW (ref 0.350–4.500)

## 2015-04-21 MED ORDER — KETOROLAC TROMETHAMINE 30 MG/ML IJ SOLN
30.0000 mg | Freq: Once | INTRAMUSCULAR | Status: AC
  Administered 2015-04-21: 30 mg via INTRAMUSCULAR

## 2015-04-21 MED ORDER — AMITRIPTYLINE HCL 25 MG PO TABS: 25.0000 mg | ORAL_TABLET | Freq: Every day | ORAL | Status: DC

## 2015-04-21 NOTE — Progress Notes (Signed)
   Subjective:   Kaitlyn Hunt is a 24 y.o. female with a history of bipolar disorder, headaches here for chronic headache.  Frequent headaches: - since April, has had constant headache from time she wakes up to going to sleep - worse with any movement - also feels lightheaded with headaches, occasional hot flashes - also occasionally had palpitations that self-resolve quickly - pain is throbbing in frontal areas bilaterally - no aura, photophobia, phonophobia, N/V - no changes in hair/skin, + alternating diarrhea and constipation - tried tylenol, motrin, and excedrin migraine and none made it any better - no fevers, chills, sick contacts, neck pain/stiffness - no extra stress in her life currently.  Review of Systems:  Per HPI. All other systems reviewed and are negative.   PMH, PSH, Medications, Allergies, and FmHx reviewed and updated in EMR.  Social History: non smoker  Objective:  BP 137/96 mmHg  Pulse 122  Temp(Src) 97.9 F (36.6 C) (Oral)  Wt 96 lb (43.545 kg)  LMP 04/13/2015  Gen:  24 y.o. female in NAD HEENT: NCAT, MMM, EOMI, PERRL, anicteric sclerae, TMs clear, OP clear CV: RRR, no MRG Resp: Non-labored, CTAB, no wheezes noted Ext: WWP, no edema Neuro: Alert and oriented, speech normal, CN 2-12 intact, strength and sensation intact, no meningismus     Assessment:     Kaitlyn Hunt is a 24 y.o. female here for chronic headache.    Plan:     See problem list for problem-specific plans.   Virginia Crews, MD PGY-1,  [facility] 04/21/2015  3:37 PM

## 2015-04-21 NOTE — Patient Instructions (Signed)
Nice to meet you today. I think that your headache is related to taking too many Tylenol and ibuprofen for the headache. This called a rebound headache.  I will give you a shot of a medicine to take with a headache today. We will start a medication called amitriptyline at bedtime. This is to help you sleep better and it keeps the headaches from coming as often. Please come back to see your PCP in 2 weeks to discuss how the medication is treating you.  Take care, Dr. B  Analgesic Rebound Headaches An analgesic rebound headache is a headache that returns after pain medicine (analgesic) that was taken to treat the initial headache wears off. People who suffer from tension, migraine, or cluster headaches are at risk for developing rebound headaches. Any type of primary headache can return as a rebound headache if you regularly take analgesics more than three times a week. If the cycle of rebound headaches continues, they become chronic daily headaches.  CAUSES Analgesics frequently associated with this problem include common over-the-counter medicines like aspirin, ibuprofen, acetaminophen, sinus relief medicines, and other medicines that contain caffeine. Narcotic pain medicines are also a common cause of rebound headaches.  SIGNS AND SYMPTOMS The symptoms of rebound headaches are the same as the symptoms of your initial headache. Symptoms of specific types of headaches include: Tension headache  Pressure around the head.  Dull, aching head pain.  Pain felt over the front and sides of the head.  Tenderness in the muscles of the head, neck and shoulders. Migraine Headache  Pulsing or throbbing pain on one or both sides of the head.  Severe pain that interferes with daily activities.  Pain that is worsened by physical activity.  Nausea, vomiting, or both.  Pain with exposure to bright light, loud noises, or strong smells.  General sensitivity to bright light, loud noises, or strong  smells.  Visual changes.  Numbness of one or both arms. Cluster Headaches  Severe pain that begins in or around one eye or temple.  Redness in the eye on the same side as the pain.  Droopy or swollen eyelid.  One-sided head pain.  Nausea.  Runny nose.  Sweaty, pale facial skin.  Restlessness. DIAGNOSIS  Analgesic rebound headaches are diagnosed by reviewing your medical history. This includes the nature of your initial headaches, as well as the type of pain medicines you have been using to treat your headaches and how often you take them. TREATMENT Discontinuing frequent use of the analgesic medicine will typically reduce the frequency of the rebound episodes. This may initially worsen your headaches but eventually the pain should become more manageable, less frequent, and less severe.  Seeing a headache specialists may helpful. He or she may be able to help you manage your headaches and to make sure there is not another cause of the headaches. Alternative methods of stress relief such as acupuncture, counseling, biofeedback, and massage may also be helpful. Talk with your health care provider about which alternative treatments might be good for you. HOME CARE INSTRUCTIONS Stopping the regular use of pain medicine can be difficult. Follow your health care provider's instructions carefully. Keep all of your appointments. Avoid triggers that are known to cause your primary headaches. SEEK MEDICAL CARE IF: You continue to experience headaches after following your health care provider's recommended treatments. SEEK IMMEDIATE MEDICAL CARE IF:  You develop new headache pain.  You develop headache pain that is different than what you have experienced in the past.  You develop numbness or tingling in your arms or legs.  You develop changes in your speech or vision. MAKE SURE YOU:  Understand these instructions.  Will watch your child's condition.  Will get help right away if  your child is not doing well or gets worse. Document Released: 01/28/2004 Document Revised: 03/24/2014 Document Reviewed: 05/23/2013 [facility] Patient Information 2015 [place], [place]. This information is not intended to replace advice given to you by your health care provider. Make sure you discuss any questions you have with your health care provider.

## 2015-04-21 NOTE — Progress Notes (Signed)
I was the preceptor on the day of this visit.   Breely Panik MD  

## 2015-04-21 NOTE — Assessment & Plan Note (Signed)
Likely rebound headache 2/2 frequent use of analgesics for headache BMP, CBC, and TSH today as patient has h/o goiter and anemia Toradol 30mg  IM in clinic today Start Elavil 25mg  qhs for ppx - will likely need to titrate up to 75mg  qhs Hold all OTC analgesics F/u with PCP in 2 weeks

## 2015-04-22 NOTE — Addendum Note (Signed)
Addended by: Virginia Crews on: 04/22/2015 09:48 AM   Modules accepted: Orders

## 2015-04-23 ENCOUNTER — Telehealth: Payer: Self-pay | Admitting: Family Medicine

## 2015-04-23 NOTE — Telephone Encounter (Signed)
I called patient and advise her that she is due for repeat PAP. She agreed to call front office to schedule.

## 2015-04-27 ENCOUNTER — Telehealth: Payer: Self-pay | Admitting: Family Medicine

## 2015-04-27 DIAGNOSIS — R7989 Other specified abnormal findings of blood chemistry: Secondary | ICD-10-CM

## 2015-04-27 HISTORY — DX: Other specified abnormal findings of blood chemistry: R79.89

## 2015-04-27 NOTE — Telephone Encounter (Signed)
Called patietn to inform about labs from last visit. CBC and BMET wnl.  TSH low at 0.008.  Patient will need to have T4 drawn.  She reports that she has an appointment for a pap smear on 6/14 and would like to have it drawn then.  Ordered as future order.  Will forward to PCP to investigate further and f/u T4.  Virginia Crews, MD, MPH PGY-1,  [facility] 04/27/2015 1:39 PM

## 2015-05-05 ENCOUNTER — Encounter: Payer: Self-pay | Admitting: Family Medicine

## 2015-05-05 ENCOUNTER — Ambulatory Visit (INDEPENDENT_AMBULATORY_CARE_PROVIDER_SITE_OTHER): Payer: Medicaid Other | Admitting: Family Medicine

## 2015-05-05 ENCOUNTER — Other Ambulatory Visit (HOSPITAL_COMMUNITY)
Admission: RE | Admit: 2015-05-05 | Discharge: 2015-05-05 | Disposition: A | Discharge status: Home / Self Care | Payer: Medicaid Other | Source: Ambulatory Visit | Attending: Family Medicine | Admitting: Family Medicine

## 2015-05-05 VITALS — BP 139/68 | HR 74 | Temp 97.8°F | Wt 96.5 lb

## 2015-05-05 DIAGNOSIS — IMO0002 Reserved for concepts with insufficient information to code with codable children: Secondary | ICD-10-CM

## 2015-05-05 DIAGNOSIS — R946 Abnormal results of thyroid function studies: Secondary | ICD-10-CM | POA: Diagnosis not present

## 2015-05-05 DIAGNOSIS — Z01411 Encounter for gynecological examination (general) (routine) with abnormal findings: Secondary | ICD-10-CM | POA: Insufficient documentation

## 2015-05-05 DIAGNOSIS — N898 Other specified noninflammatory disorders of vagina: Secondary | ICD-10-CM

## 2015-05-05 DIAGNOSIS — Z113 Encounter for screening for infections with a predominantly sexual mode of transmission: Secondary | ICD-10-CM | POA: Insufficient documentation

## 2015-05-05 DIAGNOSIS — Z01419 Encounter for gynecological examination (general) (routine) without abnormal findings: Secondary | ICD-10-CM

## 2015-05-05 DIAGNOSIS — F319 Bipolar disorder, unspecified: Secondary | ICD-10-CM

## 2015-05-05 DIAGNOSIS — R896 Abnormal cytological findings in specimens from other organs, systems and tissues: Secondary | ICD-10-CM | POA: Diagnosis present

## 2015-05-05 DIAGNOSIS — N760 Acute vaginitis: Secondary | ICD-10-CM

## 2015-05-05 DIAGNOSIS — R7989 Other specified abnormal findings of blood chemistry: Secondary | ICD-10-CM

## 2015-05-05 HISTORY — DX: Reserved for concepts with insufficient information to code with codable children: IMO0002

## 2015-05-05 LAB — POCT WET PREP (WET MOUNT)
Clue Cells Wet Prep Whiff POC: POSITIVE
WBC, Wet Prep HPF POC: >20

## 2015-05-05 LAB — T3, FREE: T3, Free: 5.8 pg/mL — ABNORMAL HIGH (ref 2.3–4.2)

## 2015-05-05 LAB — TSH: TSH: 0.008 u[IU]/mL — AB (ref 0.350–4.500)

## 2015-05-05 LAB — T4, FREE: FREE T4: 1.67 ng/dL (ref 0.80–1.80)

## 2015-05-05 MED ORDER — METRONIDAZOLE 500 MG PO TABS: 500.0000 mg | ORAL_TABLET | Freq: Two times a day (BID) | ORAL | Status: DC

## 2015-05-05 NOTE — Assessment & Plan Note (Signed)
Non-compliant with treatment and psych follow up. I advised she follow up at [facility] soon. She is currently asymptomatic with no suicidal or homicidal ideation. She agreed to walk in to [place] sometimes this week.

## 2015-05-05 NOTE — Assessment & Plan Note (Signed)
Wet prep done today showed BV. I e-scribed Flagyl. Patient notified.

## 2015-05-05 NOTE — Patient Instructions (Signed)
Thyroid-Stimulating Hormone A thyroid-stimulating hormone (TSH) test is a blood test that is done to measure the level of TSH, also known as thyrotropin, in your blood. Knowing the level of TSH in your blood can help your health care provider:  Diagnose a thyroid gland or pituitary gland disorder.  Manage your condition and treatment if you have hypothyroidism or hyperthyroidism. TSH is produced by the pituitary gland. The pituitary gland is a small organ located just below the brain, behind your eyes and nasal passages. It is part of a system that monitors and maintains thyroid hormone levels and thyroid gland function. Thyroid hormones affect many body parts and systems, including the system that affects how quickly your body burns fuel for energy. Your health care provider may recommend testing your TSH level if you have signs and symptoms that are often associated with abnormal thyroid hormone levels. The blood used for testing is obtained through a needle placed in a vein in your arm. RESULTS It is your responsibility to obtain your test results. Ask the lab or department performing the test when and how you will get your results. Contact your health care provider to discuss any questions you have about your results.  Your health care provider will go over the test results with you and discuss the importance and meaning of your results, as well as the need for additional tests if necessary. Range of Normal Values  Ranges for normal values may vary among different labs and hospitals. You should always check with your health care provider after having lab work or other tests done to discuss whether your values are considered within normal limits.  Adult: 0.3-5 microU/mL or 0.3-5 mU/L (SI units).  Newborn: 3-18 microU/mL or 3-18 mU/L.  Cord: 3-12 microU/mL or 3-12 mU/L. Meaning of Results Outside Normal Value Ranges A high level of TSH may mean:  Your thyroid gland is not making enough  thyroid hormones. When the thyroid gland does not make enough thyroid hormones, the pituitary gland releases TSH into the bloodstream. The higher-than-normal levels of TSH prompt the thyroid gland to release more thyroid hormones.  You are getting an insufficient level of thyroid hormone medicine, if you are receiving this type of treatment.  There is a problem with the pituitary gland (rare). A low level of TSH can indicate a problem with the pituitary gland. Document Released: 12/02/2004 Document Revised: 03/24/2014 Document Reviewed: 10/19/2008 ExitCare Patient Information 2015 [employer]. This information is not intended to replace advice given to you by your health care provider. Make sure you discuss any questions you have with your health care provider.  

## 2015-05-05 NOTE — Progress Notes (Signed)
Subjective:     Patient ID: Kaitlyn Hunt, female   DOB: [birth year], 24 y.o.   MRN: [id number]  HPI  Abnormal VZS:MOLM to follow up with her abnormal result after 1 yr as instructed. Vaginal discharge: VD  For about 2 months now, whitish in color, has some odor to it, denies any vaginal itching or dysuria. LMP: 04/13/15. She has been having suprapubic pain on and off as well. Abnormal thyroid test:+ Palpitation, had not gained weight a lot. She has heat intolerance. Bipolar: Has been off her medications, she did not follow up with her psychiatrist because she does not like the long wait at [facility].  Current Outpatient Prescriptions on File Prior to Visit  Medication Sig Dispense Refill  . amitriptyline (ELAVIL) 25 MG tablet Take 1 tablet (25 mg total) by mouth at bedtime. (Patient not taking: Reported on 05/05/2015) 30 tablet 2  . QUEtiapine (SEROQUEL) 100 MG tablet Take 1 tablet (100 mg total) by mouth at bedtime. (Patient not taking: Reported on 05/05/2015) 30 tablet 2   No current facility-administered medications on file prior to visit.   Past Medical History  Diagnosis Date  . Bipolar 1 disorder     previously on Depakote D/C 5/11 bc pt stated it made her feel lazy   . Eclampsia in pregnancy       Review of Systems  Respiratory: Negative.   Cardiovascular: Positive for palpitations.       Occasional palpitation.  Gastrointestinal: Negative.   Genitourinary: Positive for vaginal discharge. Negative for vaginal bleeding.  All other systems reviewed and are negative.  Filed Vitals:   05/05/15 1038  BP: 139/68  Pulse: 74  Temp: 97.8 F (36.6 C)  TempSrc: Oral  Weight: 96 lb 8 oz (43.772 kg)       Objective:   Physical Exam  Constitutional: She is oriented to person, place, and time. She appears well-developed. No distress.  Cardiovascular: Normal rate, regular rhythm and normal heart sounds.   No murmur heard. Pulmonary/Chest: Effort normal and breath sounds normal.  No respiratory distress. She has no wheezes.  Abdominal: Soft. Bowel sounds are normal. She exhibits no distension and no mass. There is no tenderness.  Neurological: She is alert and oriented to person, place, and time.  Nursing note and vitals reviewed.      Assessment:     ASCUS + HPV Vaginal discharge Low TSH Bipolar    Plan:     Check problem list.

## 2015-05-05 NOTE — Assessment & Plan Note (Signed)
Complete TFT checked today. I will contact patient with result and treat appropriately.

## 2015-05-05 NOTE — Assessment & Plan Note (Signed)
Here for 1 year f/u. PAP repeated today. I will contact her with result.

## 2015-05-06 ENCOUNTER — Encounter: Payer: Self-pay | Admitting: Family Medicine

## 2015-05-06 ENCOUNTER — Telehealth: Payer: Self-pay | Admitting: *Deleted

## 2015-05-06 LAB — CYTOLOGY - PAP

## 2015-05-06 NOTE — Telephone Encounter (Signed)
Pt is aware of results.  Appt made for 05-22-15. Jazmin [name],CMA

## 2015-05-06 NOTE — Telephone Encounter (Signed)
-----   Message from [name], MD sent at 05/06/2015  5:45 AM EDT ----- Please call patient to inform her that her thyroid test is again abnormal. Have her schedule follow up with me within 1-2 wks to discuss lab and management plan.

## 2015-05-12 ENCOUNTER — Telehealth: Payer: Self-pay | Admitting: *Deleted

## 2015-05-12 NOTE — Telephone Encounter (Signed)
-----   Message from [name], MD sent at 05/11/2015  8:35 AM EDT ----- Please call patient that her PAP is abnormal, and she had some progression in the abnormality from the last PAP she had 1 year ago. Please have her schedule colposcopy with [place] at [facility] clinic. If she has any further information have her see me soon at the clinic. Thank you.

## 2015-05-12 NOTE — Telephone Encounter (Signed)
Spoke with patient and she voiced understanding of results.  Appt made for next GYN clinic on 06-04-2015.  Patient has an appt to see Dr. Gwendlyn Deutscher on 05-22-2015 to follow up on her thyroid. Jazmin [name],CMA

## 2015-05-22 ENCOUNTER — Ambulatory Visit: Payer: Medicaid Other | Admitting: Family Medicine

## 2015-06-04 ENCOUNTER — Ambulatory Visit: Payer: Medicaid Other

## 2015-06-18 ENCOUNTER — Ambulatory Visit: Payer: Medicaid Other

## 2015-07-21 ENCOUNTER — Ambulatory Visit: Payer: Medicaid Other | Admitting: Family Medicine

## 2015-10-03 ENCOUNTER — Telehealth: Payer: Self-pay | Admitting: Family Medicine

## 2015-10-03 NOTE — Telephone Encounter (Signed)
Paged to [facility] Emergency Line at approx [phone], by patient Kaitlyn Hunt. 24 yr female, without significant pertinent history. She reports constipation for "about the past 2 weeks" without any bowel movement, however today she did have a BM, described as moderate size and soft, associated with some generalized abdominal pain / cramping, with notable improvement this morning after BM but still some symptoms. Denies any nausea, vomiting, fevers. She has not had constipation this bad before. Currently not taking any medicines on a regular basis. Has not tried any OTC stool softeners or laxatives.  She is seeking advice on constipation. I recommended that she should start by making sure that she improves oral hydration with water and stay active (avoid remaining sedentary) to help normalize bowel function, fiber diet, vegetables. For treatment, advised her to start with OTC Miralax 17g or 1 capful mixed into water daily starting today for next few days. Goal for 1-2 soft BMs daily, may take a few days to improve, if no regular BM in 2-3 days can increase dose to TWICE daily on Miralax. Given her reported presentation, I advised her that home treatment would be appropriate for her, and if not improving over weekend she could perhaps go to Urgent Care. Otherwise, she should call [facility] next week if not improving for a follow-up apt.  Kaitlyn Hunt, [place], PGY-3

## 2015-10-06 ENCOUNTER — Emergency Department (HOSPITAL_COMMUNITY): Payer: Medicaid Other

## 2015-10-06 ENCOUNTER — Emergency Department (HOSPITAL_COMMUNITY)
Admission: EM | Admit: 2015-10-06 | Discharge: 2015-10-06 | Disposition: A | Discharge status: Home / Self Care | Payer: Medicaid Other | Attending: Emergency Medicine | Admitting: Emergency Medicine

## 2015-10-06 ENCOUNTER — Encounter (HOSPITAL_COMMUNITY): Payer: Self-pay | Admitting: Emergency Medicine

## 2015-10-06 DIAGNOSIS — F17210 Nicotine dependence, cigarettes, uncomplicated: Secondary | ICD-10-CM | POA: Insufficient documentation

## 2015-10-06 DIAGNOSIS — Z3202 Encounter for pregnancy test, result negative: Secondary | ICD-10-CM | POA: Diagnosis not present

## 2015-10-06 DIAGNOSIS — N760 Acute vaginitis: Secondary | ICD-10-CM | POA: Diagnosis not present

## 2015-10-06 DIAGNOSIS — R102 Pelvic and perineal pain: Secondary | ICD-10-CM

## 2015-10-06 DIAGNOSIS — R1084 Generalized abdominal pain: Secondary | ICD-10-CM

## 2015-10-06 DIAGNOSIS — K5900 Constipation, unspecified: Secondary | ICD-10-CM | POA: Diagnosis not present

## 2015-10-06 DIAGNOSIS — N739 Female pelvic inflammatory disease, unspecified: Secondary | ICD-10-CM | POA: Diagnosis not present

## 2015-10-06 DIAGNOSIS — B9689 Other specified bacterial agents as the cause of diseases classified elsewhere: Secondary | ICD-10-CM

## 2015-10-06 DIAGNOSIS — N730 Acute parametritis and pelvic cellulitis: Secondary | ICD-10-CM

## 2015-10-06 DIAGNOSIS — Z8659 Personal history of other mental and behavioral disorders: Secondary | ICD-10-CM | POA: Insufficient documentation

## 2015-10-06 LAB — URINALYSIS, ROUTINE W REFLEX MICROSCOPIC
BILIRUBIN URINE: NEGATIVE
Glucose, UA: NEGATIVE mg/dL
Ketones, ur: NEGATIVE mg/dL
Leukocytes, UA: NEGATIVE
Nitrite: NEGATIVE
PROTEIN: NEGATIVE mg/dL
Specific Gravity, Urine: 1.027 (ref 1.005–1.030)
pH: 6.5 (ref 5.0–8.0)

## 2015-10-06 LAB — COMPREHENSIVE METABOLIC PANEL
ALK PHOS: 67 U/L (ref 38–126)
ALT: 9 U/L — AB (ref 14–54)
AST: 15 U/L (ref 15–41)
Albumin: 3.7 g/dL (ref 3.5–5.0)
Anion gap: 9 (ref 5–15)
BILIRUBIN TOTAL: 0.2 mg/dL — AB (ref 0.3–1.2)
BUN: 13 mg/dL (ref 6–20)
CALCIUM: 9 mg/dL (ref 8.9–10.3)
CHLORIDE: 101 mmol/L (ref 101–111)
CO2: 26 mmol/L (ref 22–32)
CREATININE: 0.84 mg/dL (ref 0.44–1.00)
Glucose, Bld: 102 mg/dL — ABNORMAL HIGH (ref 65–99)
Potassium: 3.7 mmol/L (ref 3.5–5.1)
Sodium: 136 mmol/L (ref 135–145)
Total Protein: 8.7 g/dL — ABNORMAL HIGH (ref 6.5–8.1)

## 2015-10-06 LAB — URINE MICROSCOPIC-ADD ON

## 2015-10-06 LAB — CBC WITH DIFFERENTIAL/PLATELET
Basophils Absolute: 0 10*3/uL (ref 0.0–0.1)
Basophils Relative: 0 %
EOS PCT: 1 %
Eosinophils Absolute: 0.2 10*3/uL (ref 0.0–0.7)
HEMATOCRIT: 39.5 % (ref 36.0–46.0)
Hemoglobin: 12.9 g/dL (ref 12.0–15.0)
Lymphocytes Relative: 17 %
Lymphs Abs: 2.3 10*3/uL (ref 0.7–4.0)
MCH: 30.1 pg (ref 26.0–34.0)
MCHC: 32.7 g/dL (ref 30.0–36.0)
MCV: 92.3 fL (ref 78.0–100.0)
Monocytes Absolute: 1.1 10*3/uL — ABNORMAL HIGH (ref 0.1–1.0)
Monocytes Relative: 8 %
NEUTROS ABS: 10 10*3/uL — AB (ref 1.7–7.7)
Neutrophils Relative %: 74 %
PLATELETS: 395 10*3/uL (ref 150–400)
RBC: 4.28 MIL/uL (ref 3.87–5.11)
RDW: 13.1 % (ref 11.5–15.5)
WBC: 13.6 10*3/uL — ABNORMAL HIGH (ref 4.0–10.5)

## 2015-10-06 LAB — WET PREP, GENITAL
SPERM: NONE SEEN
TRICH WET PREP: NONE SEEN
YEAST WET PREP: NONE SEEN

## 2015-10-06 LAB — I-STAT BETA HCG BLOOD, ED (MC, WL, AP ONLY): I-stat hCG, quantitative: <5 m[IU]/mL (ref <5)

## 2015-10-06 MED ORDER — SODIUM CHLORIDE 0.9 % IV BOLUS (SEPSIS)
1000.0000 mL | Freq: Once | INTRAVENOUS | Status: AC
  Administered 2015-10-06: 1000 mL via INTRAVENOUS

## 2015-10-06 MED ORDER — CEFTRIAXONE SODIUM 250 MG IJ SOLR
250.0000 mg | Freq: Once | INTRAMUSCULAR | Status: AC
  Administered 2015-10-06: 250 mg via INTRAMUSCULAR
  Filled 2015-10-06: qty 250

## 2015-10-06 MED ORDER — POLYETHYLENE GLYCOL 3350 17 G PO PACK
17.0000 g | PACK | Freq: Every day | ORAL | Status: DC
  Administered 2015-10-06: 17 g via ORAL
  Filled 2015-10-06: qty 1

## 2015-10-06 MED ORDER — DOCUSATE SODIUM 100 MG PO CAPS
100.0000 mg | ORAL_CAPSULE | Freq: Once | ORAL | Status: AC
  Administered 2015-10-06: 100 mg via ORAL
  Filled 2015-10-06: qty 1

## 2015-10-06 MED ORDER — METRONIDAZOLE 500 MG PO TABS: 500.0000 mg | ORAL_TABLET | Freq: Two times a day (BID) | ORAL | Status: DC

## 2015-10-06 MED ORDER — ONDANSETRON HCL 4 MG/2ML IJ SOLN
4.0000 mg | Freq: Once | INTRAMUSCULAR | Status: AC
  Administered 2015-10-06: 4 mg via INTRAVENOUS
  Filled 2015-10-06: qty 2

## 2015-10-06 MED ORDER — DOXYCYCLINE HYCLATE 100 MG PO CAPS: 100.0000 mg | ORAL_CAPSULE | Freq: Two times a day (BID) | ORAL | Status: DC

## 2015-10-06 MED ORDER — MORPHINE SULFATE (PF) 4 MG/ML IV SOLN
4.0000 mg | Freq: Once | INTRAVENOUS | Status: AC
  Administered 2015-10-06: 4 mg via INTRAVENOUS
  Filled 2015-10-06: qty 1

## 2015-10-06 MED ORDER — LIDOCAINE HCL (PF) 1 % IJ SOLN
INTRAMUSCULAR | Status: AC
  Administered 2015-10-06: 2.1 mL
  Filled 2015-10-06: qty 5

## 2015-10-06 MED ORDER — IOHEXOL 300 MG/ML  SOLN
100.0000 mL | Freq: Once | INTRAMUSCULAR | Status: AC | PRN
Start: 2015-10-06 — End: 2015-10-06
  Administered 2015-10-06: 100 mL via INTRAVENOUS

## 2015-10-06 NOTE — ED Provider Notes (Signed)
CSN: [id number]     Arrival date & time 10/06/15  1447 History   First MD Initiated Contact with Patient 10/06/15 1501     Chief Complaint  Patient presents with  . Abdominal Pain     (Consider location/radiation/quality/duration/timing/severity/associated sxs/prior Treatment) HPI   Kaitlyn Hunt is a 24 y.o. female, with a history of Bipolar disorder, presenting to the ED with constipation for the last month. Pt states she took a laxative that she can't remember the name of on Saturday, had a small BM on Sunday but has not had one since then. Pt also complains of generalized abdominal soreness that came on Sunday after she was straining to push during the BM. Pt rates her soreness at 10/10, non-radiating. Pt denies hematochezia, fever/chills, N/V/D, or any other pain or complaints. Pt states her last normal BM was the beginning of October. Pt denies new medications. Nothing has made the pain better, but straining to have a BM makes it worse.     Past Medical History  Diagnosis Date  . Bipolar 1 disorder ([place])     previously on Depakote D/C 5/11 bc pt stated it made her feel lazy   . Eclampsia in pregnancy    Past Surgical History  Procedure Laterality Date  . Cesarean section     Family History  Problem Relation Age of Onset  . Bipolar disorder Mother   . Bipolar disorder Maternal Aunt   . Bipolar disorder Sister    Social History  Substance Use Topics  . Smoking status: Current Every Day Smoker -- 0.50 packs/day    Types: Cigarettes  . Smokeless tobacco: None  . Alcohol Use: No   OB History    Gravida Para Term Preterm AB TAB SAB Ectopic Multiple Living   1 1  1            Review of Systems  Gastrointestinal: Positive for abdominal pain and constipation. Negative for nausea, vomiting, diarrhea and blood in stool.  Genitourinary: Negative for dysuria and flank pain.  All other systems reviewed and are negative.     Allergies  Review of patient's allergies  indicates no known allergies.  Home Medications   Prior to Admission medications   Medication Sig Start Date End Date Taking? Authorizing Provider  doxycycline (VIBRAMYCIN) 100 MG capsule Take 1 capsule (100 mg total) by mouth 2 (two) times daily. 10/06/15   Brettany Sydney C Navraj Dreibelbis, PA-C  metroNIDAZOLE (FLAGYL) 500 MG tablet Take 1 tablet (500 mg total) by mouth 2 (two) times daily. 10/06/15   Micky Sheller C Davonne Baby, PA-C   BP 117/64 mmHg  Pulse 76  Temp(Src) 98 F (36.7 C) (Oral)  Resp 18  SpO2 100%  LMP 09/29/2015 Physical Exam  Constitutional: She appears well-developed and well-nourished. No distress.  HENT:  Head: Normocephalic and atraumatic.  Eyes: Conjunctivae are normal. Pupils are equal, round, and reactive to light.  Cardiovascular: Normal rate, regular rhythm and normal heart sounds.   Pulmonary/Chest: Effort normal and breath sounds normal. No respiratory distress.  Abdominal: Soft. Normal appearance. She exhibits no distension and no mass. Bowel sounds are absent. There is generalized tenderness. There is no CVA tenderness.  Generalized abdominal firmness.  Genitourinary: Rectum normal and uterus normal. Rectal exam shows no external hemorrhoid, no internal hemorrhoid, no fissure, no mass, no tenderness and anal tone normal. Cervix exhibits motion tenderness and discharge. Cervix exhibits no friability. Right adnexum displays tenderness. Right adnexum displays no mass. Left adnexum displays tenderness. Left adnexum displays no  mass.  Med Tech served as chaperone for rectal and pelvic exams. Bilateral adnexal tenderness and cervical tenderness noted. Vaginal vault has moderate brown discharge, mixed with thick, milky-white discharge. No lesions noted. Otherwise normal female genitalia. Rectal exam revealed no gross bleeding and no stool in the rectal vault. No tenderness noted on exam.   Musculoskeletal: She exhibits no edema or tenderness.  Neurological: She is alert.  Skin: Skin is warm and dry.  She is not diaphoretic.  Nursing note and vitals reviewed.   ED Course  Pelvic exam Date/Time: 10/06/2015 4:39 PM Performed by: Lorayne Bender Authorized by: Arlean Hopping C Consent: Verbal consent obtained. Risks and benefits: risks, benefits and alternatives were discussed Consent given by: patient Patient understanding: patient states understanding of the procedure being performed Patient consent: the patient's understanding of the procedure matches consent given Procedure consent: procedure consent matches procedure scheduled Required items: required blood products, implants, devices, and special equipment available Patient identity confirmed: verbally with patient and arm band Time out: Immediately prior to procedure a "time out" was called to verify the correct patient, procedure, equipment, support staff and site/side marked as required. Local anesthesia used: no Patient sedated: no Patient tolerance: Patient tolerated the procedure well with no immediate complications   (including critical care time) Labs Review Labs Reviewed  WET PREP, GENITAL - Abnormal; Notable for the following:    Clue Cells Wet Prep HPF POC PRESENT (*)    WBC, Wet Prep HPF POC MANY (*)    All other components within normal limits  CBC WITH DIFFERENTIAL/PLATELET - Abnormal; Notable for the following:    WBC 13.6 (*)    Neutro Abs 10.0 (*)    Monocytes Absolute 1.1 (*)    All other components within normal limits  COMPREHENSIVE METABOLIC PANEL - Abnormal; Notable for the following:    Glucose, Bld 102 (*)    Total Protein 8.7 (*)    ALT 9 (*)    Total Bilirubin 0.2 (*)    All other components within normal limits  URINALYSIS, ROUTINE W REFLEX MICROSCOPIC (NOT AT [facility]) - Abnormal; Notable for the following:    APPearance CLOUDY (*)    Hgb urine dipstick TRACE (*)    All other components within normal limits  URINE MICROSCOPIC-ADD ON - Abnormal; Notable for the following:    Squamous Epithelial / LPF  0-5 (*)    Bacteria, UA RARE (*)    All other components within normal limits  RPR  HIV ANTIBODY (ROUTINE TESTING)  I-STAT BETA HCG BLOOD, ED ([facility], [facility], [facility] ONLY)  GC/CHLAMYDIA PROBE AMP ([place]) NOT AT [facility]    Imaging Review US Transvaginal Non-ob  10/06/2015  CLINICAL DATA:  Adnexal and cervical tenderness on exam with generalized abdominal pain 1 month. Negative pregnancy test. EXAM: TRANSABDOMINAL AND TRANSVAGINAL ULTRASOUND OF PELVIS TECHNIQUE: Both transabdominal and transvaginal ultrasound examinations of the pelvis were performed. Transabdominal technique was performed for global imaging of the pelvis including uterus, ovaries, adnexal regions, and pelvic cul-de-sac. It was necessary to proceed with endovaginal exam following the transabdominal exam to visualize the endometrium and ovaries. COMPARISON:  None FINDINGS: Uterus Measurements: 3.4 x 4.2 x 5.0 cm. 1.6 cm intramural fibroid over the anterior uterine body/fundus. Endometrium Thickness: 3.7 mm.  Tiny amount of fluid in the endometrial canal. Right ovary Measurements: 1.4 x 1.9 x 3.1 cm. Normal appearance/no adnexal mass. Normal color flow. There is an oval complex cystic and solid mass over the right adnexa which is  difficult to distinguish if separate from or rising from the right ovary and measures approximately 2.6 x 3.2 x 3.8 cm. Left ovary Measurements: 2.5 x 2.1 x 3.8 cm. Normal appearance/no adnexal mass. Normal color flow. Other findings Mild complex fluid lateral to the left ovary. IMPRESSION: Complex solid and cystic mass in the right adnexa difficult to distinguish its origin, but likely ovarian. This measures approximately 2.6 x 3.2 x 3.8 cm. This may represent a non neoplastic process such as a dermoid cyst and less likely endometrioma, hemorrhagic cysts, tubo-ovarian abscess or neoplasm. Complex free fluid left adnexa. Recommend CT for further evaluation. 1.6 cm anterior uterine fibroid. Electronically Signed   By: Marin Olp M.D.   On: 10/06/2015 19:02   US Pelvis Complete  10/06/2015  CLINICAL DATA:  Adnexal and cervical tenderness on exam with generalized abdominal pain 1 month. Negative pregnancy test. EXAM: TRANSABDOMINAL AND TRANSVAGINAL ULTRASOUND OF PELVIS TECHNIQUE: Both transabdominal and transvaginal ultrasound examinations of the pelvis were performed. Transabdominal technique was performed for global imaging of the pelvis including uterus, ovaries, adnexal regions, and pelvic cul-de-sac. It was necessary to proceed with endovaginal exam following the transabdominal exam to visualize the endometrium and ovaries. COMPARISON:  None FINDINGS: Uterus Measurements: 3.4 x 4.2 x 5.0 cm. 1.6 cm intramural fibroid over the anterior uterine body/fundus. Endometrium Thickness: 3.7 mm.  Tiny amount of fluid in the endometrial canal. Right ovary Measurements: 1.4 x 1.9 x 3.1 cm. Normal appearance/no adnexal mass. Normal color flow. There is an oval complex cystic and solid mass over the right adnexa which is difficult to distinguish if separate from or rising from the right ovary and measures approximately 2.6 x 3.2 x 3.8 cm. Left ovary Measurements: 2.5 x 2.1 x 3.8 cm. Normal appearance/no adnexal mass. Normal color flow. Other findings Mild complex fluid lateral to the left ovary. IMPRESSION: Complex solid and cystic mass in the right adnexa difficult to distinguish its origin, but likely ovarian. This measures approximately 2.6 x 3.2 x 3.8 cm. This may represent a non neoplastic process such as a dermoid cyst and less likely endometrioma, hemorrhagic cysts, tubo-ovarian abscess or neoplasm. Complex free fluid left adnexa. Recommend CT for further evaluation. 1.6 cm anterior uterine fibroid. Electronically Signed   By: Marin Olp M.D.   On: 10/06/2015 19:02   Ct Abdomen Pelvis W Contrast  10/06/2015  CLINICAL DATA:  Per EMS she was has generalized abdominal pain on and off for a month. Prior to Sunday she has not had a  BM for a month. On Sunday she had "a few pebbles." Denies n/v/d. Patient has taken laxatives with no relief. EXAM: CT ABDOMEN AND PELVIS WITH CONTRAST TECHNIQUE: Multidetector CT imaging of the abdomen and pelvis was performed using the standard protocol following bolus administration of intravenous contrast. CONTRAST:  114mL OMNIPAQUE IOHEXOL 300 MG/ML  SOLN COMPARISON:  Ultrasound from earlier the same day, and previous FINDINGS: Visualized lung bases clear. Unremarkable liver, nondilated gallbladder, spleen, adrenal glands, kidneys, pancreas, aorta, portal vein. Stomach is nondilated. Mild gaseous distention of small bowel loops with scattered fluid levels. The appendix is not identified. Mild distention of proximal colon, decompressed distally. Uterus unremarkable. There is a fluid-filled tubular structure in the vicinity of the right ovary with some eccentric wall enhancement. No discrete mass. Left ovary unremarkable. Urinary bladder incompletely distended. No ascites.  No free air.  No adenopathy. Lumbar spine and bony pelvis grossly intact. IMPRESSION: 1. Fluid filled tubular structure adjacent to the right ovary,  with some eccentric enhancement, possibly hydrosalpinx or PID. Correlate clinically. 2. Mildly distended small bowel and proximal colon, without evidence of obstruction. Electronically Signed   By: Lucrezia Europe M.D.   On: 10/06/2015 21:01   I have personally reviewed and evaluated these images and lab results as part of my medical decision-making.   EKG Interpretation None      MDM   Final diagnoses:  Constipation, unspecified constipation type  Generalized abdominal pain  Bacterial vaginosis  PID (acute pelvic inflammatory disease)    Kaitlyn Hunt presents with constipation and abdominal soreness.  Findings and plan of care discussed with Orlie Dakin, MD.  Pt originally denied vaginal discharge or bleeding, but told Dr. Winfred Leeds that she is bleeding and has had  discharge for 3 weeks. Pt last menstrual cycle was the beginning of November, so she states this current bleeding is abnormal for her. Pt also told Dr. Winfred Leeds that she has been having subjective fevers. Patient's pelvic exam was suspicious for possible PID. Wet prep reveals clue cells, indicating bacterial vaginosis.  7:14 PM Ultrasound results reveal a complex solid and cystic mass in the right adnexa likely ovarian. A CT was recommended for further evaluation. These results were communicated with the patient as well as the need for CT, which patient accepted. Patient was then complaining of further abdominal pain and pain management was ordered. 9:06 PM abdominal CT shows fluid-filled tubular structure adjacent to the right ovary is possibly hydrosalpinx or PID as well as mildly distended small bowel and proximal colon without evidence of obstruction. Will treat patient for PID. Patient was also given information about safer sex practices, to include using a condom with each sexual contact. Patient voiced understanding of this information and agreed to the plan of care.  Lorayne Bender, PA-C 10/06/15 2153  Orlie Dakin, MD 10/07/15 [phone]

## 2015-10-06 NOTE — Discharge Instructions (Signed)
You have been seen today for abdominal pain. Her imaging and labs give evidence for pelvic inflammatory disease. Follow up with an OB/GYN or PCP. Follow up with PCP as needed. Return to ED should symptoms worsen. Remember to practice safer sex practices, to include using a condom with sexual contact.   Emergency Department Resource Guide 1) Find a Doctor and Pay Out of Pocket Although you won't have to find out who is covered by your insurance plan, it is a good idea to ask around and get recommendations. You will then need to call the office and see if the doctor you have chosen will accept you as a new patient and what types of options they offer for patients who are self-pay. Some doctors offer discounts or will set up payment plans for their patients who do not have insurance, but you will need to ask so you aren't surprised when you get to your appointment.  2) Contact Your Local Health Department Not all health departments have doctors that can see patients for sick visits, but many do, so it is worth a call to see if yours does. If you don't know where your local health department is, you can check in your phone book. The CDC also has a tool to help you locate your state's health department, and many state websites also have listings of all of their local health departments.  3) Find a [facility] If your illness is not likely to be very severe or complicated, you may want to try a walk in clinic. These are popping up all over the country in pharmacies, drugstores, and shopping centers. They're usually staffed by nurse practitioners or physician assistants that have been trained to treat common illnesses and complaints. They're usually fairly quick and inexpensive. However, if you have serious medical issues or chronic medical problems, these are probably not your best option.  No Primary Care Doctor: - Call Health Connect at  [phone] - they can help you locate a primary care doctor that   accepts your insurance, provides certain services, etc. - Physician Referral Service- [phone]  Chronic Pain Problems: Organization         Address  Phone   Notes  [facility]  [phone] Patients need to be referred by their primary care doctor.   Medication Assistance: Organization         Address  Phone   Notes  [facility] Medication [facility] [address] [place], [place] [address] [phone] --Must be a resident of [facility] -- Must have NO insurance coverage whatsoever (no Medicaid/ Medicare, etc.) -- The pt. MUST have a primary care doctor that directs their care regularly and follows them in the community   [facility]  [phone]   [employer]  [phone]    Agencies that provide inexpensive medical care: Organization         Address  Phone   Notes  [place]  [phone]   [facility]    [phone]   [facility] [place], [place] [address] [phone]   [place] [address], [place] [phone]   Planned Parenthood    [phone]   [facility]    [phone]   [place] [address], [place] Phone:  [phone], Fax:  [phone] Hours of Operation:  9 am - 6 pm, M-F.  Also accepts Medicaid/Medicare and self-pay.  °[facility] ° [address], [place] Phone: ([phone]) [phone], Fax: [phone]. Hours of Operation:  8:30 am - 5:30 pm, M-F.  Also accepts Medicaid and self-pay.  °[facility] [place] [address], [place] Phone: ([phone]) [phone]   °[facility] [address], [place], [place] (336)723-1848, Ext. 123 Mondays & Thursdays: 7-9 AM.  First 15 patients are seen on a first come, first serve basis. °  ° °Medicaid-accepting [place] Providers: ° °Organization          Address  Phone   Notes  °[facility] [address], [place] ([phone]) [phone] Also accepts self-pay patients.  °[facility] [address], [place] ° ([phone]) [phone]   °[facility] [address], [place] (336) [phone]   °[facility] [address] [place] [address], [place] (336) [phone]   °Veita Bland [address], [place]  ° ([phone]) [phone] Only accepts [place] Access Medicaid patients after they have their name applied to their card.  ° °Self-Pay (no insurance) in [place]: ° °Organization         Address  Phone   Notes  °Sickle Cell Patients, [facility] [address], [place] (336) [phone]   °[facility] [address], [place] (336) [phone]   °[facility] ° [address], [place] (336) [phone]   °[facility]/Dr. Osei-Bonsu ° [address] [place] [address], [place] or [address], [place] ([phone]) [phone] Phone number for both [place] and [place] locations is the same.  °[facility] [address], [place] ([phone]) [phone]   °[facility] [address] [place] [address], [place] or [address] (336) 852-7530 °(336) [phone]   °[facility] [address], [place] (336) 350-1642, phone; ([phone]) [phone], fax Sees patients 1st and 3rd Saturday of every month.  Must not qualify for public or private insurance (i.e. Medicaid, Medicare, [place] Health Choice, Veterans' Benefits) • Household income should be no more than 200% of the poverty level •The clinic cannot treat you if you are pregnant or think you are pregnant • Sexually transmitted diseases are not treated at the clinic.  ° ° °Dental Care: °Organization         Address  Phone  Notes  °[place] [facility] [address],  [place] (336) [phone] Accepts children up to age 21 who are enrolled in Medicaid or [place] Health Choice; pregnant women with a Medicaid card; and children who have applied for Medicaid or [place] Health Choice, but were declined, whose parents can pay a reduced fee at time of service.  °[place] [facility] [place]  [address], [place] (336) 641-7733 Accepts children up to age 21 who are enrolled in Medicaid or [place] Health Choice; pregnant women with a Medicaid card; and children who have applied for Medicaid or  Health Choice, but were declined, whose parents can pay a reduced fee at time of service.  °[facility] ° [address], [place] (336) [phone] Patients are seen by appointment only. Walk-ins are not accepted. [facility] will see patients 18 years   of age and older. °Monday - Tuesday (8am-5pm) °Most Wednesdays (8:30-5pm) °$30 per visit, cash only  °[facility] ° [address], [place] ([phone]) [phone] Patients are seen by appointment only. Walk-ins are not accepted. [facility] will see patients 18 years of age and older. °One Wednesday Evening (Monthly: Volunteer Based).  $30 per visit, cash only  °[facility]  ([phone]) [phone] for adults; Children under age 4, call Graduate Pediatric Dentistry at ([phone]. Children aged 4-14, please call [phone]) [phone] to request a pediatric application. ° Dental services are provided in all areas of dental care including fillings, crowns and bridges, complete and partial dentures, implants, gum treatment, root canals, and extractions. Preventive care is also provided. Treatment is provided to both adults and children. °Patients are selected via a lottery and there is often a waiting list. °  °[facility] [address], °[place] ° (336) 763-8833 [email or web address] °  °[facility] [address], [place], [place]  (336)723-1848, Ext. 123 Second and Fourth Thursday of each month, opens at 6:30 AM; Clinic ends at 9 AM.  Patients are seen on a first-come first-served basis, and a limited number are seen during each clinic.  ° °[facility] ° [address], [place], [place] (336) [phone]   Eligibility Requirements °You must have lived in [place], [place], or [place] counties for at least the last three months. °  You cannot be eligible for state or federal sponsored healthcare insurance, including Veterans Administration, Medicaid, or Medicare. °  You generally cannot be eligible for healthcare insurance through your employer.  °  How to apply: °Eligibility screenings are held every Tuesday and Wednesday afternoon from 1:00 pm until 4:00 pm. You do not need an appointment for the interview!  °[facility] [address] [place] [address], [place], [place] 336-631-2330   °[facility]  336-342-8273   °[place]  336-703-3100   °[facility]  336-570-6415   ° °Behavioral Health Resources in the Community: °Intensive Outpatient Programs °Organization         Address  Phone  Notes  °[facility] [address], [place], [place] 336-878-6098   °[facility] [address], [place], [place] 336-832-9800   °[facility] [address], [place], [place] ° 336-882-2125   °[place] [address],  °[place], [place] 1-800-853-5163 or 336-641-4981   °Substance Abuse Resources °Organization         Address  Phone  Notes  °[facility]  336-882-2125   °[facility]  336-784-9470   °[facility]  336-285-9073   °[facility]  336-845-3988   °Residential & Outpatient Substance Abuse Program  1-800-659-3381   °Psychological Services °Organization         Address  Phone  Notes  °[facility]  336- 832-9600   °[facility]  336- 378-7881    °[facility] [address], [place] 1-800-853-5163 or 336-641-4981   ° °Mobile Crisis Teams °Organization         Address  Phone  Notes  °[facility], Mobile Crisis Care Unit  1-877-626-1772   °Assertive °[facility] ° [address] [place], [place] 336-834-9664   °Sharon DeEsch [address] °[place] 336-554-5454   ° °Self-Help/Support Groups °Organization           Address  Phone             Notes  [place]. [place] - variety of support groups  [place] Call for more information  Narcotics Anonymous (NA), [facility] [address], [employer] [place]  2 meetings at this location   Special educational needs teacher         Address  Phone  Notes  [facility] [place],    [place]  1-(469)477-4289   [facility]  [address] [place] [address], [place], [place]   [place], [place] [phone] Admissions: 8am-3pm M-F  [facility]cent[facility] Substance [place] [address],    [place], [place] [phone]   [facility] [address] [place], [place], [place]   [facility] [address]  [place], [place]   [facility] - Intensive Outpatient [address], [place], [place]   [facility].,  [place], [place] 1-(574)711-1611 or 934-106-0999   [facility] ([facility]) [address] [place], [place] Accepts Medicaid  [facility] [address]  [place] 1-432-012-6004 Substance Abuse/Addiction Treatment   [facility] Organization         Address  Phone  Notes  [facility]  803-823-1817   Domenic Schwab, PhD [address] [place], [place]   513-071-9308 or [phone]   [place], [place] [phone]   [facility] [address] [place], [place] [phone] Insurance/Medicaid/sponsorship through [facility] [address]                                    [place], [place] [phone] [place] [address], [place] [phone]    Dr. Adele Schilder  [phone]   [facility] Dept. 1) [address], [place] 2) [place] 3)  [address], [place] [phone]  [phone]   [place] [phone] or [phone] (After Hours)

## 2015-10-06 NOTE — ED Notes (Signed)
Bed: WA09 Expected date:  Expected time:  Means of arrival:  Comments: EMS- 24yo F, abdominal pain

## 2015-10-06 NOTE — ED Notes (Signed)
Patient drinking Miralax mixed with apple juice.

## 2015-10-06 NOTE — ED Provider Notes (Signed)
Complains of diffuse abdominal pain onset probably 3 weeks ago. She admits to feeling constipated. Pain is improved after she had small bowel movement. She also admits to irregular vaginal bleeding. Last normal menstrual period was 09/16/2015. Developed vaginal bleeding again today. She is presently hungry She also reports vaginal discharge since 09/16/2015. On exam patient is alert nontoxic abdomen nondistended nontender.   Orlie Dakin, MD 10/07/15 [phone]

## 2015-10-06 NOTE — ED Notes (Signed)
Patient transported to Ultrasound 

## 2015-10-06 NOTE — ED Notes (Signed)
Per EMS she was has generalized abdominal pain on and off for a month. Prior to Sunday she has not had a BM for a month. On Sunday she had "a few pebbles." Denies n/v/d. Patient has taken laxatives with no relief. Alert, oriented, and in no acute distress.

## 2015-10-07 ENCOUNTER — Telehealth: Payer: Self-pay | Admitting: Family Medicine

## 2015-10-07 ENCOUNTER — Other Ambulatory Visit: Payer: Self-pay | Admitting: Family Medicine

## 2015-10-07 LAB — HIV ANTIBODY (ROUTINE TESTING W REFLEX): HIV Screen 4th Generation wRfx: NONREACTIVE

## 2015-10-07 LAB — GC/CHLAMYDIA PROBE AMP (~~LOC~~) NOT AT ARMC
CHLAMYDIA, DNA PROBE: POSITIVE — AB
NEISSERIA GONORRHEA: POSITIVE — AB

## 2015-10-07 LAB — RPR: RPR: NONREACTIVE

## 2015-10-07 MED ORDER — NAPROXEN 500 MG PO TABS: 500.0000 mg | ORAL_TABLET | Freq: Two times a day (BID) | ORAL | Status: DC

## 2015-10-07 NOTE — Telephone Encounter (Signed)
Patient calling to ask for pain medication to get her to her appointment with Dr. Gwendlyn Deutscher on Friday: 10/09/15. Please advise pt at earliest convenience. Thank you, Fonda Kinder, ASA

## 2015-10-07 NOTE — Telephone Encounter (Signed)
Patient is aware. Jazmin [name],CMA  

## 2015-10-07 NOTE — Telephone Encounter (Signed)
Will forward to MD.  Patient is coming in on Friday for an ovarian cyst. Kaitlyn Hunt,CMA

## 2015-10-07 NOTE — Telephone Encounter (Signed)
I will send Naproxen to pharmacy.

## 2015-10-09 ENCOUNTER — Telehealth (HOSPITAL_BASED_OUTPATIENT_CLINIC_OR_DEPARTMENT_OTHER): Payer: Self-pay | Admitting: *Deleted

## 2015-10-09 ENCOUNTER — Ambulatory Visit: Payer: Medicaid Other | Admitting: Family Medicine

## 2015-10-09 ENCOUNTER — Ambulatory Visit: Payer: Medicaid Other | Admitting: Internal Medicine

## 2015-10-13 ENCOUNTER — Encounter: Payer: Self-pay | Admitting: Family Medicine

## 2015-10-13 ENCOUNTER — Ambulatory Visit (INDEPENDENT_AMBULATORY_CARE_PROVIDER_SITE_OTHER): Payer: Medicaid Other | Admitting: Family Medicine

## 2015-10-13 VITALS — BP 106/62 | HR 65 | Ht 63.0 in | Wt 97.2 lb

## 2015-10-13 DIAGNOSIS — K5900 Constipation, unspecified: Secondary | ICD-10-CM

## 2015-10-13 DIAGNOSIS — N739 Female pelvic inflammatory disease, unspecified: Secondary | ICD-10-CM | POA: Diagnosis present

## 2015-10-13 DIAGNOSIS — N7093 Salpingitis and oophoritis, unspecified: Secondary | ICD-10-CM | POA: Diagnosis not present

## 2015-10-13 NOTE — Progress Notes (Signed)
Date of Visit: 10/13/2015   HPI:  Patient presents to follow up on ED visit from 11/15. Seen in ED for abdominal pain and constipation, found to have purulent cervicitis with bilateral adnexal tenderness and cervical motion tenderness. Imaging revealed: - ultrasound pelvis - solid & cystic mass in R adnexa, likely ovarian etiology, also complex free fluid left adnexa. CT scan recommended to further evaluate - CT abdomen/pelvis - R fluid filled tubular structure, with concern for hydrosalpinx or PID  Patient treated empirically for PID with ceftriaxone IM in ED, with outpatient rx for doxycycline and flagyl.   She since reports she is feeling better. Has been taking doxycycline twice daily as prescribed but has not tolerated the flagyl. Eating and drinking well. Stomach pain much improved. No fevers. Having bowel movements 1-2 times per day without straining or blood in stool. Has been using apple juice to help with bowel movements. Vaginal discharge has improved. Sexual partner also being treated.   ROS: See HPI.  [place]: history of ASCUS with positive HPV, mood disorder, ADHD  PHYSICAL EXAM: BP 106/62 mmHg  Pulse 65  Ht 5\' 3"  (1.6 m)  Wt 97 lb 3.2 oz (44.09 kg)  BMI 17.22 kg/m2  LMP 09/29/2015 Gen: NAD, pleasant, cooperative HEENT: normocephalic, atraumatic, MMM Lungs: normal work of breathing  Abdomen: soft, nontender to palpation. No masses or organomegaly. Pelvic area minimally tender. Neuro: grossly nonfocal, speech normal Ext: atraumatic  ASSESSMENT/PLAN:  Female pelvic inflammatory disease Findings in ED diagnostic of PID (+gonorrhea and chlamydia with cervical motion tenderness). Ultrasound report, per my read, is concerning for possible tubo-ovarian abscess, which is typically treated parenterally initially. Fortunately, patient is clinically improving on outpatient therapy. I discussed the case with Dr. Harolyn Rutherford, who was on call for OB/GYN. She recommended that as patient is  clinically improving on oral therapy, no need for inpatient admission or IV antibiotics at this time. It is okay that patient not tolerating flagyl as long as she remains on doxycycline. Recommends repeating ultrasound of pelvis in 6 weeks to reassess cystic mass & possible hydrosalpinx. Will have patient follow up here in clinic in 4 weeks, at which time we can schedule her follow up ultrasound for ~6 weeks out from initial scans. Patient agreeable to this plan. Discussed return precautions.  Constipation Improving with apple juice.   FOLLOW UP: F/u in 1 month for PID/TOA  [place] J. Ardelia Mems, [place]

## 2015-10-13 NOTE — Patient Instructions (Signed)
Please continue the doxycycline Schedule a visit here in 1 month We will need to get another ultrasound around that time (6 weeks out from the original one) If any recurrence of fever, abdominal pain, vaginal discharge, etc please come here for appointment or go to the ER  Be well, Dr. Ardelia Mems   Pelvic Inflammatory Disease Pelvic inflammatory disease (PID) refers to an infection in some or all of the female organs. The infection can be in the uterus, ovaries, fallopian tubes, or the surrounding tissues in the pelvis. PID can cause abdominal or pelvic pain that comes on suddenly (acute pelvic pain). PID is a serious infection because it can lead to lasting (chronic) pelvic pain or the inability to have children (infertility). CAUSES This condition is most often caused by an infection that is spread during sexual contact. However, the infection can also be caused by the normal bacteria that are found in the vaginal tissues if these bacteria travel upward into the reproductive organs. PID can also occur following:  The birth of a baby.  A miscarriage.  An abortion.  Major pelvic surgery.  The use of an intrauterine device (IUD).  A sexual assault. RISK FACTORS This condition is more likely to develop in women who:  Are younger than 26 years of age.  Are sexually active at [facility] age.  Use nonbarrier contraception.  Have multiple sexual partners.  Have sex with someone who has symptoms of an STD (sexually transmitted disease).  Use oral contraception. At times, certain behaviors can also increase the possibility of getting PID, such as:  Using a vaginal douche.  Having an IUD in place. SYMPTOMS Symptoms of this condition include:  Abdominal or pelvic pain.  Fever.  Chills.  Abnormal vaginal discharge.  Abnormal uterine bleeding.  Unusual pain shortly after the end of a menstrual period.  Painful urination.  Pain with sexual intercourse.  Nausea and  vomiting. DIAGNOSIS To diagnose this condition, your health care provider will do a physical exam and take your medical history. A pelvic exam typically reveals great tenderness in the uterus and the surrounding pelvic tissues. You may also have tests, such as:  Lab tests, including a pregnancy test, blood tests, and urine test.  Culture tests of the vagina and cervix to check for an STD.  Ultrasound.  A laparoscopic procedure to look inside the pelvis.  Examining vaginal secretions under a microscope. TREATMENT Treatment for this condition may involve one or more approaches.  Antibiotic medicines may be prescribed to be taken by mouth.  Sexual partners may need to be treated if the infection is caused by an STD.  For more severe cases, hospitalization may be needed to give antibiotics directly into a vein through an IV tube.  Surgery may be needed if other treatments do not help, but this is rare. It may take weeks until you are completely well. If you are diagnosed with PID, you should also be checked for human immunodeficiency virus (HIV). Your health care provider may test you for infection again 3 months after treatment. You should not have unprotected sex. HOME CARE INSTRUCTIONS  Take over-the-counter and prescription medicines only as told by your health care provider.  If you were prescribed an antibiotic medicine, take it as told by your health care provider. Do not stop taking the antibiotic even if you start to feel better.  Do not have sexual intercourse until treatment is completed or as told by your health care provider. If PID is confirmed, your  recent sexual partners will need treatment, especially if you had unprotected sex.  Keep all follow-up visits as told by your health care provider. This is important. SEEK MEDICAL CARE IF:  You have increased or abnormal vaginal discharge.  Your pain does not improve.  You vomit.  You have a fever.  You cannot  tolerate your medicines.  Your partner has an STD.  You have pain when you urinate. SEEK IMMEDIATE MEDICAL CARE IF:  You have increased abdominal or pelvic pain.  You have chills.  Your symptoms are not better in 72 hours even with treatment.   This information is not intended to replace advice given to you by your health care provider. Make sure you discuss any questions you have with your health care provider.   Document Released: 11/07/2005 Document Revised: 07/29/2015 Document Reviewed: 12/15/2014 Elsevier Interactive Patient Education [employer].

## 2015-10-17 DIAGNOSIS — N739 Female pelvic inflammatory disease, unspecified: Secondary | ICD-10-CM

## 2015-10-17 DIAGNOSIS — A5909 Other urogenital trichomoniasis: Secondary | ICD-10-CM | POA: Insufficient documentation

## 2015-10-17 DIAGNOSIS — K5900 Constipation, unspecified: Secondary | ICD-10-CM | POA: Insufficient documentation

## 2015-10-17 HISTORY — DX: Female pelvic inflammatory disease, unspecified: N73.9

## 2015-10-17 NOTE — Assessment & Plan Note (Signed)
Improving with apple juice.

## 2015-10-17 NOTE — Assessment & Plan Note (Addendum)
Findings in ED diagnostic of PID (+gonorrhea and chlamydia with cervical motion tenderness). Ultrasound report, per my read, is concerning for possible tubo-ovarian abscess, which is typically treated parenterally initially. Fortunately, patient is clinically improving on outpatient therapy. I discussed the case with Dr. Harolyn Rutherford, who was on call for OB/GYN. She recommended that as patient is clinically improving on oral therapy, no need for inpatient admission or IV antibiotics at this time. It is okay that patient not tolerating flagyl as long as she remains on doxycycline. Recommends repeating ultrasound of pelvis in 6 weeks to reassess cystic mass & possible hydrosalpinx. Will have patient follow up here in clinic in 4 weeks, at which time we can schedule her follow up ultrasound for ~6 weeks out from initial scans. Patient agreeable to this plan. Discussed return precautions.

## 2015-12-02 ENCOUNTER — Ambulatory Visit: Payer: Medicaid Other

## 2016-01-19 ENCOUNTER — Ambulatory Visit (INDEPENDENT_AMBULATORY_CARE_PROVIDER_SITE_OTHER): Payer: Medicaid Other | Admitting: Family Medicine

## 2016-01-19 ENCOUNTER — Encounter: Payer: Self-pay | Admitting: Family Medicine

## 2016-01-19 VITALS — BP 118/73 | HR 67 | Temp 97.9°F | Ht 63.0 in | Wt 100.1 lb

## 2016-01-19 DIAGNOSIS — R946 Abnormal results of thyroid function studies: Secondary | ICD-10-CM | POA: Diagnosis not present

## 2016-01-19 DIAGNOSIS — R87612 Low grade squamous intraepithelial lesion on cytologic smear of cervix (LGSIL): Secondary | ICD-10-CM | POA: Diagnosis not present

## 2016-01-19 DIAGNOSIS — E079 Disorder of thyroid, unspecified: Secondary | ICD-10-CM | POA: Diagnosis not present

## 2016-01-19 DIAGNOSIS — Z309 Encounter for contraceptive management, unspecified: Secondary | ICD-10-CM | POA: Diagnosis not present

## 2016-01-19 DIAGNOSIS — R7989 Other specified abnormal findings of blood chemistry: Secondary | ICD-10-CM

## 2016-01-19 LAB — POCT URINE PREGNANCY: PREG TEST UR: NEGATIVE

## 2016-01-19 MED ORDER — NORGESTIM-ETH ESTRAD TRIPHASIC 0.18/0.215/0.25 MG-25 MCG PO TABS: 1.0000 | ORAL_TABLET | Freq: Every day | ORAL | Status: DC

## 2016-01-19 NOTE — Progress Notes (Signed)
Subjective:     Patient ID: Kaitlyn Hunt, female   DOB: 1990-12-29, 25 y.o.   MRN: [id number]  HPI  Birth control: She had been on Depo in the past but she is worried about recent recall of DEPO she heard of in the news. She is requesting switch to oral birth control. She missed the last few doses of her Depo shot she said. Thyroid disorder: She did not follow up on her test result. She denies any symptoms . Feels well otherwise. Abnormal PAP.: She stated she forgot to follow up with her abnormal PAP report. She denies any GU symptoms.  Current Outpatient Prescriptions on File Prior to Visit  Medication Sig Dispense Refill  . naproxen (NAPROSYN) 500 MG tablet Take 1 tablet (500 mg total) by mouth 2 (two) times daily with a meal. (Patient not taking: Reported on 01/19/2016) 30 tablet 0  . [DISCONTINUED] amitriptyline (ELAVIL) 25 MG tablet Take 1 tablet (25 mg total) by mouth at bedtime. (Patient not taking: Reported on 05/05/2015) 30 tablet 2  . [DISCONTINUED] QUEtiapine (SEROQUEL) 100 MG tablet Take 1 tablet (100 mg total) by mouth at bedtime. (Patient not taking: Reported on 05/05/2015) 30 tablet 2   No current facility-administered medications on file prior to visit.   Past Medical History  Diagnosis Date  . Bipolar 1 disorder ([place])     previously on Depakote D/C 5/11 bc pt stated it made her feel lazy   . Eclampsia in pregnancy       Review of Systems  Respiratory: Negative.   Cardiovascular: Negative.   Gastrointestinal: Negative.   Genitourinary: Negative.   All other systems reviewed and are negative.  Filed Vitals:   01/19/16 0928  BP: 118/73  Pulse: 67  Temp: 97.9 F (36.6 C)  TempSrc: Oral  Height: 5\' 3"  (1.6 m)  Weight: 100 lb 1.6 oz (45.405 kg)       Objective:   Physical Exam  Constitutional: She is oriented to person, place, and time. She appears well-developed.  Eyes: Pupils are equal, round, and reactive to light.  Neck: No thyromegaly present.   Cardiovascular: Normal rate, regular rhythm, normal heart sounds and intact distal pulses.   No murmur heard. Pulmonary/Chest: Effort normal and breath sounds normal. No respiratory distress. She has no wheezes.  Abdominal: Soft. Bowel sounds are normal. She exhibits no distension and no mass. There is no tenderness.  Neurological: She is alert and oriented to person, place, and time.  Nursing note and vitals reviewed.      Assessment:     Birth control: Thyroid disorder: Abnormal PAP.    Plan:     Check problem list.

## 2016-01-19 NOTE — Assessment & Plan Note (Deleted)
I again discuss her abnormal PAP result with her and need for colposcopy. She agreed to schedule colpo appointment today.

## 2016-01-19 NOTE — Assessment & Plan Note (Signed)
Poor compliance with follow-up. TFT rechecked today. If still low I will go ahead and refer her to an endocrinologist. She agreed with plan.

## 2016-01-19 NOTE — Patient Instructions (Signed)
It was nice seeing you today. I have send the prescription for your birth control to the pharmacy. Please pick it up and start as soon as possible. Use back up with condom for 5-7 days after starting. We will recheck your thyroid hormone today. Also remember to schedule appointment at the [facility] clinic for colposcopy as soon as possible for abnormal PAP.

## 2016-01-19 NOTE — Assessment & Plan Note (Signed)
Urine pregnancy test done today was negative. Orth tri cyclen lo was e-scribed to her pharmacy. May start medication immediately. I recommended back up protection with condom for 5-7 days after starting birth control. F/U as needed.

## 2016-01-21 ENCOUNTER — Encounter: Payer: Self-pay | Admitting: Family Medicine

## 2016-01-21 ENCOUNTER — Ambulatory Visit: Payer: Medicaid Other

## 2016-01-21 NOTE — Progress Notes (Signed)
Patient ID: Kaitlyn Hunt, female   DOB: August 15, 1991, 25 y.o.   MRN: [id number] Kaitlyn Hunt, I ordered thyroid test for this lady on Tuesday and yet I have no result. Did you take her to the lab for blood drawn?

## 2016-01-22 DIAGNOSIS — D069 Carcinoma in situ of cervix, unspecified: Secondary | ICD-10-CM

## 2016-01-22 HISTORY — DX: Carcinoma in situ of cervix, unspecified: D06.9

## 2016-01-22 NOTE — Progress Notes (Signed)
Corrected. Thanks

## 2016-01-22 NOTE — Assessment & Plan Note (Signed)
I again discuss her abnormal PAP result with her and need for colposcopy. She agreed to schedule colpo appointment today.

## 2016-02-04 ENCOUNTER — Ambulatory Visit (INDEPENDENT_AMBULATORY_CARE_PROVIDER_SITE_OTHER): Payer: Medicaid Other | Admitting: Family Medicine

## 2016-02-04 VITALS — BP 110/70 | HR 72 | Temp 98.6°F | Ht 63.0 in | Wt 97.0 lb

## 2016-02-04 DIAGNOSIS — N839 Noninflammatory disorder of ovary, fallopian tube and broad ligament, unspecified: Secondary | ICD-10-CM | POA: Diagnosis present

## 2016-02-04 DIAGNOSIS — R946 Abnormal results of thyroid function studies: Secondary | ICD-10-CM

## 2016-02-04 DIAGNOSIS — R87612 Low grade squamous intraepithelial lesion on cytologic smear of cervix (LGSIL): Secondary | ICD-10-CM

## 2016-02-04 DIAGNOSIS — N838 Other noninflammatory disorders of ovary, fallopian tube and broad ligament: Secondary | ICD-10-CM

## 2016-02-04 DIAGNOSIS — R7989 Other specified abnormal findings of blood chemistry: Secondary | ICD-10-CM

## 2016-02-04 HISTORY — DX: Other noninflammatory disorders of ovary, fallopian tube and broad ligament: N83.8

## 2016-02-04 LAB — BASIC METABOLIC PANEL WITH GFR
BUN: 11 mg/dL (ref 7–25)
CALCIUM: 9.5 mg/dL (ref 8.6–10.2)
CO2: 23 mmol/L (ref 20–31)
CREATININE: 0.81 mg/dL (ref 0.50–1.10)
Chloride: 103 mmol/L (ref 98–110)
GFR, Est Non African American: >89 mL/min (ref >=60)
Glucose, Bld: 74 mg/dL (ref 65–99)
Potassium: 4.1 mmol/L (ref 3.5–5.3)
SODIUM: 138 mmol/L (ref 135–146)

## 2016-02-04 LAB — TSH: TSH: 0.96 m[IU]/L

## 2016-02-04 NOTE — Patient Instructions (Signed)
We will call you and send you a letter wit the results of your cervical biopsy. I have set you up for a repeat CT scan of t he pelvis and I will send you a letter wit those results. Since we did a cervical biopsy today I would refrain from sex for 24 hours. You may have some mildspotting and cramping.

## 2016-02-05 NOTE — Progress Notes (Signed)
Patient ID: Kaitlyn Hunt, female   DOB: August 01, 1991, 25 y.o.   MRN: [id number] Patient given informed consent, signed copy in the chart.  Placed in lithotomy position. Cervix viewed with speculum and colposcope after application of acetic acid.   Colposcopy adequate (entire squamocolumnar junctions seen  in entirety) ?  Yes Acetowhite lesions?Yes Punctation?No Mosaicism?  No Abnormal vasculature?  No Biopsies?1 ECC?No Complications? No  COMMENTS: Patient was given post procedure instructions.  I will notify her of any pathology results.

## 2016-02-09 ENCOUNTER — Telehealth: Payer: Self-pay | Admitting: Family Medicine

## 2016-02-09 ENCOUNTER — Encounter: Payer: Self-pay | Admitting: Family Medicine

## 2016-02-09 NOTE — Telephone Encounter (Signed)
Dear Kaitlyn Hunt Team Please call her and tell her te cervical biopsy was NORMAL. I would have her repeat her pap in one year. I am sending her a letter as well with her other lab results THANKS! [name]

## 2016-02-09 NOTE — Telephone Encounter (Signed)
LM with pt mom for pt to call office at her convenience to inform her of below. Katharina Caper, Jazline Cumbee D, [place]

## 2016-02-09 NOTE — Telephone Encounter (Signed)
Pt informed. Zimmerman Rumple, Eran Windish D, CMA  

## 2016-02-11 ENCOUNTER — Encounter (HOSPITAL_COMMUNITY): Payer: Self-pay

## 2016-02-11 ENCOUNTER — Ambulatory Visit (HOSPITAL_COMMUNITY)
Admission: RE | Admit: 2016-02-11 | Discharge: 2016-02-11 | Disposition: A | Discharge status: Home / Self Care | Payer: Medicaid Other | Source: Ambulatory Visit | Attending: Family Medicine | Admitting: Family Medicine

## 2016-02-11 DIAGNOSIS — N838 Other noninflammatory disorders of ovary, fallopian tube and broad ligament: Secondary | ICD-10-CM

## 2016-02-11 DIAGNOSIS — N839 Noninflammatory disorder of ovary, fallopian tube and broad ligament, unspecified: Secondary | ICD-10-CM | POA: Insufficient documentation

## 2016-02-11 MED ORDER — IOHEXOL 300 MG/ML  SOLN
60.0000 mL | Freq: Once | INTRAMUSCULAR | Status: AC | PRN
  Administered 2016-02-11: 60 mL via INTRAVENOUS

## 2016-02-16 ENCOUNTER — Encounter: Payer: Self-pay | Admitting: Family Medicine

## 2016-04-22 ENCOUNTER — Ambulatory Visit (INDEPENDENT_AMBULATORY_CARE_PROVIDER_SITE_OTHER): Payer: Medicaid Other | Admitting: Family Medicine

## 2016-04-22 ENCOUNTER — Encounter: Payer: Self-pay | Admitting: Family Medicine

## 2016-04-22 VITALS — BP 118/86 | HR 94 | Temp 97.9°F | Ht 63.0 in | Wt 97.0 lb

## 2016-04-22 DIAGNOSIS — M25562 Pain in left knee: Secondary | ICD-10-CM | POA: Diagnosis not present

## 2016-04-22 DIAGNOSIS — M25469 Effusion, unspecified knee: Secondary | ICD-10-CM | POA: Diagnosis not present

## 2016-04-22 DIAGNOSIS — M25561 Pain in right knee: Secondary | ICD-10-CM | POA: Diagnosis not present

## 2016-04-22 DIAGNOSIS — R011 Cardiac murmur, unspecified: Secondary | ICD-10-CM

## 2016-04-22 MED ORDER — NAPROXEN 500 MG PO TABS: 500.0000 mg | ORAL_TABLET | Freq: Two times a day (BID) | ORAL | Status: DC

## 2016-04-22 NOTE — Patient Instructions (Signed)
It was nice seeing you today. I am sorry about your knee pain and bump. This could be due to a cyst or bone spur. I have ordered an xray to further evaluate. [facility] done. I also sent Naproxen to your pharmacy for pain. See me back soon in 4 wks. Return soon if symptoms worsens.

## 2016-04-22 NOTE — Assessment & Plan Note (Signed)
Still present but mostly asymptomatic. ECHO from 2014 reviewed and result discussed with her. Presence of trivial mitral valve regurgitation. Plan to monitor for now. I discussed cardiologist referral with her if symptomatic. She agreed with plan and verbalized understanding.

## 2016-04-22 NOTE — Progress Notes (Signed)
Subjective:     Patient ID: Kaitlyn Hunt, female   DOB: February 26, 1991, 25 y.o.   MRN: [id number]  HPI Knee pain and bump: Patient presented with few months hx of B/L knee pain with a knot on the back of her knee which started initially on her left knee like a small knot but has progressively increased in size. She has started to notice same bump on her right knee. This is associated with pain about 8/10 in severity aggravated by prolonged standing and improved by rest. She denies any recent or remote injury. She also endorsed a small bump on her right foot which is also painful. Murmur: Here for follow up. She denies chest pain, no SOB, has palpitation on rare occasions, feels well today.  Current Outpatient Prescriptions on File Prior to Visit  Medication Sig Dispense Refill  . [DISCONTINUED] amitriptyline (ELAVIL) 25 MG tablet Take 1 tablet (25 mg total) by mouth at bedtime. (Patient not taking: Reported on 05/05/2015) 30 tablet 2  . [DISCONTINUED] QUEtiapine (SEROQUEL) 100 MG tablet Take 1 tablet (100 mg total) by mouth at bedtime. (Patient not taking: Reported on 05/05/2015) 30 tablet 2   No current facility-administered medications on file prior to visit.   Past Medical History  Diagnosis Date  . Bipolar 1 disorder ([place])     previously on Depakote D/C 5/11 bc pt stated it made her feel lazy   . Eclampsia in pregnancy   . 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   Cardiovascular: Negative.  Negative for chest pain.  Gastrointestinal: Negative.   Musculoskeletal: Positive for joint swelling and arthralgias.  All other systems reviewed and are negative.      Filed Vitals:   04/22/16 0941  BP: 118/86  Pulse: 94  Temp: 97.9 F (36.6 C)  TempSrc: Oral  Height: 5\' 3"  (1.6 m)  Weight: 97 lb (43.999 kg)  SpO2: 100%    Objective:   Physical Exam  Constitutional: She appears well-developed. No distress.  Cardiovascular: Normal  rate and regular rhythm.   Murmur heard.  Systolic murmur is present with a grade of 2/6  Pulmonary/Chest: Effort normal and breath sounds normal. No respiratory distress. She has no wheezes.  Abdominal: Soft. Bowel sounds are normal. She exhibits no distension and no mass. There is no tenderness.  Musculoskeletal: Normal range of motion. She exhibits no edema.       Right knee: She exhibits normal range of motion and no swelling. Tenderness found.       Left knee: She exhibits normal range of motion. Tenderness found.       Legs: Nursing note and vitals reviewed.      Assessment:     Knee pain Knee swelling Heart murmur     Plan:     Check problem list.

## 2016-04-22 NOTE — Assessment & Plan Note (Signed)
++   bump or cyst of her left knee posteriorly. ?? Baker's cyst vs bone spur triggering knee pain. Pain worse with prolonged standing. I recommended intermittent resting of her knee. Xray ordered to assess the swelling. Naproxen prescribed prn pain. Return precaution discussed. Work letter also given.

## 2016-05-31 ENCOUNTER — Ambulatory Visit: Payer: Medicaid Other

## 2016-07-04 ENCOUNTER — Ambulatory Visit (HOSPITAL_COMMUNITY)
Admission: EM | Admit: 2016-07-04 | Discharge: 2016-07-04 | Discharge status: Against Medical Advice | Payer: Medicaid Other

## 2016-07-04 ENCOUNTER — Telehealth: Payer: Self-pay | Admitting: Family Medicine

## 2016-07-04 NOTE — Telephone Encounter (Signed)
**  After Hours/ Emergency Line Call*  Received a call to report that Kaitlyn Hunt is having a sore throat. She made appt for her sore throat on Thursday but wants to be seen now because her throat is really bothering her. Denies dysphagia or fever. Wants to know if she can go to urgent care tonight. Informed patient she can go to urgent care if she desires to be seen tonight. Will forward to PCP.  Carlyle Dolly, MD PGY-2, [facility]    .

## 2016-07-07 ENCOUNTER — Encounter: Payer: Self-pay | Admitting: Family Medicine

## 2016-07-07 ENCOUNTER — Ambulatory Visit (INDEPENDENT_AMBULATORY_CARE_PROVIDER_SITE_OTHER): Payer: Medicaid Other | Admitting: Family Medicine

## 2016-07-07 VITALS — BP 117/64 | HR 114 | Temp 98.8°F | Wt 98.0 lb

## 2016-07-07 DIAGNOSIS — R7989 Other specified abnormal findings of blood chemistry: Secondary | ICD-10-CM

## 2016-07-07 DIAGNOSIS — M25511 Pain in right shoulder: Secondary | ICD-10-CM

## 2016-07-07 DIAGNOSIS — N838 Other noninflammatory disorders of ovary, fallopian tube and broad ligament: Secondary | ICD-10-CM

## 2016-07-07 DIAGNOSIS — N839 Noninflammatory disorder of ovary, fallopian tube and broad ligament, unspecified: Secondary | ICD-10-CM

## 2016-07-07 DIAGNOSIS — R946 Abnormal results of thyroid function studies: Secondary | ICD-10-CM | POA: Diagnosis present

## 2016-07-07 LAB — T4, FREE: FREE T4: 1.1 ng/dL (ref 0.8–1.8)

## 2016-07-07 LAB — T3, FREE: T3 FREE: 3 pg/mL (ref 2.3–4.2)

## 2016-07-07 LAB — TSH: TSH: 0.77 mIU/L

## 2016-07-07 MED ORDER — NAPROXEN 500 MG PO TABS: 500.0000 mg | ORAL_TABLET | Freq: Two times a day (BID) | ORAL | 0 refills | Status: DC

## 2016-07-07 NOTE — Patient Instructions (Addendum)
Smoking Cessation, Tips for Success If you are ready to quit smoking, congratulations! You have chosen to help yourself be healthier. Cigarettes bring nicotine, tar, carbon monoxide, and other irritants into your body. Your lungs, heart, and blood vessels will be able to work better without these poisons. There are many different ways to quit smoking. Nicotine gum, nicotine patches, a nicotine inhaler, or nicotine nasal spray can help with physical craving. Hypnosis, support groups, and medicines help break the habit of smoking. WHAT THINGS CAN I DO TO MAKE QUITTING EASIER?  Here are some tips to help you quit for good:  Pick a date when you will quit smoking completely. Tell all of your friends and family about your plan to quit on that date.  Do not try to slowly cut down on the number of cigarettes you are smoking. Pick a quit date and quit smoking completely starting on that day.  Throw away all cigarettes.   Clean and remove all ashtrays from your home, work, and car.  On a card, write down your reasons for quitting. Carry the card with you and read it when you get the urge to smoke.  Cleanse your body of nicotine. Drink enough water and fluids to keep your urine clear or pale yellow. Do this after quitting to flush the nicotine from your body.  Learn to predict your moods. Do not let a bad situation be your excuse to have a cigarette. Some situations in your life might tempt you into wanting a cigarette.  Never have "just one" cigarette. It leads to wanting another and another. Remind yourself of your decision to quit.  Change habits associated with smoking. If you smoked while driving or when feeling stressed, try other activities to replace smoking. Stand up when drinking your coffee. Brush your teeth after eating. Sit in a different chair when you read the paper. Avoid alcohol while trying to quit, and try to drink fewer caffeinated beverages. Alcohol and caffeine may urge you to  smoke.  Avoid foods and drinks that can trigger a desire to smoke, such as sugary or spicy foods and alcohol.  Ask people who smoke not to smoke around you.  Have something planned to do right after eating or having a cup of coffee. For example, plan to take a walk or exercise.  Try a relaxation exercise to calm you down and decrease your stress. Remember, you may be tense and nervous for the first 2 weeks after you quit, but this will pass.  Find new activities to keep your hands busy. Play with a pen, coin, or rubber band. Doodle or draw things on paper.  Brush your teeth right after eating. This will help cut down on the craving for the taste of tobacco after meals. You can also try mouthwash.   Use oral substitutes in place of cigarettes. Try using lemon drops, carrots, cinnamon sticks, or chewing gum. Keep them handy so they are available when you have the urge to smoke.  When you have the urge to smoke, try deep breathing.  Designate your home as a nonsmoking area.  If you are a heavy smoker, ask your health care provider about a prescription for nicotine chewing gum. It can ease your withdrawal from nicotine.  Reward yourself. Set aside the cigarette money you save and buy yourself something nice.  Look for support from others. Join a support group or smoking cessation program. Ask someone at home or at work to help you with your plan   to quit smoking.  Always ask yourself, "Do I need this cigarette or is this just a reflex?" Tell yourself, "Today, I choose not to smoke," or "I do not want to smoke." You are reminding yourself of your decision to quit.  Do not replace cigarette smoking with electronic cigarettes (commonly called e-cigarettes). The safety of e-cigarettes is unknown, and some may contain harmful chemicals.  If you relapse, do not give up! Plan ahead and think about what you will do the next time you get the urge to smoke. HOW WILL I FEEL WHEN I QUIT SMOKING? You  may have symptoms of withdrawal because your body is used to nicotine (the addictive substance in cigarettes). You may crave cigarettes, be irritable, feel very hungry, cough often, get headaches, or have difficulty concentrating. The withdrawal symptoms are only temporary. They are strongest when you first quit but will go away within 10-14 days. When withdrawal symptoms occur, stay in control. Think about your reasons for quitting. Remind yourself that these are signs that your body is healing and getting used to being without cigarettes. Remember that withdrawal symptoms are easier to treat than the major diseases that smoking can cause.  Even after the withdrawal is over, expect periodic urges to smoke. However, these cravings are generally short lived and will go away whether you smoke or not. Do not smoke! WHAT RESOURCES ARE AVAILABLE TO HELP ME QUIT SMOKING? Your health care provider can direct you to community resources or hospitals for support, which may include:  Group support.  Education.  Hypnosis.  Therapy.   This information is not intended to replace advice given to you by your health care provider. Make sure you discuss any questions you have with your health care provider.   Document Released: 08/05/2004 Document Revised: 11/28/2014 Document Reviewed: 04/25/2013 Elsevier Interactive Patient Education 2016 Elsevier Inc.  Shoulder Range of Motion Exercises Shoulder range of motion (ROM) exercises are designed to keep the shoulder moving freely. They are often recommended for people who have shoulder pain. MOVEMENT EXERCISE When you are able, do this exercise 5-6 days per week, or as told by your health care provider. Work toward doing 2 sets of 10 swings. Pendulum Exercise How To Do This Exercise Lying Down 1. Lie face-down on a bed with your abdomen close to the side of the bed. 2. Let your arm hang over the side of the bed. 3. Relax your shoulder, arm, and hand. 4. Slowly  and gently swing your arm forward and back. Do not use your neck muscles to swing your arm. They should be relaxed. If you are struggling to swing your arm, have someone gently swing it for you. When you do this exercise for the first time, swing your arm at a 15 degree angle for 15 seconds, or swing your arm 10 times. As pain lessens over time, increase the angle of the swing to 30-45 degrees. 5. Repeat steps 1-4 with the other arm. How To Do This Exercise While Standing 1. Stand next to a sturdy chair or table and hold on to it with your hand.  Bend forward at the waist.  Bend your knees slightly.  Relax your other arm and let it hang limp.  Relax the shoulder blade of the arm that is hanging and let it drop.  While keeping your shoulder relaxed, use body motion to swing your arm in small circles. The first time you do this exercise, swing your arm for about 30 seconds or 10   times. When you do it next time, swing your arm for a little longer.  Stand up tall and relax.  Repeat steps 1-7, this time changing the direction of the circles. 2. Repeat steps 1-8 with the other arm. STRETCHING EXERCISES Do these exercises 3-4 times per day on 5-6 days per week or as told by your health care provider. Work toward holding the stretch for 20 seconds. Stretching Exercise 1 1. Lift your arm straight out in front of you. 2. Bend your arm 90 degrees at the elbow (right angle) so your forearm goes across your body and looks like the letter "L." 3. Use your other arm to gently pull the elbow forward and across your body. 4. Repeat steps 1-3 with the other arm. Stretching Exercise 2 You will need a towel or rope for this exercise. 1. Bend one arm behind your back with the palm facing outward. 2. Hold a towel with your other hand. 3. Reach the arm that holds the towel above your head, and bend that arm at the elbow. Your wrist should be behind your neck. 4. Use your free hand to grab the free end of the  towel. 5. With the higher hand, gently pull the towel up behind you. 6. With the lower hand, pull the towel down behind you. 7. Repeat steps 1-6 with the other arm. STRENGTHENING EXERCISES Do each of these exercises at four different times of day (sessions) every day or as told by your health care provider. To begin with, repeat each exercise 5 times (repetitions). Work toward doing 3 sets of 12 repetitions or as told by your health care provider. Strengthening Exercise 1 You will need a light weight for this activity. As you grow stronger, you may use a heavier weight. 1. Standing with a weight in your hand, lift your arm straight out to the side until it is at the same height as your shoulder. 2. Bend your arm at 90 degrees so that your fingers are pointing to the ceiling. 3. Slowly raise your hand until your arm is straight up in the air. 4. Repeat steps 1-3 with the other arm. Strengthening Exercise 2 You will need a light weight for this activity. As you grow stronger, you may use a heavier weight. 1. Standing with a weight in your hand, gradually move your straight arm in an arc, starting at your side, then out in front of you, then straight up over your head. 2. Gradually move your other arm in an arc, starting at your side, then out in front of you, then straight up over your head. 3. Repeat steps 1-2 with the other arm. Strengthening Exercise 3 You will need an elastic band for this activity. As you grow stronger, gradually increase the size of the bands or increase the number of bands that you use at one time. 1. While standing, hold an elastic band in one hand and raise that arm up in the air. 2. With your other hand, pull down the band until that hand is by your side. 3. Repeat steps 1-2 with the other arm.   This information is not intended to replace advice given to you by your health care provider. Make sure you discuss any questions you have with your health care provider.    Document Released: 08/06/2003 Document Revised: 03/24/2015 Document Reviewed: 11/03/2014 Elsevier Interactive Patient Education 2016 Elsevier Inc.  

## 2016-07-07 NOTE — Assessment & Plan Note (Signed)
Repeat us

## 2016-07-07 NOTE — Progress Notes (Signed)
   Subjective:    Patient ID: Kaitlyn Hunt is a 25 y.o. female presenting with No chief complaint on file.  on 07/07/2016  HPI: Feels tired and noticed neck swelling. Feels like her voice is getting deep. Feels like food might get stuck in her throat. She thinks she can see her neck more that previously. She has a h/o abnormal thyroid testing and abnormal pelvic sonogram. Reports jaw pain and right arm pain. Denies CP, SOB or N/V. Denies fever. Has h/o abnormal u/s with solid appearing ovarian lesion. CT performed which may have shown pyo/hydroslpinx--repeat normal, but no repeat pelvic sono.  Review of Systems  Constitutional: Negative for chills and fever.  Respiratory: Negative for shortness of breath.   Cardiovascular: Negative for chest pain.  Gastrointestinal: Negative for abdominal pain, nausea and vomiting.  Genitourinary: Negative for dysuria.  Skin: Negative for rash.      Objective:    BP 117/64   Pulse (!) 114   Temp 98.8 F (37.1 C) (Oral)   Wt 98 lb (44.5 kg)   LMP 06/13/2016   SpO2 98%   BMI 17.36 kg/m  Physical Exam  Constitutional: She is oriented to person, place, and time. She appears well-developed and well-nourished. No distress.  HENT:  Head: Normocephalic and atraumatic.  Eyes: No scleral icterus.  Neck: Neck supple.  Cardiovascular: Normal rate.   Pulmonary/Chest: Effort normal.  Abdominal: Soft.  Neurological: She is alert and oriented to person, place, and time.  Skin: Skin is warm and dry.  Psychiatric: She has a normal mood and affect.        Assessment & Plan:   Problem List Items Addressed This Visit      Unprioritized   Low TSH level - Primary    Patient has had a normal TSH most recently and thyroid is normal on exam--repeat studies and consider imaging if abnormal.      Relevant Orders   T4, free   T3, free   TSH   Ovarian mass, right    Repeat u/s      Relevant Orders   US Pelvis Complete   US Transvaginal Non-OB      Other Visit Diagnoses    Pain in joint of right shoulder       Trial of Naproxen. Shoulder exercises given.   Relevant Medications   naproxen (NAPROSYN) 500 MG tablet      Total face-to-face time with patient: 25 minutes. Over 50% of encounter was spent on counseling and coordination of care.   Edgerrin Correia S 07/07/2016 4:22 PM

## 2016-07-07 NOTE — Assessment & Plan Note (Signed)
Patient has had a normal TSH most recently and thyroid is normal on exam--repeat studies and consider imaging if abnormal.

## 2016-07-08 ENCOUNTER — Telehealth: Payer: Self-pay | Admitting: *Deleted

## 2016-07-08 ENCOUNTER — Encounter: Payer: Self-pay | Admitting: *Deleted

## 2016-07-08 ENCOUNTER — Telehealth: Payer: Self-pay | Admitting: Internal Medicine

## 2016-07-08 NOTE — Telephone Encounter (Signed)
After Hours/ Emergency Line Call  Received a call from Kaitlyn Hunt regarding persistent neck pain. She was just seen in our clinic on 8/17 with neck swelling and feeling like food might be getting stuck in her throat. Per note from that visit, thyroid was normal on exam and repeat thyroid studies were performed. Pt was prescribed Naproxen, but states she hasn't been taking it. I advised Pt to take the Naproxen. She can also alternate this with Tylenol. Pt requested an appointment to be seen on Monday. I have scheduled her for a same day appointment with Dr. Cyndia Skeeters for 8/21. Red flags discussed.  Will forward to PCP.  Evette Doffing, MD PGY-2, [facility]

## 2016-07-08 NOTE — Telephone Encounter (Signed)
-----   Message from Donnamae Jude, MD sent at 07/08/2016  8:22 AM EDT ----- Her thyroid testing is normal.

## 2016-07-08 NOTE — Telephone Encounter (Signed)
Tried calling patient to inform her of results (letter mailed) and of ultrasound appt at [facility] hospital on 8/24 at [place].  She needs to arrive 15 mins early and have a full bladder.  Will continue to try and reach patient. Jazmin [name],CMA

## 2016-07-11 ENCOUNTER — Ambulatory Visit: Payer: Medicaid Other | Admitting: Student

## 2016-07-14 ENCOUNTER — Ambulatory Visit (HOSPITAL_COMMUNITY): Payer: Medicaid Other

## 2016-07-21 ENCOUNTER — Encounter (HOSPITAL_COMMUNITY): Payer: Self-pay

## 2016-07-21 ENCOUNTER — Ambulatory Visit (HOSPITAL_COMMUNITY): Admission: RE | Admit: 2016-07-21 | Payer: Medicaid Other | Source: Ambulatory Visit

## 2016-08-16 ENCOUNTER — Ambulatory Visit (INDEPENDENT_AMBULATORY_CARE_PROVIDER_SITE_OTHER): Payer: Medicaid Other | Admitting: *Deleted

## 2016-08-16 DIAGNOSIS — Z3042 Encounter for surveillance of injectable contraceptive: Secondary | ICD-10-CM

## 2016-08-16 LAB — POCT URINE PREGNANCY: Preg Test, Ur: NEGATIVE

## 2016-08-16 MED ORDER — MEDROXYPROGESTERONE ACETATE 150 MG/ML IM SUSP
150.0000 mg | Freq: Once | INTRAMUSCULAR | Status: AC
  Administered 2016-08-16: 150 mg via INTRAMUSCULAR

## 2016-08-16 NOTE — Progress Notes (Signed)
   Patient in nurse clinic for Depo Provera injection.  Patient does not have an order to receive Depo Provera.  Precept with Dr. Mingo Amber, patient will need pregnancy test, if negative Depo Provera 150 mg IM x 1 today and follow up with PCP.  Pregnancy ordered; results negative.  Advised patient to use back up method for the next 2 weeks or avoid sex. Depo Provera given RUOQ. Reminder card given.  Next injection due December 12-26, 2017. Derl Barrow, RN

## 2016-10-25 ENCOUNTER — Encounter: Payer: Self-pay | Admitting: Family Medicine

## 2016-10-25 ENCOUNTER — Ambulatory Visit (INDEPENDENT_AMBULATORY_CARE_PROVIDER_SITE_OTHER): Payer: Medicaid Other | Admitting: Family Medicine

## 2016-10-25 ENCOUNTER — Telehealth: Payer: Self-pay | Admitting: Family Medicine

## 2016-10-25 VITALS — BP 126/58 | HR 64 | Temp 97.8°F | Wt 103.2 lb

## 2016-10-25 DIAGNOSIS — G8929 Other chronic pain: Secondary | ICD-10-CM

## 2016-10-25 DIAGNOSIS — N839 Noninflammatory disorder of ovary, fallopian tube and broad ligament, unspecified: Secondary | ICD-10-CM | POA: Diagnosis not present

## 2016-10-25 DIAGNOSIS — Z309 Encounter for contraceptive management, unspecified: Secondary | ICD-10-CM | POA: Diagnosis not present

## 2016-10-25 DIAGNOSIS — R87612 Low grade squamous intraepithelial lesion on cytologic smear of cervix (LGSIL): Secondary | ICD-10-CM

## 2016-10-25 DIAGNOSIS — N838 Other noninflammatory disorders of ovary, fallopian tube and broad ligament: Secondary | ICD-10-CM

## 2016-10-25 DIAGNOSIS — R102 Pelvic and perineal pain: Secondary | ICD-10-CM

## 2016-10-25 LAB — POCT URINALYSIS DIPSTICK
BILIRUBIN UA: NEGATIVE
GLUCOSE UA: NEGATIVE
Ketones, UA: NEGATIVE
Leukocytes, UA: NEGATIVE
Nitrite, UA: NEGATIVE
Protein, UA: 30
RBC UA: NEGATIVE
SPEC GRAV UA: 1.02
UROBILINOGEN UA: 0.2
pH, UA: 6.5

## 2016-10-25 LAB — POCT URINE PREGNANCY: Preg Test, Ur: NEGATIVE

## 2016-10-25 LAB — POCT UA - MICROSCOPIC ONLY

## 2016-10-25 MED ORDER — MEDROXYPROGESTERONE ACETATE 150 MG/ML IM SUSP: 150.0000 mg | INTRAMUSCULAR | Status: AC

## 2016-10-25 NOTE — Progress Notes (Signed)
Subjective:     Patient ID: Kaitlyn Hunt, female   DOB: May 25, 1991, 25 y.o.   MRN: [id number]  HPI Contraception management: Patient got Depo shot in Sept. She tolerated this well with no side effect. She will like to continue this shot.  Abnormal PAP: Here for follow up. Denies any vaginal discharge. Ovarian mass: Here for follow up. She continue to endorse B/L pelvic pain, on and off. Pain is cramping in nature. She is currently asymptomatic. Denies vaginal discharge or bleeding, no N/V. Denies diarrhea, her or constipation, but at times with bowel movement she will have pelvic pain. HM: Need flu shot   Current Outpatient Prescriptions on File Prior to Visit  Medication Sig Dispense Refill  . naproxen (NAPROSYN) 500 MG tablet Take 1 tablet (500 mg total) by mouth 2 (two) times daily with a meal. 30 tablet 0  . [DISCONTINUED] amitriptyline (ELAVIL) 25 MG tablet Take 1 tablet (25 mg total) by mouth at bedtime. (Patient not taking: Reported on 05/05/2015) 30 tablet 2  . [DISCONTINUED] QUEtiapine (SEROQUEL) 100 MG tablet Take 1 tablet (100 mg total) by mouth at bedtime. (Patient not taking: Reported on 05/05/2015) 30 tablet 2   No current facility-administered medications on file prior to visit.    Past Medical History:  Diagnosis Date  . 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 Bipolar 1 disorder ([place])    previously on Depakote D/C 5/11 bc pt stated it made her feel lazy   . Eclampsia in pregnancy   . 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   Cardiovascular: Negative.   Gastrointestinal: Negative.   Genitourinary: Positive for pelvic pain. Negative for dysuria, flank pain and frequency.  All other systems reviewed and are negative.      Objective:   Physical Exam  Constitutional: She is oriented to person, place, and time. She appears well-developed. No distress.  Cardiovascular: Normal rate, regular rhythm and  normal heart sounds.   No murmur heard. Pulmonary/Chest: Effort normal and breath sounds normal. No respiratory distress. She has no wheezes.  Abdominal: Soft. Bowel sounds are normal. She exhibits no distension and no mass. There is no tenderness.  Musculoskeletal: Normal range of motion. She exhibits no edema.  Neurological: She is alert and oriented to person, place, and time.  Nursing note and vitals reviewed.      Assessment:     Contraception management: Abnormal PAP: Ovarian mass: HM:    Plan:     Check problem list.  Note she declined flu shot today. She also stated she will reschedule PAP as she did not plan to get it done today.

## 2016-10-25 NOTE — Addendum Note (Signed)
Addended by: [place] Pink on: 10/25/2016 03:41 PM   Modules accepted: Orders

## 2016-10-25 NOTE — Telephone Encounter (Signed)
I was unable to reach patient about her urine result.  I will mail letter to her home address listed on file.

## 2016-10-25 NOTE — Assessment & Plan Note (Signed)
Now presenting with B/L pelvic pain. UA obtained to R/U UTI although unlikely. Upreg checked although she is on Depo. Upon review of her record, she she have gotten repeat U/S done as instructed by Gynecologist. She stated she did not follow up with it due to her schedule. Pelvic U/S ordered and scheduled during this visit. Use Tylenol as needed for pain. F/U as needed pending U/S result.

## 2016-10-25 NOTE — Patient Instructions (Signed)
It was nice seeing you today. I am sorry you are still experiencing pelvic pain. We will obtain pelvic ultrasound to further assess this. Use tylenol as needed for pain. We will also check urine pregnancy test as well as check your urine for infection. Please schedule PAP soon. Thanks.

## 2016-10-25 NOTE — Assessment & Plan Note (Signed)
Doing well on Depo. Next shot is Dec 26. May continue Depo injection.

## 2016-10-25 NOTE — Assessment & Plan Note (Signed)
S/P Colposcopy and biopsy. Recommended repeat PAP today. Patient stated she will prefer to reschedule PAP test till another time. F/U in 2 wks for PAP.

## 2016-10-28 ENCOUNTER — Ambulatory Visit (HOSPITAL_COMMUNITY): Payer: Medicaid Other

## 2016-11-03 ENCOUNTER — Ambulatory Visit (HOSPITAL_COMMUNITY)
Admission: RE | Admit: 2016-11-03 | Discharge: 2016-11-03 | Disposition: A | Discharge status: Home / Self Care | Payer: Medicaid Other | Source: Ambulatory Visit | Attending: Family Medicine | Admitting: Family Medicine

## 2016-11-03 DIAGNOSIS — N839 Noninflammatory disorder of ovary, fallopian tube and broad ligament, unspecified: Secondary | ICD-10-CM | POA: Insufficient documentation

## 2016-11-03 DIAGNOSIS — G8929 Other chronic pain: Secondary | ICD-10-CM | POA: Diagnosis not present

## 2016-11-03 DIAGNOSIS — D251 Intramural leiomyoma of uterus: Secondary | ICD-10-CM | POA: Diagnosis not present

## 2016-11-03 DIAGNOSIS — R102 Pelvic and perineal pain: Secondary | ICD-10-CM | POA: Diagnosis not present

## 2016-11-03 DIAGNOSIS — N838 Other noninflammatory disorders of ovary, fallopian tube and broad ligament: Secondary | ICD-10-CM

## 2016-11-04 ENCOUNTER — Telehealth: Payer: Self-pay | Admitting: Family Medicine

## 2016-11-04 NOTE — Telephone Encounter (Signed)
Pelvic U/S result discussed with her. All questions were answered.

## 2016-11-18 ENCOUNTER — Encounter (HOSPITAL_COMMUNITY): Payer: Self-pay | Admitting: Emergency Medicine

## 2016-11-18 ENCOUNTER — Emergency Department (HOSPITAL_COMMUNITY)
Admission: EM | Admit: 2016-11-18 | Discharge: 2016-11-18 | Disposition: A | Discharge status: Home / Self Care | Payer: Medicaid Other | Attending: Emergency Medicine | Admitting: Emergency Medicine

## 2016-11-18 DIAGNOSIS — K0510 Chronic gingivitis, plaque induced: Secondary | ICD-10-CM | POA: Diagnosis not present

## 2016-11-18 DIAGNOSIS — Z87891 Personal history of nicotine dependence: Secondary | ICD-10-CM | POA: Diagnosis not present

## 2016-11-18 DIAGNOSIS — F909 Attention-deficit hyperactivity disorder, unspecified type: Secondary | ICD-10-CM | POA: Diagnosis not present

## 2016-11-18 DIAGNOSIS — K0889 Other specified disorders of teeth and supporting structures: Secondary | ICD-10-CM | POA: Diagnosis present

## 2016-11-18 MED ORDER — CHLORHEXIDINE GLUCONATE 0.12 % MT SOLN: 15.0000 mL | Freq: Two times a day (BID) | OROMUCOSAL | 0 refills | Status: DC

## 2016-11-18 NOTE — ED Triage Notes (Signed)
Pt to ER by private vehicle with complaint of abscess to front upper gum in between front two teeth. Reports it has been there x2 weeks. Reports she does not have a Pharmacist, community.

## 2016-11-18 NOTE — ED Provider Notes (Signed)
[facility] DEPT Provider Note   CSN: [id number] Arrival date & time: 11/18/16  1345  By signing my name below, I, Kaitlyn Hunt, attest that this documentation has been prepared under the direction and in the presence of Montine Circle, PA-C. Electronically Signed: Gwenlyn Hunt, ED Scribe. 11/18/16. 2:00 PM.  History   Chief Complaint Chief Complaint  Patient presents with  . Dental Pain   The history is provided by the patient. No language interpreter was used.   HPI Comments: Kaitlyn Hunt is a 25 y.o. female who presents to the Emergency Department complaining of gradual onset, constant, moderate area of erythema and pain to the front upper gums for 2 weeks. She reports associated headache and mouth pain. Pt has not have a dentist.  Past Medical History:  Diagnosis Date  . 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 Bipolar 1 disorder ([place])    previously on Depakote D/C 5/11 bc pt stated it made her feel lazy   . Eclampsia in pregnancy   . Female pelvic inflammatory disease 10/17/2015    Patient Active Problem List   Diagnosis Date Noted  . Knee pain, bilateral 04/22/2016  . Ovarian mass, right 02/04/2016  . Low grade squamous intraepith lesion on cytologic smear cervix (lgsil) 01/22/2016  . Contraceptive management 01/19/2016  . Constipation 10/17/2015  . Low TSH level 04/27/2015  . Amenorrhea 09/23/2014  . Bipolar disorder ([place]) 10/09/2013  . SYSTOLIC MURMUR 99991111  . Unspecified episodic mood disorder 06/20/2008  . GOITER, UNSPECIFIED 10/31/2007  . PROBLEMS, BEHAVIORAL NEC 08/03/2007  . ATTENTION DEFICIT, W/HYPERACTIVITY 01/18/2007    Past Surgical History:  Procedure Laterality Date  . CESAREAN SECTION      OB History    Gravida Para Term Preterm AB Living   1 1   1        SAB TAB Ectopic Multiple Live Births                   Home Medications    Prior to Admission medications   Medication Sig Start Date End Date Taking? Authorizing  Provider  chlorhexidine (PERIDEX) 0.12 % solution Use as directed 15 mLs in the mouth or throat 2 (two) times daily. 11/18/16   Montine Circle, PA-C  naproxen (NAPROSYN) 500 MG tablet Take 1 tablet (500 mg total) by mouth 2 (two) times daily with a meal. 07/07/16   Donnamae Jude, MD    Family History Family History  Problem Relation Age of Onset  . Bipolar disorder Mother   . Bipolar disorder Maternal Aunt   . Bipolar disorder Sister     Social History Social History  Substance Use Topics  . Smoking status: Former Smoker    Packs/day: 0.50    Types: Cigarettes  . Smokeless tobacco: Never Used  . Alcohol use No     Allergies   Patient has no known allergies.   Review of Systems Review of Systems  HENT: Positive for dental problem.   Eyes: Positive for pain.  Neurological: Positive for headaches.   Physical Exam Updated Vital Signs BP 129/73 (BP Location: Right Arm)   Pulse 64   Temp 97.4 F (36.3 C)   Resp 16   LMP 09/07/2016   SpO2 100%   Physical Exam Physical Exam  Constitutional: Pt appears well-developed and well-nourished.  HENT:  Head: Normocephalic.  Right Ear: Tympanic membrane, external ear and ear canal normal.  Left Ear: Tympanic membrane, external ear and ear canal normal.  Nose: Nose normal. Right sinus exhibits no maxillary sinus tenderness and no frontal sinus tenderness. Left sinus exhibits no maxillary sinus tenderness and no frontal sinus tenderness.  Mouth/Throat: Uvula is midline, oropharynx is clear and moist and mucous membranes are normal. No oral lesions. No uvula swelling or lacerations. No oropharyngeal exudate, posterior oropharyngeal edema, posterior oropharyngeal erythema or tonsillar abscesses.  Poor dentition No gingival swelling, fluctuance or induration No gross abscess  No sublingual edema, tenderness to palpation, or sign of Ludwig's angina, or deep space infection Gingivitis  Eyes: Conjunctivae are normal. Pupils are equal,  round, and reactive to light. Right eye exhibits no discharge. Left eye exhibits no discharge.  Neck: Normal range of motion. Neck supple.  No stridor Handling secretions without difficulty No nuchal rigidity No cervical lymphadenopathy Cardiovascular: Normal rate, regular rhythm and normal heart sounds.   Pulmonary/Chest: Effort normal. No respiratory distress.  Equal chest rise  Abdominal: Soft. Bowel sounds are normal. Pt exhibits no distension. There is no tenderness.  Lymphadenopathy: Pt has no cervical adenopathy.  Neurological: Pt is alert and oriented x 4  Skin: Skin is warm and dry.  Psychiatric: Pt has a normal mood and affect.  Nursing note and vitals reviewed.    ED Treatments / Results  DIAGNOSTIC STUDIES: Oxygen Saturation is 100% on RA, normal by my interpretation.    COORDINATION OF CARE: 1:57 PM Discussed treatment plan with pt at bedside which includes Chlorhexidine and pt agreed to plan.  Labs (all labs ordered are listed, but only abnormal results are displayed) Labs Reviewed - No data to display  EKG  EKG Interpretation None       Radiology No results found.  Procedures Procedures (including critical care time)  Medications Ordered in ED Medications - No data to display   Initial Impression / Assessment and Plan / ED Course  I have reviewed the triage vital signs and the nursing notes.  Pertinent labs & imaging results that were available during my care of the patient were reviewed by me and considered in my medical decision making (see chart for details).  Clinical Course     Patient with gum pain.  No abscess requiring immediate incision and drainage.  Exam not concerning for Ludwig's angina or pharyngeal abscess.  Will treat with peridex mouth wash. Pt instructed to follow-up with dentist.  Discussed return precautions. Pt safe for discharge.   Final Clinical Impressions(s) / ED Diagnoses   Final diagnoses:  Gingivitis    New  Prescriptions New Prescriptions   CHLORHEXIDINE (PERIDEX) 0.12 % SOLUTION    Use as directed 15 mLs in the mouth or throat 2 (two) times daily.   I personally performed the services described in this documentation, which was scribed in my presence. The recorded information has been reviewed and is accurate.      Montine Circle, PA-C 11/18/16 [place], MD 11/18/16 [phone]

## 2016-11-18 NOTE — ED Notes (Signed)
Pt voices understanding of discharge instructions. Pt given "Know Your Options" brochure with coupon card at departure.

## 2017-07-24 ENCOUNTER — Encounter: Payer: Self-pay | Admitting: Family Medicine

## 2017-07-25 ENCOUNTER — Other Ambulatory Visit (HOSPITAL_COMMUNITY)
Admission: RE | Admit: 2017-07-25 | Discharge: 2017-07-25 | Disposition: A | Discharge status: Home / Self Care | Payer: Medicaid Other | Source: Ambulatory Visit | Attending: Family Medicine | Admitting: Family Medicine

## 2017-07-25 ENCOUNTER — Encounter: Payer: Self-pay | Admitting: Family Medicine

## 2017-07-25 ENCOUNTER — Ambulatory Visit (INDEPENDENT_AMBULATORY_CARE_PROVIDER_SITE_OTHER): Payer: Medicaid Other | Admitting: Family Medicine

## 2017-07-25 VITALS — BP 118/70 | HR 68 | Temp 98.0°F | Ht 63.0 in | Wt 97.6 lb

## 2017-07-25 DIAGNOSIS — Z0000 Encounter for general adult medical examination without abnormal findings: Secondary | ICD-10-CM

## 2017-07-25 DIAGNOSIS — Z124 Encounter for screening for malignant neoplasm of cervix: Secondary | ICD-10-CM | POA: Diagnosis not present

## 2017-07-25 DIAGNOSIS — F3170 Bipolar disorder, currently in remission, most recent episode unspecified: Secondary | ICD-10-CM | POA: Diagnosis not present

## 2017-07-25 MED ORDER — ALCLOMETASONE DIPROPIONATE 0.05 % EX CREA: TOPICAL_CREAM | Freq: Two times a day (BID) | CUTANEOUS | 0 refills | Status: DC

## 2017-07-25 MED ORDER — QUETIAPINE FUMARATE 100 MG PO TABS: 100.0000 mg | ORAL_TABLET | Freq: Every day | ORAL | 1 refills | Status: DC

## 2017-07-25 NOTE — Assessment & Plan Note (Signed)
PHQ9 score of 11. Not suicidal. Patient off meds. I restarted her on Seroquel. I recommended [facility] reassessment. If stable on meds I can refill her meds. F/U in 4 weeks for reassessment.

## 2017-07-25 NOTE — Patient Instructions (Signed)
Pityriasis Rosea Pityriasis rosea is a rash that usually appears on the trunk of the body. It may also appear on the upper arms and upper legs. It usually begins as a single patch, and then more patches begin to develop. The rash may cause mild itching, but it normally does not cause other problems. It usually goes away without treatment. However, it may take weeks or months for the rash to go away completely. What are the causes? The cause of this condition is not known. The condition does not spread from person to person (is noncontagious). What increases the risk? This condition is more likely to develop in young adults and children. It is most common in the spring and fall. What are the signs or symptoms? The main symptom of this condition is a rash.  The rash usually begins with a single oval patch that is larger than the ones that follow. This is called a herald patch. It generally appears a week or more before the rest of the rash appears.  When more patches start to develop, they spread quickly on the trunk, back, and arms. These patches are smaller than the first one.  The patches that make up the rash are usually oval-shaped and pink or red in color. They are usually flat, but they may sometimes be raised so that they can be felt with a finger. They may also be finely crinkled and have a scaly ring around the edge.  The rash does not typically appear on areas of the skin that are exposed to the sun.  Most people who have this condition do not have other symptoms, but some have mild itching. In a few cases, a mild headache or body aches may occur before the rash appears and then go away. How is this diagnosed? Your health care provider may diagnose this condition by doing a physical exam and taking your medical history. To rule out other possible causes for the rash, the health care provider may order blood tests or take a skin sample from the rash to be looked at under a microscope. How  is this treated? Usually, treatment is not needed for this condition. The rash will probably go away on its own in 4-8 weeks. In some cases, a health care provider may recommend or prescribe medicine to reduce itching. Follow these instructions at home:  Take medicines only as directed by your health care provider.  Avoid scratching the affected areas of skin.  Do not take hot baths or use a sauna. Use only warm water when bathing or showering. Heat can increase itching. Contact a health care provider if:  Your rash does not go away in 8 weeks.  Your rash gets much worse.  You have a fever.  You have swelling or pain in the rash area.  You have fluid, blood, or pus coming from the rash area. This information is not intended to replace advice given to you by your health care provider. Make sure you discuss any questions you have with your health care provider. Document Released: 12/14/2001 Document Revised: 04/14/2016 Document Reviewed: 10/15/2014 Elsevier Interactive Patient Education  2018 Elsevier Inc.  

## 2017-07-25 NOTE — Progress Notes (Signed)
Subjective:     Kaitlyn Hunt is a 26 y.o. female and is here for a comprehensive physical exam. The patient reports problems - Skin rash. C/O rash on her belly which is itchy x 1 week. Now clearing up. Denies change in body,nieces and nephew had similar rash. Bipolar:patient was on Seroquel 100 mg qhs which helps with her symptoms. However, she stopped going to [facility] due to prolong wait time to see a provider. Patient stated her symptoms is getting worse. Few days ago she had racing thoughts. She denies suicidal ideation today.  Social History   Social History  . Marital status: Single    Spouse name: N/A  . Number of children: 0  . Years of education: 10   Occupational History  .  Unemployed   Social History Main Topics  . Smoking status: Former Smoker    Packs/day: 0.50    Types: Cigarettes  . Smokeless tobacco: Never Used  . Alcohol use No  . Drug use: No  . Sexual activity: Yes    Partners: Male    Birth control/ protection: Injection     Comment: with boyfriend   Other Topics Concern  . Not on file   Social History Narrative   Lives at home with mom. [name]) is involved.          Health Maintenance  Topic Date Due  . PAP SMEAR  05/04/2016  . INFLUENZA VACCINE  02/18/2018 (Originally 06/21/2017)  . TETANUS/TDAP  05/06/2024  . HIV Screening  Completed    The following portions of the patient's history were reviewed and updated as appropriate: allergies, current medications, past family history, past medical history, past social history, past surgical history and problem list.  Review of Systems Pertinent items noted in HPI and remainder of comprehensive ROS otherwise negative.   Objective:    BP 118/70   Pulse 68   Temp 98 F (36.7 C) (Oral)   Ht 5\' 3"  (1.6 m)   Wt 97 lb 9.6 oz (44.3 kg)   LMP 07/06/2017 (Exact Date)   SpO2 99%   BMI 17.29 kg/m  General appearance: alert and cooperative Head: Normocephalic, without obvious  abnormality, atraumatic Eyes: conjunctivae/corneas clear. PERRL, EOM's intact. Fundi benign. Ears: normal TM's and external ear canals both ears Throat: lips, mucosa, and tongue normal; teeth and gums normal Neck: no adenopathy, no carotid bruit, no JVD, supple, symmetrical, trachea midline and thyroid not enlarged, symmetric, no tenderness/mass/nodules Lungs: clear to auscultation bilaterally Heart: regular rate and rhythm, S1, S2 normal, no murmur, click, rub or gallop Abdomen: soft, non-tender; bowel sounds normal; no masses,  no organomegaly Pelvic: cervix normal in appearance, external genitalia normal, no adnexal masses or tenderness, no cervical motion tenderness, rectovaginal septum normal, uterus normal size, shape, and consistency and vagina normal without discharge Extremities: extremities normal, atraumatic, no cyanosis or edema Skin: Pinkish, small, scattered macular rash on her adomen and lower back Neurologic: Alert and oriented X 3, normal strength and tone. Normal symmetric reflexes. Normal coordination and gait    Assessment:    Healthy female exam.   Pityriasis  Bipolar Underweight     Plan:    Well exam completed. PAP and GC/Chlamydia done. I will call her with result.   Topical steroid prescribed for her rash. F/U soon if no improvement.   Diet and stress relieve counseling done for her BMI <18.   I recommended a bottle of ensure a day in addition to 3 square meal.  Reassess in 4 weeks.   Bipolar see problem list.

## 2017-07-27 ENCOUNTER — Telehealth: Payer: Self-pay

## 2017-07-27 LAB — CERVICOVAGINAL ANCILLARY ONLY
Chlamydia: NEGATIVE
Neisseria Gonorrhea: NEGATIVE
TRICH (WINDOWPATH): NEGATIVE

## 2017-07-27 LAB — CYTOLOGY - PAP: HPV: DETECTED — AB

## 2017-07-27 NOTE — Telephone Encounter (Signed)
-----   Message from [name], MD sent at 07/27/2017 11:12 AM EDT ----- Please call to inform patient that her GC/Chlamydia and trichomonas are all negative. Thanks.

## 2017-07-27 NOTE — Telephone Encounter (Signed)
Pt contacted and informed of normal std results.

## 2017-07-28 ENCOUNTER — Telehealth: Payer: Self-pay | Admitting: *Deleted

## 2017-07-28 ENCOUNTER — Telehealth: Payer: Self-pay | Admitting: Family Medicine

## 2017-07-28 MED ORDER — QUETIAPINE FUMARATE 100 MG PO TABS: 100.0000 mg | ORAL_TABLET | Freq: Every day | ORAL | 0 refills | Status: DC

## 2017-07-28 NOTE — Telephone Encounter (Addendum)
I changed Seroquel to generic form. I called pharmacy since meds is on preferred list. However, they stated I needed to completed safety documentation.  I am not sure what this document is and the pharmacist/tech could not explain either. I completed the prior authorization form provided and placed it up front for faxing.

## 2017-07-28 NOTE — Telephone Encounter (Signed)
Received fax request for prior authorization from pharmacy for gen. Seroquel.  Copy of Medicaid formulary and form placed in MD's box for completion.  Burna Forts, BSN, RN-BC

## 2017-07-28 NOTE — Telephone Encounter (Signed)
PAP result discussed with the patient. All questions were answered. I scheduled colpo clinic appointment for her.

## 2017-08-09 ENCOUNTER — Telehealth: Payer: Self-pay

## 2017-08-09 NOTE — Telephone Encounter (Signed)
Contacted pt to inform of pcp note, pt stated she received a call from the pharmacy stating, "medication ready for pick up." Pt has not yet picked up medication from the pharmacy, pt does not know how much it will be. I advised her if insurance does not cover, she will have to contact her psych MD.

## 2017-08-09 NOTE — Telephone Encounter (Signed)
-----   Message from [name], MD sent at 08/07/2017  1:50 PM EDT ----- I keep getting PA for Seroquel for patient. I have completed all required informations. I tried to do it online as well but I could not get through.  Please call to inform patient she will need to see her psych for medication management since they are questing informations I am unable to provide. I called pharmacy previously and they themselves could not tell me exactly what is needed. Thanks.

## 2017-08-10 ENCOUNTER — Ambulatory Visit: Payer: Medicaid Other

## 2017-08-10 NOTE — Progress Notes (Deleted)
   Subjective:    Patient ID: [name], female    DOB: [birth year], 26 y.o.   MRN: [id number]   CC:  HPI:  Pap smear on 07/25/17: LOW GRADE SQUAMOUS INTRAEPITHELIAL LESION: CIN-1/ HPV (LSIL) + HPV Abnormal Pap in 2016. Cervical biopsy in 2017 was normal.   Smoking status reviewed  Review of Systems   Objective:  There were no vitals taken for this visit. Vitals and nursing note reviewed  General: well nourished, in no acute distress HEENT: normocephalic, TM's visualized bilaterally, no scleral icterus or conjunctival pallor, no nasal discharge, moist mucous membranes, good dentition without erythema or discharge noted in posterior oropharynx Neck: supple, non-tender, without lymphadenopathy Cardiac: RRR, clear S1 and S2, no murmurs, rubs, or gallops Respiratory: clear to auscultation bilaterally, no increased work of breathing Abdomen: soft, nontender, nondistended, no masses or organomegaly. Bowel sounds present Extremities: no edema or cyanosis. Warm, well perfused. 2+ radial and PT pulses bilaterally Skin: warm and dry, no rashes noted Neuro: alert and oriented, no focal deficits   Assessment & Plan:    No problem-specific Assessment & Plan notes found for this encounter.    No Follow-up on file.   Caroline More, DO, PGY-1

## 2017-08-15 ENCOUNTER — Other Ambulatory Visit: Payer: Self-pay | Admitting: Family Medicine

## 2017-08-29 ENCOUNTER — Emergency Department (HOSPITAL_COMMUNITY)
Admission: EM | Admit: 2017-08-29 | Discharge: 2017-08-29 | Discharge status: Against Medical Advice | Payer: Medicaid Other | Attending: Emergency Medicine | Admitting: Emergency Medicine

## 2017-08-29 DIAGNOSIS — Z5321 Procedure and treatment not carried out due to patient leaving prior to being seen by health care provider: Secondary | ICD-10-CM | POA: Insufficient documentation

## 2017-08-29 DIAGNOSIS — Z041 Encounter for examination and observation following transport accident: Secondary | ICD-10-CM | POA: Diagnosis present

## 2017-08-29 NOTE — ED Notes (Signed)
Called patient to be bedded x 3 and no answer.

## 2017-08-29 NOTE — ED Notes (Signed)
Called for Pt to be roomed no response x1
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
## 2017-08-30 ENCOUNTER — Encounter: Payer: Self-pay | Admitting: Internal Medicine

## 2017-08-30 ENCOUNTER — Ambulatory Visit (INDEPENDENT_AMBULATORY_CARE_PROVIDER_SITE_OTHER): Payer: Medicaid Other | Admitting: Internal Medicine

## 2017-08-30 DIAGNOSIS — Z041 Encounter for examination and observation following transport accident: Secondary | ICD-10-CM | POA: Diagnosis present

## 2017-08-30 MED ORDER — NAPROXEN 500 MG PO TABS: 500.0000 mg | ORAL_TABLET | Freq: Two times a day (BID) | ORAL | 0 refills | Status: DC

## 2017-08-30 MED ORDER — CYCLOBENZAPRINE HCL 5 MG PO TABS: 5.0000 mg | ORAL_TABLET | Freq: Three times a day (TID) | ORAL | 0 refills | Status: DC | PRN

## 2017-08-30 NOTE — Patient Instructions (Signed)
Take Naproxen 500mg  twice a day as needed for pain. Take with food.  You can also take Tylenol  I prescribed Flexeril to take as needed. This is a muscle relaxer. This can make you sleepy
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
## 2017-08-30 NOTE — Progress Notes (Deleted)
   [facility] Phone: [phone]   Date of Visit: 08/30/2017   HPI:  ***  ROS: See HPI.  [place]: ***  PHYSICAL EXAM: BP (!) 96/58   Pulse (!) 58   Temp 98 F (36.7 C) (Oral)   Wt 97 lb (44 kg)   LMP 08/27/2017   SpO2 99%   BMI 17.18 kg/m  Gen: *** HEENT: *** Heart: *** Lungs: *** Neuro: *** Ext: ***  ASSESSMENT/PLAN:  Health maintenance:  -***  No problem-specific Assessment & Plan notes found for this encounter.  FOLLOW UP: Follow up in *** for ***  Smiley Houseman, MD PGY [place]

## 2017-09-05 ENCOUNTER — Encounter (HOSPITAL_COMMUNITY): Payer: Self-pay | Admitting: Emergency Medicine

## 2017-09-05 ENCOUNTER — Emergency Department (HOSPITAL_COMMUNITY)
Admission: EM | Admit: 2017-09-05 | Discharge: 2017-09-05 | Disposition: A | Discharge status: Home / Self Care | Payer: Medicaid Other | Attending: Emergency Medicine | Admitting: Emergency Medicine

## 2017-09-05 DIAGNOSIS — M545 Low back pain: Secondary | ICD-10-CM | POA: Diagnosis not present

## 2017-09-05 DIAGNOSIS — M7918 Myalgia, other site: Secondary | ICD-10-CM

## 2017-09-05 DIAGNOSIS — Z79899 Other long term (current) drug therapy: Secondary | ICD-10-CM | POA: Insufficient documentation

## 2017-09-05 DIAGNOSIS — G8929 Other chronic pain: Secondary | ICD-10-CM | POA: Diagnosis not present

## 2017-09-05 DIAGNOSIS — Z87891 Personal history of nicotine dependence: Secondary | ICD-10-CM | POA: Diagnosis not present

## 2017-09-05 DIAGNOSIS — Z041 Encounter for examination and observation following transport accident: Secondary | ICD-10-CM | POA: Diagnosis present
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
## 2017-09-05 NOTE — Discharge Instructions (Signed)
As discussed, continue with naproxen and muscle relaxer and make sure that you do some back exercises as immobilization will worsen back pain.  Follow-up with her primary care provider if symptoms persist beyond this week. You may apply heat to the area to help with spasm. Topical creams such as icy hot may be helpful as well. Return if symptoms worsen, weakness, numbness, loss of bowel or bladder function or other new concerning symptoms in the meantime.
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
## 2017-10-26 ENCOUNTER — Other Ambulatory Visit: Payer: Self-pay | Admitting: Family Medicine

## 2017-10-26 NOTE — Telephone Encounter (Signed)
Pt called to say she went to refill her seroquel and the pharmacy said there is a problem with her insurance not covering it. Please contact pt concerning this issue. Pleas advise

## 2017-10-27 MED ORDER — QUETIAPINE FUMARATE 100 MG PO TABS: 100.0000 mg | ORAL_TABLET | Freq: Every day | ORAL | 0 refills | Status: DC

## 2017-10-27 NOTE — Telephone Encounter (Signed)
LM for patient to call back regarding her medication concerns.  I checked her chart and I don't see where she has a recent refill for this medication.  I will send it to provider to refill and see if a PA comes back. Jazmin [name],CMA

## 2017-10-27 NOTE — Telephone Encounter (Signed)
Pt returned call.  She states that the script given to her on 07/28/17 was not filled until 09/13/17 (verified this with the pharmacy).  Per pharmacy, her medicaid is inactive so she will need to get that fixed first.    Verified that medicaid was inactive with Amalia Hailey, ASA who checked online with same results.  Pt informed and she will call her caseworker.  Zyasia Halbleib, Salome Spotted, CMA

## 2017-10-27 NOTE — Telephone Encounter (Signed)
Will check with nurse clinic to see if a PA was received for this patient. Jazmin [name],CMA

## 2017-10-27 NOTE — Telephone Encounter (Signed)
No PA received. Please ask patient to contact pharmacy and ask them to send PA request. Hubbard Hartshorn, RN, BSN '

## 2017-12-12 IMAGING — CT CT PELVIS W/ CM
1 series · 1 of 1 positions shown · IV contrast (omnipaque)
Comparison: Pelvic ultrasound and CT 10/06/2015.

CLINICAL DATA: Mid abdominal pain. Follow-up adnexal mass seen on
CT.

EXAM:
CT PELVIS WITH CONTRAST
TECHNIQUE: Multidetector CT imaging of the pelvis was performed using the
standard protocol following the bolus administration of intravenous
contrast.
CONTRAST:  60mL OMNIPAQUE IOHEXOL 300 MG/ML  SOLN

[Series 100: scout · coronal · 0.6mm · 0.98mm/px · 1 of 1 slices shown]
[im 1/1]
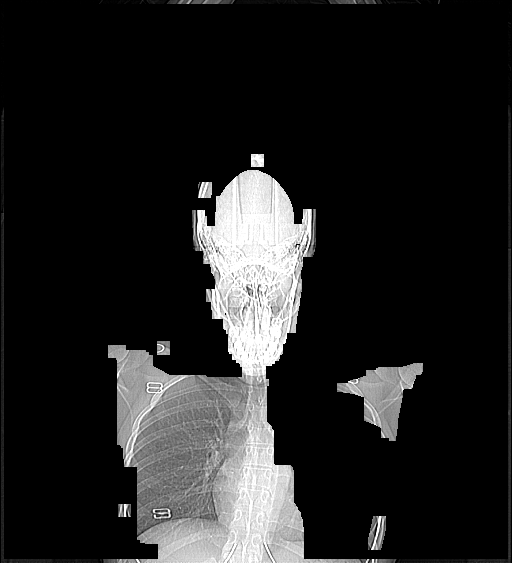

[1 of 1 positions shown; findings below may reference images not displayed]

FINDINGS: Urinary Tract: The visualized distal ureters and bladder appear
unremarkable.

Bowel: No enteric contrast administered. No bowel wall thickening,
distention or surrounding inflammation identified within the pelvis.

Vascular/Lymphatic: No enlarged pelvic lymph nodes identified. No
significant vascular findings.

Reproductive: Evaluation limited by paucity of intraperitoneal fat
and lack of enteric contrast. The uterus is retroverted. No adnexal
mass identified a small amount of free pelvic fluid is within
physiologic limits.

Other: The anterior pelvic wall appears unremarkable.

Musculoskeletal: No acute or worrisome osseous findings.
IMPRESSION: 1. No definite residual adnexal abnormality identified by CT.
Adnexal evaluation limited by paucity of intraperitoneal fat and
lack of enteric contrast.
2. Pelvic ultrasound follow up recommended.

## 2018-06-03 ENCOUNTER — Other Ambulatory Visit: Payer: Self-pay

## 2018-06-03 ENCOUNTER — Encounter (HOSPITAL_COMMUNITY): Payer: Self-pay | Admitting: *Deleted

## 2018-06-03 ENCOUNTER — Emergency Department (HOSPITAL_COMMUNITY)
Admission: EM | Admit: 2018-06-03 | Discharge: 2018-06-03 | Disposition: A | Discharge status: Home / Self Care | Payer: Medicaid Other | Attending: Emergency Medicine | Admitting: Emergency Medicine

## 2018-06-03 DIAGNOSIS — Z87891 Personal history of nicotine dependence: Secondary | ICD-10-CM | POA: Diagnosis not present

## 2018-06-03 DIAGNOSIS — R1030 Lower abdominal pain, unspecified: Secondary | ICD-10-CM | POA: Diagnosis not present

## 2018-06-03 LAB — COMPREHENSIVE METABOLIC PANEL
ALK PHOS: 65 U/L (ref 38–126)
ALT: 13 U/L (ref 0–44)
ANION GAP: 10 (ref 5–15)
AST: 16 U/L (ref 15–41)
Albumin: 4.1 g/dL (ref 3.5–5.0)
BUN: 12 mg/dL (ref 6–20)
CALCIUM: 9.6 mg/dL (ref 8.9–10.3)
CO2: 25 mmol/L (ref 22–32)
Chloride: 103 mmol/L (ref 98–111)
Creatinine, Ser: 0.92 mg/dL (ref 0.44–1.00)
GFR calc Af Amer: >60 mL/min (ref >60)
GFR calc non Af Amer: >60 mL/min (ref >60)
GLUCOSE: 92 mg/dL (ref 70–99)
Potassium: 3.9 mmol/L (ref 3.5–5.1)
SODIUM: 138 mmol/L (ref 135–145)
Total Bilirubin: 0.8 mg/dL (ref 0.3–1.2)
Total Protein: 7.3 g/dL (ref 6.5–8.1)

## 2018-06-03 LAB — URINALYSIS, ROUTINE W REFLEX MICROSCOPIC
Bilirubin Urine: NEGATIVE
Glucose, UA: NEGATIVE mg/dL
Hgb urine dipstick: NEGATIVE
KETONES UR: NEGATIVE mg/dL
LEUKOCYTES UA: NEGATIVE
NITRITE: NEGATIVE
PROTEIN: NEGATIVE mg/dL
Specific Gravity, Urine: 1.02 (ref 1.005–1.030)
pH: 7 (ref 5.0–8.0)

## 2018-06-03 LAB — CBC
HEMATOCRIT: 41.5 % (ref 36.0–46.0)
HEMOGLOBIN: 14 g/dL (ref 12.0–15.0)
MCH: 31.2 pg (ref 26.0–34.0)
MCHC: 33.7 g/dL (ref 30.0–36.0)
MCV: 92.4 fL (ref 78.0–100.0)
Platelets: 225 10*3/uL (ref 150–400)
RBC: 4.49 MIL/uL (ref 3.87–5.11)
RDW: 12.8 % (ref 11.5–15.5)
WBC: 10.3 10*3/uL (ref 4.0–10.5)

## 2018-06-03 LAB — I-STAT BETA HCG BLOOD, ED (MC, WL, AP ONLY)

## 2018-06-03 LAB — LIPASE, BLOOD: Lipase: 29 U/L (ref 11–51)

## 2018-06-03 NOTE — ED Triage Notes (Signed)
The pt is c/o abd pain since Thursday with nausea no vomiting or diarrhea  lmp June 29

## 2018-06-03 NOTE — Discharge Instructions (Addendum)
Antiinflammatory medications: Take 600 mg of ibuprofen every 6 hours or 440 mg (over the counter dose) to 500 mg (prescription dose) of naproxen every 12 hours for the next 3 days. After this time, these medications may be used as needed for pain. Take these medications with food to avoid upset stomach. Choose only one of these medications, do not take them together. Tylenol: Should you continue to have additional pain while taking the ibuprofen or naproxen, you may add in tylenol as needed. Your daily total maximum amount of tylenol from all sources should be limited to 4000mg /day for persons without liver problems, or 2000mg /day for those with liver problems.   Constipation:  Hydration: You should start drinking at least ten, 8 oz glasses of water a day to help relieve your constipation. Baseline hydration should be at least eight, 8 oz glasses of water a day. This is a daily amount that you should be drinking, even without your current issue. Proper hydration not only helps prevent constipation, it is essential for any of the following treatments to be effective.  Fiber: Begin taking a fiber supplement daily. You should also increase the fiber in your diet.  MiraLAX: You may begin taking MiraLAX daily until you are having at least 1 soft bowel movement a day.  Follow-up: Follow-up with your primary care provider as soon as possible for continued management of this issue.  Return: Return to the ED should any symptoms worsen.

## 2018-06-03 NOTE — ED Provider Notes (Signed)
[facility] EMERGENCY DEPARTMENT Provider Note   CSN: [id number] Arrival date & time: 06/03/18  1844     History   Chief Complaint Chief Complaint  Patient presents with  . Abdominal Pain    HPI Kaitlyn Hunt is a 27 y.o. female.  HPI  Kaitlyn Hunt is a 27 y.o. female, with a history of bipolar and ovarian cyst, presenting to the ED with lower abdominal pain for the past 4 days.  Pain is suprapubic, intermittent, radiating to the right lower quadrant, currently 8/10.  Patient states she has had similar pain with ovarian cysts in the past.  She is also concerned that she is constipated and that this may be causing her pain.  Her last bowel movement was 2 days ago.  She has had no difficulty passing gas. Denies fever/chills, N/V/D, abnormal vaginal discharge/bleeding, hematochezia/melena, dysuria, hematuria, or any other complaints.   Past Medical History:  Diagnosis Date  . 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 Bipolar 1 disorder ([place])    previously on Depakote D/C 5/11 bc pt stated it made her feel lazy   . Eclampsia in pregnancy   . 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 Ovarian mass, right 02/04/2016    Patient Active Problem List   Diagnosis Date Noted  . Low grade squamous intraepith lesion on cytologic smear cervix (lgsil) 01/22/2016  . Low TSH level 04/27/2015  . Amenorrhea 09/23/2014  . Bipolar disorder ([place]) 10/09/2013  . SYSTOLIC MURMUR 54/62/7035  . Unspecified episodic mood disorder 06/20/2008  . GOITER, UNSPECIFIED 10/31/2007  . PROBLEMS, BEHAVIORAL NEC 08/03/2007  . ATTENTION DEFICIT, W/HYPERACTIVITY 01/18/2007    Past Surgical History:  Procedure Laterality Date  . CESAREAN SECTION       OB History    Gravida  1   Para  1   Term      Preterm  1   AB      Living        SAB      TAB      Ectopic      Multiple      Live Births               Home Medications    Prior to  Admission medications   Medication Sig Start Date End Date Taking? Authorizing Provider  alclomethasone (ACLOVATE) 0.05 % cream Apply topically 2 (two) times daily. Patient not taking: Reported on 06/03/2018 07/25/17   [name], MD  cyclobenzaprine (FLEXERIL) 5 MG tablet Take 1 tablet (5 mg total) by mouth 3 (three) times daily as needed for muscle spasms. Patient not taking: Reported on 06/03/2018 08/30/17   Smiley Houseman, MD  naproxen (NAPROSYN) 500 MG tablet Take 1 tablet (500 mg total) by mouth 2 (two) times daily with a meal. Patient not taking: Reported on 06/03/2018 08/30/17   Smiley Houseman, MD  QUEtiapine (SEROQUEL) 100 MG tablet Take 1 tablet (100 mg total) by mouth at bedtime. Patient not taking: Reported on 06/03/2018 10/27/17   [name], MD  amitriptyline (ELAVIL) 25 MG tablet Take 1 tablet (25 mg total) by mouth at bedtime. Patient not taking: Reported on 05/05/2015 04/21/15 10/06/15  Virginia Crews, MD    Family History Family History  Problem Relation Age of Onset  . Bipolar disorder Mother   . Bipolar disorder Maternal Aunt   . Bipolar disorder Sister     Social History Social History  Tobacco Use  . Smoking status: Former Smoker    Packs/day: 0.50    Types: Cigarettes  . Smokeless tobacco: Never Used  Substance Use Topics  . Alcohol use: No  . Drug use: No     Allergies   Patient has no known allergies.   Review of Systems Review of Systems  Constitutional: Negative for chills, diaphoresis and fever.  Respiratory: Negative for shortness of breath.   Cardiovascular: Negative for chest pain.  Gastrointestinal: Positive for abdominal pain. Negative for diarrhea, nausea and vomiting.  Genitourinary: Negative for dysuria, flank pain, frequency, hematuria, vaginal bleeding and vaginal discharge.  Musculoskeletal: Negative for back pain.  Neurological: Negative for weakness and numbness.  All other systems reviewed and are  negative.    Physical Exam Updated Vital Signs BP (!) 131/95 (BP Location: Right Arm)   Pulse (!) 123   Temp 98.5 F (36.9 C) (Oral)   Resp 19   Ht 5\' 3"  (1.6 m)   Wt 47.6 kg (105 lb)   LMP 05/19/2018   SpO2 100%   BMI 18.60 kg/m   Physical Exam  Constitutional: She appears well-developed and well-nourished. No distress.  HENT:  Head: Normocephalic and atraumatic.  Eyes: Conjunctivae are normal.  Neck: Neck supple.  Cardiovascular: Normal rate, regular rhythm, normal heart sounds and intact distal pulses.  No tachycardia on my exam.  Pulmonary/Chest: Effort normal and breath sounds normal. No respiratory distress.  Abdominal: Soft. Bowel sounds are normal. There is no tenderness. There is no guarding.  No tenderness in the abdomen whatsoever.  Musculoskeletal: She exhibits no edema.  Lymphadenopathy:    She has no cervical adenopathy.  Neurological: She is alert.  Skin: Skin is warm and dry. She is not diaphoretic.  Psychiatric: She has a normal mood and affect. Her behavior is normal.  Nursing note and vitals reviewed.    ED Treatments / Results  Labs (all labs ordered are listed, but only abnormal results are displayed) Labs Reviewed  URINALYSIS, ROUTINE W REFLEX MICROSCOPIC - Abnormal; Notable for the following components:      Result Value   APPearance HAZY (*)    All other components within normal limits  LIPASE, BLOOD  COMPREHENSIVE METABOLIC PANEL  CBC  I-STAT BETA HCG BLOOD, ED ([facility], [facility], [facility] ONLY)    EKG None  Radiology No results found.  Procedures Procedures (including critical care time)  Medications Ordered in ED Medications - No data to display   Initial Impression / Assessment and Plan / ED Course  I have reviewed the triage vital signs and the nursing notes.  Pertinent labs & imaging results that were available during my care of the patient were reviewed by me and considered in my medical decision making (see chart for details).      Patient presents with intermittent abdominal pain. Patient is nontoxic appearing, afebrile, not tachycardic on my exam, not tachypneic, not hypotensive, and is in no apparent distress.  We discussed further work-up, including pelvic exam, swabs, and imaging.  Patient declined stating she will follow-up with her PCP tomorrow.  Strict return precautions discussed. Patient voices understanding of these instructions, accepts the plan, and is comfortable with discharge.   Vitals:   06/03/18 1854 06/03/18 1857 06/03/18 2313  BP: (!) 131/95  127/89  Pulse: (!) 123  78  Resp: 19    Temp: 98.5 F (36.9 C)  98 F (36.7 C)  TempSrc: Oral  Oral  SpO2: 100%  99%  Weight:  47.6  kg (105 lb)   Height:  5\' 3"  (1.6 m)      Final Clinical Impressions(s) / ED Diagnoses   Final diagnoses:  Lower abdominal pain    ED Discharge Orders    None       Layla Maw 06/03/18 2319    Julianne Rice, MD 06/07/18 2131

## 2018-08-09 ENCOUNTER — Encounter: Payer: Self-pay | Admitting: Family Medicine

## 2018-08-10 ENCOUNTER — Other Ambulatory Visit (HOSPITAL_COMMUNITY)
Admission: RE | Admit: 2018-08-10 | Discharge: 2018-08-10 | Disposition: A | Discharge status: Home / Self Care | Payer: Medicaid Other | Source: Ambulatory Visit | Attending: Family Medicine | Admitting: Family Medicine

## 2018-08-10 ENCOUNTER — Other Ambulatory Visit: Payer: Self-pay

## 2018-08-10 ENCOUNTER — Ambulatory Visit (INDEPENDENT_AMBULATORY_CARE_PROVIDER_SITE_OTHER): Payer: Medicaid Other | Admitting: Family Medicine

## 2018-08-10 ENCOUNTER — Encounter: Payer: Self-pay | Admitting: Family Medicine

## 2018-08-10 ENCOUNTER — Ambulatory Visit: Payer: Medicaid Other | Admitting: Licensed Clinical Social Worker

## 2018-08-10 VITALS — BP 122/70 | HR 85 | Temp 97.7°F | Ht 63.0 in | Wt 100.2 lb

## 2018-08-10 DIAGNOSIS — F3170 Bipolar disorder, currently in remission, most recent episode unspecified: Secondary | ICD-10-CM

## 2018-08-10 DIAGNOSIS — Z01419 Encounter for gynecological examination (general) (routine) without abnormal findings: Secondary | ICD-10-CM | POA: Diagnosis not present

## 2018-08-10 DIAGNOSIS — Z0000 Encounter for general adult medical examination without abnormal findings: Secondary | ICD-10-CM | POA: Insufficient documentation

## 2018-08-10 NOTE — Progress Notes (Signed)
Patient ID: Kaitlyn Hunt, female   DOB: 07-27-1991, 27 y.o.   MRN: [id number] Subjective:     Kaitlyn Hunt is a 27 y.o. female and is here for a comprehensive physical exam. The patient reports occasional headache. Currently asymptomatic.  Social History   Socioeconomic History  . Marital status: Single    Spouse name: Not on file  . Number of children: 0  . Years of education: 10  . Highest education level: Not on file  Occupational History    Employer: UNEMPLOYED  Social Needs  . Financial resource strain: Not on file  . Food insecurity:    Worry: Not on file    Inability: Not on file  . Transportation needs:    Medical: Not on file    Non-medical: Not on file  Tobacco Use  . Smoking status: Former Smoker    Packs/day: 0.50    Types: Cigarettes  . Smokeless tobacco: Never Used  Substance and Sexual Activity  . Alcohol use: No  . Drug use: No  . Sexual activity: Yes    Partners: Male    Birth control/protection: Injection    Comment: with boyfriend  Lifestyle  . Physical activity:    Days per week: Not on file    Minutes per session: Not on file  . Stress: Not on file  Relationships  . Social connections:    Talks on phone: Not on file    Gets together: Not on file    Attends religious service: Not on file    Active member of club or organization: Not on file    Attends meetings of clubs or organizations: Not on file    Relationship status: Not on file  . Intimate partner violence:    Fear of current or ex partner: Not on file    Emotionally abused: Not on file    Physically abused: Not on file    Forced sexual activity: Not on file  Other Topics Concern  . Not on file  Social History Narrative   Lives at home with mom. [name]) is involved.          Health Maintenance  Topic Date Due  . INFLUENZA VACCINE  06/21/2018  . PAP SMEAR  07/25/2018  . TETANUS/TDAP  05/06/2024  . HIV Screening  Completed    The following portions of  the patient's history were reviewed and updated as appropriate: allergies, current medications, past family history, past medical history, past social history, past surgical history and problem list.  Review of Systems Pertinent items noted in HPI and remainder of comprehensive ROS otherwise negative.   Objective:    BP 122/70 (BP Location: Left Arm)   Pulse 85   Temp 97.7 F (36.5 C) (Oral)   Ht '5\' 3"'  (1.6 m)   Wt 100 lb 3.2 oz (45.5 kg)   LMP 08/04/2018   SpO2 99%   BMI 17.75 kg/m  General appearance: alert, cooperative and appears stated age Head: Normocephalic, without obvious abnormality, atraumatic Eyes: conjunctivae/corneas clear. PERRL, EOM's intact. Fundi benign. Ears: normal TM's and external ear canals both ears Throat: lips, mucosa, and tongue normal; teeth and gums normal Neck: no adenopathy, no carotid bruit, no JVD, supple, symmetrical, trachea midline and thyroid not enlarged, symmetric, no tenderness/mass/nodules Lungs: clear to auscultation bilaterally Heart: regular rate and rhythm, S1, S2 normal, no murmur, click, rub or gallop Abdomen: soft, non-tender; bowel sounds normal; no masses,  no organomegaly Extremities: extremities normal, atraumatic, no  cyanosis or edema Skin: Skin color, texture, turgor normal. No rashes or lesions Lymph nodes: Cervical, supraclavicular, and axillary nodes normal. Neurologic: Alert and oriented X 3, normal strength and tone. Normal symmetric reflexes. Normal coordination and gait      Office Visit from 08/10/2018 in [facility]  PHQ-9 Total Score  8      Assessment:    Healthy female exam. PAP completed    Bipolar   Plan:    Flu shot offered but she declined. PAP completed. I will contact her soon with the result. She is not compliant with medication and follow-up for bipolar. [facility] consulted today and they met with her. She will return to [facility] for further management. See After Visit Summary for  Counseling Recommendations

## 2018-08-10 NOTE — Progress Notes (Signed)
Type of Service: [place] Time:20 minutes Interpreter:No.   Kaitlyn Hunt is a 27 y.o. female referred by Dr. Gwendlyn Deutscher for reestablishing care for treatment for bipolar disorder. Patient was accompanied by son. Patient is pleasant and engaged in conversation. Reports the following concerns difficulty managing bipolar symptoms. Duration of problem:Since October 2018 when patient stopped taking her medication.   Mental Health/ Substance Hx: Diagnosed with Bipolar disorder at age 34.   Life & Social Patient has 68 year old son. Patient is in school and is currently working.  Recent life changes: none reported. Issues discussed:  previous and current coping skills, community resources ,importance of reestablishing care.     GOALS ADDRESSED:  Patient will: 1. Reestablish care with Mental Health Provider at [facility]  Intervention: Solutions focus strategies, community resources,  Referral to [employer] provider.  Assessment/Plan:  Patient is currently experiencing minimal symptoms of depression based on PHQ-9 score of 8.  Patient may benefit from and is in agreement to  1. Client plans to do a Walk in at [facility] on Friday.  2. [facility] intern will FU with patient in 5-7 days.  [phone] Patient can be contacted at this number   Warm Hand Off Completed.      Audry Riles MSW Social Work Intern [employer] [phone]

## 2018-08-10 NOTE — Progress Notes (Signed)
Jolayne Panther, LCSW, have reviewed all documentation for this visit. The documentation on 08/10/18 for this encounter are all accurate and complete.  Casimer Lanius, LCSW Licensed Clinical Social Worker [facility]   [phone] 2:39 PM

## 2018-08-10 NOTE — Assessment & Plan Note (Signed)
She is not compliant with medication and follow-up for bipolar. [facility] consulted today and they met with her. She will return to [facility] for further management.

## 2018-08-10 NOTE — Patient Instructions (Signed)

## 2018-08-14 ENCOUNTER — Telehealth: Payer: Self-pay | Admitting: Family Medicine

## 2018-08-14 DIAGNOSIS — R87613 High grade squamous intraepithelial lesion on cytologic smear of cervix (HGSIL): Secondary | ICD-10-CM

## 2018-08-14 LAB — CYTOLOGY - PAP: Diagnosis: HIGH — AB

## 2018-08-14 MED ORDER — METRONIDAZOLE 500 MG PO TABS: 2000.0000 mg | ORAL_TABLET | Freq: Once | ORAL | 0 refills | Status: AC

## 2018-08-14 NOTE — Telephone Encounter (Signed)
I was able to reach her on the number provided. She confirmed her DOB, home address and last office visit before I gave her result.  I discussed + HSIL and Trich. Need for treatment. Abstain from alcohol while on Flagyl. Have sexual partner tested and treated. Return in 2-3 weeks for test of cure.   Need referral to Gyn for HSIL. I answered all her questions.

## 2018-08-14 NOTE — Telephone Encounter (Signed)
HIPAA compliant call back message left.   I need to talk to her regarding test result.   Trich positive. HSIL need Gyn referral.

## 2018-08-14 NOTE — Addendum Note (Signed)
Addended by: Andrena Mews T on: 08/14/2018 12:23 PM   Modules accepted: Orders

## 2018-08-14 NOTE — Telephone Encounter (Signed)
Pt returning a call from Dr. Gwendlyn Deutscher. Pt did not listen to message if one was left. Pt requesting Dr. Gwendlyn Deutscher call her again on [phone]. Ottis Stain, CMA

## 2018-08-22 ENCOUNTER — Telehealth: Payer: Self-pay | Admitting: Licensed Clinical Social Worker

## 2018-08-22 NOTE — Progress Notes (Signed)
Type of Service: Clinical Social Work  [facility] intern phone call to follow up with patient to see if she was able to follow up with [employer]. Patient was in school when [facility] intern called. Mother stated that she would leave my number for the patient to call me back. Mother stated the patient is working and also in school and wasn't able to give me a good time to follow up further..    Plan: 1. [facility] intern will wait for a call from client regarding reconnection to [facility].  Audry Riles, [place] intern Behavioral Health Clinician,  [place] [phone]

## 2018-08-24 ENCOUNTER — Encounter: Payer: Self-pay | Admitting: Family Medicine

## 2018-08-24 NOTE — Progress Notes (Signed)
Intern assessed patient's needs and barriers.   We discussed interventions and I concur with the plan. See note 08/22/18.  Casimer Lanius, [place]   [phone] 8:46 AM

## 2018-09-17 ENCOUNTER — Ambulatory Visit: Payer: Medicaid Other | Admitting: Family Medicine

## 2018-09-20 ENCOUNTER — Encounter: Payer: Self-pay | Admitting: Family Medicine

## 2018-09-20 ENCOUNTER — Other Ambulatory Visit (HOSPITAL_COMMUNITY)
Admission: RE | Admit: 2018-09-20 | Discharge: 2018-09-20 | Disposition: A | Discharge status: Home / Self Care | Payer: Medicaid Other | Source: Ambulatory Visit | Attending: Family Medicine | Admitting: Family Medicine

## 2018-09-20 ENCOUNTER — Ambulatory Visit (INDEPENDENT_AMBULATORY_CARE_PROVIDER_SITE_OTHER): Payer: Medicaid Other | Admitting: Family Medicine

## 2018-09-20 VITALS — BP 117/66 | HR 90 | Wt 100.6 lb

## 2018-09-20 DIAGNOSIS — Z3202 Encounter for pregnancy test, result negative: Secondary | ICD-10-CM | POA: Diagnosis not present

## 2018-09-20 DIAGNOSIS — D069 Carcinoma in situ of cervix, unspecified: Secondary | ICD-10-CM | POA: Diagnosis not present

## 2018-09-20 DIAGNOSIS — N871 Moderate cervical dysplasia: Secondary | ICD-10-CM | POA: Diagnosis not present

## 2018-09-20 LAB — POCT PREGNANCY, URINE: Preg Test, Ur: NEGATIVE

## 2018-09-20 NOTE — Patient Instructions (Signed)
Colposcopy, Care After  This sheet gives you information about how to care for yourself after your procedure. Your doctor may also give you more specific instructions. If you have problems or questions, contact your doctor.  What can I expect after the procedure?  If you did not have a tissue sample removed (did not have a biopsy), you may only have some spotting for a few days. You can go back to your normal activities.  If you had a tissue sample removed, it is common to have:  · Soreness and pain. This may last for a few days.  · Light-headedness.  · Mild bleeding from your vagina or dark-colored, grainy discharge from your vagina. This may last for a few days. You may need to wear a sanitary pad.  · Spotting for at least 48 hours after the procedure.    Follow these instructions at home:  · Take over-the-counter and prescription medicines only as told by your doctor. Ask your doctor what medicines you can start taking again. This is very important if you take blood-thinning medicine.  · Do not drive or use heavy machinery while taking prescription pain medicine.  · For 3 days, or as long as your doctor tells you, avoid:  ? Douching.  ? Using tampons.  ? Having sex.  · If you use birth control (contraception), keep using it.  · Limit activity for the first day after the procedure. Ask your doctor what activities are safe for you.  · It is up to you to get the results of your procedure. Ask your doctor when your results will be ready.  · Keep all follow-up visits as told by your doctor. This is important.  Contact a doctor if:  · You get a skin rash.  Get help right away if:  · You are bleeding a lot from your vagina. It is a lot of bleeding if you are using more than one pad an hour for 2 hours in a row.  · You have clumps of blood (blood clots) coming from your vagina.  · You have a fever.  · You have chills  · You have pain in your lower belly (pelvic area).  · You have signs of infection, such as vaginal  discharge that is:  ? Different than usual.  ? Yellow.  ? Bad-smelling.  · You have very pain or cramps in your lower belly that do not get better with medicine.  · You feel light-headed.  · You feel dizzy.  · You pass out (faint).  Summary  · If you did not have a tissue sample removed (did not have a biopsy), you may only have some spotting for a few days. You can go back to your normal activities.  · If you had a tissue sample removed, it is common to have mild pain and spotting for 48 hours.  · For 3 days, or as long as your doctor tells you, avoid douching, using tampons and having sex.  · Get help right away if you have bleeding, very bad pain, or signs of infection.  This information is not intended to replace advice given to you by your health care provider. Make sure you discuss any questions you have with your health care provider.  Document Released: 04/25/2008 Document Revised: 07/27/2016 Document Reviewed: 07/27/2016  Elsevier Interactive Patient Education © 2018 Elsevier Inc.

## 2018-09-20 NOTE — Progress Notes (Signed)
    GYNECOLOGY CLINIC COLPOSCOPY PROCEDURE NOTE  27 y.o. G1P0101 here for colposcopy for high-grade squamous intraepithelial neoplasia  (HGSIL-encompassing moderate and severe dysplasia) pap smear on 08/10/18. Discussed role for HPV in cervical dysplasia, need for surveillance.  Patient given informed consent, signed copy in the chart, time out was performed.  Placed in lithotomy position. Cervix viewed with speculum and colposcope after application of acetic acid.   Colposcopy adequate? Yes  acetowhite lesion(s) noted at entire SCJ and abnormal vessels noted at 10 o'clock; corresponding biopsies obtained.  ECC specimen obtained. All specimens were labeled and sent to pathology.   Patient was given post procedure instructions.  Will follow up pathology and manage accordingly; patient will be contacted with results and recommendations.  Routine preventative health maintenance measures emphasized.   Kaitlyn Hunt 09/20/2018 4:02 PM

## 2018-09-26 ENCOUNTER — Telehealth: Payer: Self-pay | Admitting: *Deleted

## 2018-09-26 ENCOUNTER — Encounter: Payer: Self-pay | Admitting: *Deleted

## 2018-09-26 NOTE — Telephone Encounter (Signed)
Pt calls because she has started her period this am and had a "black tissue like clump come out of me this am". She is having pain in back.  Upon reviewing chart, pt had a colposcopy on Thursday. Explained that the black was probably a mixture of dried blood from biopsy and the solution used to help blood clot. Pt expresses understanding but would appreciate if I "ran it by Dr. Gwendlyn Deutscher to make sure".  Advised I would send message to MD, but would like to make an appt for tomorrow just in case (mostly because pt is so anxious). Also advised to take ibuprofen to help with the pain.   Advised we could cancel the appt after Dr. Gwendlyn Deutscher spoke with her.  Will forward to MD. Braidon Chermak, [place] Spotted, [place]

## 2018-09-26 NOTE — Telephone Encounter (Signed)
I spoke with the patient. She endorsed heavy bleed and clot since she had colposcopy last week. I advised her that this is uncommon post colposcopy. However, if this persists, she should contact [place] office for evaluation or MAU if it worsen. She agreed with the plan.

## 2018-09-27 ENCOUNTER — Encounter: Payer: Self-pay | Admitting: Family Medicine

## 2018-09-27 ENCOUNTER — Other Ambulatory Visit: Payer: Self-pay

## 2018-09-27 ENCOUNTER — Ambulatory Visit (INDEPENDENT_AMBULATORY_CARE_PROVIDER_SITE_OTHER): Payer: Medicaid Other | Admitting: Family Medicine

## 2018-09-27 VITALS — BP 98/62 | HR 59 | Temp 97.5°F | Wt 101.0 lb

## 2018-09-27 DIAGNOSIS — R87613 High grade squamous intraepithelial lesion on cytologic smear of cervix (HGSIL): Secondary | ICD-10-CM | POA: Diagnosis not present

## 2018-09-27 DIAGNOSIS — N898 Other specified noninflammatory disorders of vagina: Secondary | ICD-10-CM | POA: Diagnosis not present

## 2018-09-27 LAB — POCT URINE PREGNANCY: Preg Test, Ur: NEGATIVE

## 2018-09-27 NOTE — Patient Instructions (Signed)
LEEP (Loop Electrosurgical Excision Procedure) What is LEEP? LEEP stands for loop electrosurgical (ee-lektro-SIR-jeh-kal) excision (ex-SIH-zhun) procedure. Your doctor may recommend LEEP if your Pap test or biopsy results show that you have abnormal cells on your cervix (the lower end of your uterus). It is an outpatient procedure, so you will not stay in the hospital overnight. It usually is done at your doctor's office.  LEEP is an effective treatment for abnormal cell growth, called dysplasia (dis-PLAY-zha). Dysplasia is not cancer. But it could lead to cervical cancer if you don't get treatment. LEEP removes the abnormal tissue from your cervix. Some types of treatment destroy the abnormal tissue. LEEP preserves the tissue, so it can be checked to make sure no cancer is present.  How is LEEP done? LEEP is done when you do not have your period. This lets the doctor see your cervix clearly. Before the procedure, you will be asked to sign a consent form. Your doctor may give you medicine to prevent cramping.     During the procedure, your doctor will: Put local anesthesia into your cervix to numb it Look at your cervix with a special magnifier, called a colposcope (KOL-poh-skope) Remove the abnormal cells from your cervix. To do this, the doctor will use a thin, wire loop with an electrical current. Because the wire is so thin, there is very little damage to surrounding tissue. Put a medicated paste on your cervix to help prevent bleeding During the procedure, you may have some mild cramping. This cramping may continue for a little while after the procedure.  The procedure usually takes 20-30 minutes to complete.

## 2018-09-27 NOTE — Progress Notes (Signed)
    CHIEF COMPLAINT / HPI: Abnormal vaginal discharge after colposcopy done at outside clinic.  She brings in pictures of the discharge.  She also had a lot of cramping and is beginning of her cycle which was due immediately after the procedure so she is essentially on schedule for her menstrual cycle.  She is scheduled for a LEEP.  She has some questions.  REVIEW OF SYSTEMS: Vaginal bleeding has slowed down.  Her cramping has resolved.  PERTINENT  PMH / PSH: I have reviewed the patient's medications, allergies, past medical and surgical history, smoking status and updated in the EMR as appropriate. 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 2.  Vaginal discharge.  She showed me the pictures of her vaginal discharge.  Essentially it looks like some dried blood combined with Monsel's paste.  I reassured her.  We went over the reason she is going to have to have the LEEP procedure and I gave her a handout on the LEEP procedure.  We discussed in details being greater than 50% of our 25-minute office visit in counseling education regarding these issues.  She Artie has LEEP scheduled.  She had no further questions.

## 2018-10-15 ENCOUNTER — Encounter: Payer: Self-pay | Admitting: Family Medicine

## 2018-10-16 ENCOUNTER — Encounter: Payer: Self-pay | Admitting: Family Medicine

## 2018-10-25 ENCOUNTER — Encounter: Payer: Self-pay | Admitting: Family Medicine

## 2018-10-25 ENCOUNTER — Other Ambulatory Visit (HOSPITAL_COMMUNITY)
Admission: RE | Admit: 2018-10-25 | Discharge: 2018-10-25 | Disposition: A | Discharge status: Home / Self Care | Payer: Medicaid Other | Source: Ambulatory Visit | Attending: Family Medicine | Admitting: Family Medicine

## 2018-10-25 ENCOUNTER — Ambulatory Visit (INDEPENDENT_AMBULATORY_CARE_PROVIDER_SITE_OTHER): Payer: Medicaid Other | Admitting: Family Medicine

## 2018-10-25 VITALS — BP 132/69 | HR 64 | Ht 63.0 in | Wt 98.6 lb

## 2018-10-25 DIAGNOSIS — Z3202 Encounter for pregnancy test, result negative: Secondary | ICD-10-CM | POA: Diagnosis not present

## 2018-10-25 DIAGNOSIS — Z9889 Other specified postprocedural states: Secondary | ICD-10-CM

## 2018-10-25 DIAGNOSIS — D069 Carcinoma in situ of cervix, unspecified: Secondary | ICD-10-CM

## 2018-10-25 HISTORY — DX: Other specified postprocedural states: Z98.890

## 2018-10-25 LAB — POCT PREGNANCY, URINE: PREG TEST UR: NEGATIVE

## 2018-10-25 NOTE — Patient Instructions (Signed)
Cervical Conization, Care After °This sheet gives you information about how to care for yourself after your procedure. Your doctor may also give you more specific instructions. If you have problems or questions, contact your doctor. °Follow these instructions at home: °Medicines °· Take over-the-counter and prescription medicines only as told by your doctor. °· Do not take aspirin until your doctor says it is okay. °· If you take pain medicine: °? You may have constipation. To help treat this, your doctor may tell you to: °§ Drink enough fluid to keep your pee (urine) clear or pale yellow. °§ Take medicines. °§ Eat foods that are high in fiber. These include fresh fruits and vegetables, whole grains, bran, and beans. °§ Limit foods that are high in fat and sugar. These include fried foods and sweet foods. °? Do not drive or use heavy machines. °General instructions °· You can eat your usual diet unless your doctor tells you not to do so. °· Take showers for the first week. Do not take baths, swim, or use hot tubs until your doctor says it is okay. °· Do not douche, use tampons, or have sex until your doctor says it is okay. °· For 7-14 days after your procedure, avoid: °? Being very active. °? Exercising. °? Heavy lifting. °· Keep all follow-up visits as told by your doctor. This is important. °Contact a doctor if: °· You have a rash. °· You are dizzy or lightheaded. °· You feel sick to your stomach (nauseous). °· You throw up (vomit). °· You have fluid from your vagina (vaginal discharge) that smells bad. °Get help right away if: °· There are blood clots coming from your vagina. °· You have more bleeding than you would have in a normal period. For example, you soak a pad in less than 1 hour. °· You have a fever. °· You have more and more cramps. °· You pass out (faint). °· You have pain when peeing. °· Your have a lot of pain. °· Your pain gets worse. °· Your pain does not get better when you take your  medicine. °· You have blood in your pee. °· You throw up (vomit). °Summary °· After your procedure, take over-the-counter and prescription medicines only as told by your doctor. °· Do not douche, use tampons, or have sex until your doctor says it is okay. °· For about 7-14 days after your procedure, try not to exercise or lift heavy objects. °· Get help right away if you have new symptoms, or if your symptoms become worse. °This information is not intended to replace advice given to you by your health care provider. Make sure you discuss any questions you have with your health care provider. °Document Released: 08/16/2008 Document Revised: 11/09/2016 Document Reviewed: 11/09/2016 °Elsevier Interactive Patient Education © 2017 Elsevier Inc. ° °

## 2018-10-26 NOTE — Progress Notes (Signed)
   GYNECOLOGY OFFICE PROCEDURE NOTE @NAMEBYAGE @ is a 27 y.o. G1P0101 here for LEEP. No GYN concerns. Pap smear and colposcopy history reviewed.    Pap HGSIL Colpo Biopsy CIN 2-3 ECC neg  Risks, benefits, alternatives, and limitations of procedure explained to patient, including pain, bleeding, infection, failure to remove abnormal tissue and failure to cure dysplasia, need for repeat procedures, damage to pelvic organs, cervical incompetence.  Role of HPV,cervical dysplasia and need for close followup was empasized. Informed written consent was obtained. All questions were answered. Time out performed. Urine pregnancy test was negative.  ??Procedure: The patient was placed in lithotomy position and the bivalved coated speculum was placed in the patient's vagina. A grounding pad placed on the patient. Local anesthesia was administered via an intracervical block using 20 ml of 2% Lidocaine with epinephrine. Colposcopy performed. The suction was turned on and the Small 1X Fisher Cone Biopsy Excisor on 75 Watts of blended current was used to excise the entire transformation zone. Excellent hemostasis was achieved using roller ball coagulation set at 50 Watts coagulation current. Monsel's solution was then applied and the speculum was removed from the vagina. Specimens were sent to pathology.  ?The patient tolerated the procedure well. Post-operative instructions given to patient, including instruction to seek medical attention for persistent bright red bleeding, fever, abdominal/pelvic pain, dysuria, nausea or vomiting. She was also told about the possibility of having copious yellow to black tinged discharge for weeks. She was counseled to avoid anything in the vagina (sex/douching/tampons) for 3 weeks.   Donnamae Jude, MD 10/26/2018 11:49 AM

## 2018-10-29 ENCOUNTER — Encounter: Payer: Self-pay | Admitting: Family Medicine

## 2018-11-21 HISTORY — DX: Maternal care for unspecified type scar from previous cesarean delivery: O34.219

## 2018-12-14 ENCOUNTER — Other Ambulatory Visit: Payer: Self-pay

## 2018-12-14 ENCOUNTER — Encounter: Payer: Self-pay | Admitting: Family Medicine

## 2018-12-14 ENCOUNTER — Ambulatory Visit (INDEPENDENT_AMBULATORY_CARE_PROVIDER_SITE_OTHER): Payer: Medicaid Other | Admitting: Family Medicine

## 2018-12-14 VITALS — BP 102/60 | Temp 98.4°F | Wt 101.0 lb

## 2018-12-14 DIAGNOSIS — Z3201 Encounter for pregnancy test, result positive: Secondary | ICD-10-CM

## 2018-12-14 DIAGNOSIS — F3170 Bipolar disorder, currently in remission, most recent episode unspecified: Secondary | ICD-10-CM | POA: Diagnosis not present

## 2018-12-14 DIAGNOSIS — O209 Hemorrhage in early pregnancy, unspecified: Secondary | ICD-10-CM

## 2018-12-14 DIAGNOSIS — O0990 Supervision of high risk pregnancy, unspecified, unspecified trimester: Secondary | ICD-10-CM | POA: Insufficient documentation

## 2018-12-14 DIAGNOSIS — E079 Disorder of thyroid, unspecified: Secondary | ICD-10-CM | POA: Diagnosis not present

## 2018-12-14 DIAGNOSIS — O0991 Supervision of high risk pregnancy, unspecified, first trimester: Secondary | ICD-10-CM

## 2018-12-14 DIAGNOSIS — E0590 Thyrotoxicosis, unspecified without thyrotoxic crisis or storm: Secondary | ICD-10-CM

## 2018-12-14 DIAGNOSIS — Z3200 Encounter for pregnancy test, result unknown: Secondary | ICD-10-CM

## 2018-12-14 DIAGNOSIS — Z331 Pregnant state, incidental: Secondary | ICD-10-CM

## 2018-12-14 HISTORY — DX: Supervision of high risk pregnancy, unspecified, unspecified trimester: O09.90

## 2018-12-14 LAB — POCT URINE PREGNANCY: PREG TEST UR: POSITIVE — AB

## 2018-12-14 NOTE — Progress Notes (Signed)
  Subjective:     Patient ID: Kaitlyn Hunt, female   DOB: 24-May-1991, 28 y.o.   MRN: [id number]  HPI Amenorrhea: LMP 12/17/18, denies N/V, she endorsed some abdominal cramping here and there. Denies any change in her bowel habit. She had a positive home pregnancy test and will like to get it confirmed today. Not on birth control due to side effects. She was spotting few days ago for two days, hence she decided to get pregnancy test done. Hyperthyroidism: Not on meds. Here for f/u.  Current Outpatient Medications on File Prior to Visit  Medication Sig Dispense Refill  . QUEtiapine (SEROQUEL) 100 MG tablet Take 1 tablet (100 mg total) by mouth at bedtime. 30 tablet 0   No current facility-administered medications on file prior to visit.    Past Medical History:  Diagnosis Date  . Amenorrhea 09/23/2014  . 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 ATTENTION DEFICIT, W/HYPERACTIVITY 01/18/2007   Qualifier: Diagnosis of  By: Hoy Morn MD, HEIDI    . Bipolar 1 disorder ([place])    previously on Depakote D/C 5/11 bc pt stated it made her feel lazy   . Eclampsia in pregnancy   . 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 Low TSH level 04/27/2015  . Ovarian mass, right 02/04/2016  . PROBLEMS, BEHAVIORAL NEC 08/03/2007   Qualifier: Diagnosis of  By: Hoy Morn MD, HEIDI     Vitals:   12/14/18 0921  BP: 102/60  Temp: 98.4 F (36.9 C)  TempSrc: Oral  Weight: 101 lb (45.8 kg)     Office Visit from 12/14/2018 in [place]  PHQ-9 Total Score  2      Review of Systems  Respiratory: Negative.   Cardiovascular: Negative.   Gastrointestinal: Negative.   Genitourinary: Positive for menstrual problem.       Might be pregnant  Musculoskeletal: Negative.   Psychiatric/Behavioral: Negative.   All other systems reviewed and are negative.      Objective:   Physical Exam Vitals signs and nursing note reviewed.  Constitutional:      Appearance: She is not  ill-appearing.  Neck:     Musculoskeletal: Normal range of motion.  Cardiovascular:     Rate and Rhythm: Normal rate and regular rhythm.     Pulses: Normal pulses.     Heart sounds: Normal heart sounds. No murmur.  Pulmonary:     Effort: Pulmonary effort is normal. No respiratory distress.     Breath sounds: Normal breath sounds. No wheezing or rhonchi.  Abdominal:     General: Abdomen is flat. Bowel sounds are normal. There is no distension.     Palpations: There is no mass.     Tenderness: There is no abdominal tenderness. There is no guarding.     Hernia: No hernia is present.  Musculoskeletal: Normal range of motion.  Neurological:     Mental Status: She is alert.        Assessment:     Incidental pregnancy Spotting in pregancy Hyperthyroidism    Plan:     Check problem list.

## 2018-12-14 NOTE — Patient Instructions (Signed)
Congratulation on being pregnant. I am happy for you. I appreciate the fact that you want to talk to your son about the pregnancy before making any decision. In the meantime schedule prenatal intake appointment on your way out today. I will likely refer you to high risk clinic for pregnancy management. Start your prenatal vitamin.

## 2018-12-14 NOTE — Assessment & Plan Note (Addendum)
Upreg positive (4weeks 0 days GA today). She has not decided whether she wants to keep the baby or not. She will decide after she talks with her son. In the meantime she will like to start caring for her pregnancy. Start PNV. I will refer to [facility] due to hx of Thyroid disease and recent LEEP procedure. In the meantime, while awaiting referral, she can be seen in our clinic. Pelvic US ordered for spotting in pregnancy. ED precaution if spotting reoccur.

## 2018-12-14 NOTE — Assessment & Plan Note (Signed)
Stable off meds.   Office Visit from 12/14/2018 in [place]  PHQ-9 Total Score  2     Had been told on multiple occasions to f/u with [facility].

## 2018-12-14 NOTE — Assessment & Plan Note (Signed)
Recent labs has been fine. Repeat TFT today. Consider Endo referral per TFT result.

## 2018-12-17 ENCOUNTER — Inpatient Hospital Stay (HOSPITAL_COMMUNITY): Payer: Medicaid Other

## 2018-12-17 ENCOUNTER — Encounter (HOSPITAL_COMMUNITY): Payer: Self-pay | Admitting: *Deleted

## 2018-12-17 ENCOUNTER — Encounter: Payer: Self-pay | Admitting: Family Medicine

## 2018-12-17 ENCOUNTER — Other Ambulatory Visit: Payer: Self-pay

## 2018-12-17 ENCOUNTER — Inpatient Hospital Stay (HOSPITAL_COMMUNITY)
Admission: AD | Admit: 2018-12-17 | Discharge: 2018-12-17 | Disposition: A | Discharge status: Home / Self Care | Payer: Medicaid Other | Attending: Obstetrics and Gynecology | Admitting: Obstetrics and Gynecology

## 2018-12-17 DIAGNOSIS — Z3A01 Less than 8 weeks gestation of pregnancy: Secondary | ICD-10-CM | POA: Insufficient documentation

## 2018-12-17 DIAGNOSIS — O208 Other hemorrhage in early pregnancy: Secondary | ICD-10-CM | POA: Insufficient documentation

## 2018-12-17 DIAGNOSIS — O418X10 Other specified disorders of amniotic fluid and membranes, first trimester, not applicable or unspecified: Secondary | ICD-10-CM

## 2018-12-17 DIAGNOSIS — N898 Other specified noninflammatory disorders of vagina: Secondary | ICD-10-CM | POA: Diagnosis not present

## 2018-12-17 DIAGNOSIS — Z87891 Personal history of nicotine dependence: Secondary | ICD-10-CM | POA: Insufficient documentation

## 2018-12-17 DIAGNOSIS — O26891 Other specified pregnancy related conditions, first trimester: Secondary | ICD-10-CM | POA: Diagnosis not present

## 2018-12-17 DIAGNOSIS — R1030 Lower abdominal pain, unspecified: Secondary | ICD-10-CM | POA: Diagnosis present

## 2018-12-17 DIAGNOSIS — O209 Hemorrhage in early pregnancy, unspecified: Secondary | ICD-10-CM

## 2018-12-17 DIAGNOSIS — O468X1 Other antepartum hemorrhage, first trimester: Secondary | ICD-10-CM

## 2018-12-17 HISTORY — DX: Trichomoniasis, unspecified: A59.9

## 2018-12-17 HISTORY — DX: Gestational (pregnancy-induced) hypertension without significant proteinuria, unspecified trimester: O13.9

## 2018-12-17 LAB — URINALYSIS, ROUTINE W REFLEX MICROSCOPIC

## 2018-12-17 LAB — CBC
HCT: 37.2 % (ref 36.0–46.0)
Hemoglobin: 12.5 g/dL (ref 12.0–15.0)
MCH: 31 pg (ref 26.0–34.0)
MCHC: 33.6 g/dL (ref 30.0–36.0)
MCV: 92.3 fL (ref 80.0–100.0)
Platelets: 257 10*3/uL (ref 150–400)
RBC: 4.03 MIL/uL (ref 3.87–5.11)
RDW: 12.9 % (ref 11.5–15.5)
WBC: 9.7 10*3/uL (ref 4.0–10.5)
nRBC: 0 % (ref 0.0–0.2)

## 2018-12-17 LAB — URINALYSIS, MICROSCOPIC (REFLEX)

## 2018-12-17 LAB — WET PREP, GENITAL
Clue Cells Wet Prep HPF POC: NONE SEEN
Sperm: NONE SEEN
Trich, Wet Prep: NONE SEEN
Yeast Wet Prep HPF POC: NONE SEEN

## 2018-12-17 LAB — HCG, QUANTITATIVE, PREGNANCY: hCG, Beta Chain, Quant, S: 3276 m[IU]/mL — ABNORMAL HIGH (ref <5)

## 2018-12-17 LAB — ABO/RH: ABO/RH(D): O POS

## 2018-12-17 NOTE — MAU Note (Signed)
Pt has labs scheduled for tomorrow and ultrasound scheduled for Wednesday outpatient.

## 2018-12-17 NOTE — Progress Notes (Signed)
Thyroid function test looks fine so far. She had abnormal test in the past suggestive of hyperthyroidism. Unlikely Euthyroid sick syndrome since she was not sick at the time.   I will still prefer [facility] referral for prenatal care given hx of Bipolar and recent LEEP treatment which may slightly increase risk for cervical incompetence. Thyroid function might become abnormal as pregnancy progressive, hence she will need close monitoring.  OB may send her back to our clinic if they feel this is an inappropriate referral.  She will see [place] in our clinic till her OB referral process is complete.

## 2018-12-17 NOTE — Discharge Instructions (Signed)
Your ultrasound showed part of a pregnancy in the uterus but it is too early to determine viability. I have scheduled you for a lab visit on Wednesday at 1:30 pm. If your pregnancy hormones are rising appropriately, you will be scheduled for a repeat ultrasound in 10-14 days. Your ultrasound did show a subchorionic hemorrhage, you can read more about that below. Your cervix was closed so you are not having a miscarriage at this point. We took swabs to look for infections as well.   Subchorionic Hematoma  A subchorionic hematoma is a gathering of blood between the outer wall of the embryo (chorion) and the inner wall of the womb (uterus). This condition can cause vaginal bleeding. If they cause little or no vaginal bleeding, early small hematomas usually shrink on their own and do not affect your baby or pregnancy. When bleeding starts later in pregnancy, or if the hematoma is larger or occurs in older pregnant women, the condition may be more serious. Larger hematomas may get bigger, which increases the chances of miscarriage. This condition also increases the risk of:  Premature separation of the placenta from the uterus.  Premature (preterm) labor.  Stillbirth. What are the causes? The exact cause of this condition is not known. It occurs when blood is trapped between the placenta and the uterine wall because the placenta has separated from the original site of implantation. What increases the risk? You are more likely to develop this condition if:  You were treated with fertility medicines.  You conceived through in vitro fertilization (IVF). What are the signs or symptoms? Symptoms of this condition include:  Vaginal spotting or bleeding.  Contractions of the uterus. These cause abdominal pain. Sometimes you may have no symptoms and the bleeding may only be seen when ultrasound images are taken (transvaginal ultrasound). How is this diagnosed? This condition is diagnosed based on a  physical exam. This includes a pelvic exam. You may also have other tests, including:  Blood tests.  Urine tests.  Ultrasound of the abdomen. How is this treated? Treatment for this condition can vary. Treatment may include:  Watchful waiting. You will be monitored closely for any changes in bleeding. During this stage: ? The hematoma may be reabsorbed by the body. ? The hematoma may separate the fluid-filled space containing the embryo (gestational sac) from the wall of the womb (endometrium).  Medicines.  Activity restriction. This may be needed until the bleeding stops. Follow these instructions at home:  Stay on bed rest if told to do so by your health care provider.  Do not lift anything that is heavier than 10 lbs. (4.5 kg) or as told by your health care provider.  Do not use any products that contain nicotine or tobacco, such as cigarettes and e-cigarettes. If you need help quitting, ask your health care provider.  Track and write down the number of pads you use each day and how soaked (saturated) they are.  Do not use tampons.  Keep all follow-up visits as told by your health care provider. This is important. Your health care provider may ask you to have follow-up blood tests or ultrasound tests or both. Contact a health care provider if:  You have any vaginal bleeding.  You have a fever. Get help right away if:  You have severe cramps in your stomach, back, abdomen, or pelvis.  You pass large clots or tissue. Save any tissue for your health care provider to look at.  You have more vaginal  bleeding, and you faint or become lightheaded or weak. Summary  A subchorionic hematoma is a gathering of blood between the outer wall of the placenta and the uterus.  This condition can cause vaginal bleeding.  Sometimes you may have no symptoms and the bleeding may only be seen when ultrasound images are taken.  Treatment may include watchful waiting, medicines, or  activity restriction. This information is not intended to replace advice given to you by your health care provider. Make sure you discuss any questions you have with your health care provider. Document Released: 02/22/2007 Document Revised: 01/03/2017 Document Reviewed: 01/03/2017 Elsevier Interactive Patient Education  2019 [employer].

## 2018-12-17 NOTE — MAU Note (Signed)
Presents with c/o lower abdominal cramping that began this morning.  Also reports "red" VB that began this morning.  +UPT 12/14/18.  LMP 11/16/18

## 2018-12-17 NOTE — MAU Provider Note (Signed)
History     CSN: [id number]  Arrival date and time: 12/17/18 8099   First Provider Initiated Contact with Patient 12/17/18 1021      Chief Complaint  Patient presents with  . Cramping  . Vaginal Bleeding   HPI   Kaitlyn Hunt is 28 y.o. G18P0101 female at [redacted]w[redacted]d by LMP who presents for lower abdominal cramping that began this morning as well as bright red spotting. Had a +UPT on 1/24. Lower abdominal pain has diffuse across both quadrants and not localized. Has been constant for most of the morning. Reports had some abnormal vaginal discharge prior to onset of bleeding. Denies vomiting or fevers. Denies lightheadedness.   OB History    Gravida  2   Para  1   Term      Preterm  1   AB      Living  1     SAB      TAB      Ectopic      Multiple      Live Births              Past Medical History:  Diagnosis Date  . Amenorrhea 09/23/2014  . 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 ATTENTION DEFICIT, W/HYPERACTIVITY 01/18/2007   Qualifier: Diagnosis of  By: Hoy Morn MD, HEIDI    . Bipolar 1 disorder ([place])    previously on Depakote D/C 5/11 bc pt stated it made her feel lazy   . Eclampsia in pregnancy   . 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 Low TSH level 04/27/2015  . Ovarian mass, right 02/04/2016  . Pregnancy induced hypertension   . PROBLEMS, BEHAVIORAL NEC 08/03/2007   Qualifier: Diagnosis of  By: Hoy Morn MD, HEIDI    . Trichimoniasis     Past Surgical History:  Procedure Laterality Date  . CESAREAN SECTION    . WISDOM TOOTH EXTRACTION      Family History  Problem Relation Age of Onset  . Bipolar disorder Mother   . Bipolar disorder Maternal Aunt   . Bipolar disorder Sister     Social History   Tobacco Use  . Smoking status: Former Smoker    Packs/day: 0.50    Types: Cigarettes  . Smokeless tobacco: Never Used  Substance Use Topics  . Alcohol use: Not Currently  . Drug use: No    Allergies: No Known  Allergies  Medications Prior to Admission  Medication Sig Dispense Refill Last Dose  . QUEtiapine (SEROQUEL) 100 MG tablet Take 1 tablet (100 mg total) by mouth at bedtime. (Patient not taking: Reported on 12/14/2018) 30 tablet 0 Not Taking    Review of Systems  Constitutional: Negative for activity change, appetite change, chills and fever.  Respiratory: Negative for chest tightness and shortness of breath.   Cardiovascular: Negative for chest pain.  Gastrointestinal: Positive for abdominal pain. Negative for constipation, diarrhea, nausea and vomiting.  Genitourinary: Positive for vaginal bleeding and vaginal discharge. Negative for dysuria, frequency and pelvic pain.  Neurological: Negative for dizziness, syncope, weakness and light-headedness.   Physical Exam   Blood pressure 119/63, pulse 92, temperature (!) 97.5 F (36.4 C), temperature source Oral, resp. rate 20, height 5\' 3"  (1.6 m), weight 46.5 kg, last menstrual period 11/16/2018, SpO2 100 %.  Physical Exam  Constitutional: She is oriented to person, place, and time. She appears well-developed and well-nourished. No distress.  HENT:  Head: Normocephalic and atraumatic.  Eyes: Conjunctivae and EOM are normal.  Neck: Normal range of motion. Neck supple.  Cardiovascular: Normal rate, regular rhythm and normal heart sounds.  Respiratory: Effort normal and breath sounds normal. No respiratory distress.  GI: Soft. Bowel sounds are normal. She exhibits no distension. There is no abdominal tenderness. There is no rebound and no guarding.  Genitourinary:    Genitourinary Comments: Vagina with mild amount of bleeding present. No abnormal discharge or tissue noted in vaginal vault. Cervix appears normal, non-friable and visually closed. Cervix is 0/thick/firm.    Musculoskeletal: Normal range of motion.        General: No edema.  Neurological: She is alert and oriented to person, place, and time.  Skin: Skin is warm and dry. She is  not diaphoretic.  Psychiatric: She has a normal mood and affect. Her behavior is normal.    MAU Course  Procedures  MDM Given presentation of abdominal pain with early pregnancy without prior confirmation of IUP, will obtain ultrasound. Will also obtain ABO/Rh, CBC, and quant bHCG. Cervical exam completed which was significant for closed, thick cervix. Cultures and wet prep obtained.   US Ob Less Than 14 Weeks With Ob Transvaginal  Result Date: 12/17/2018 CLINICAL DATA:  Vaginal bleeding in early pregnancy, cramping in lower abdomen; EGA [redacted] weeks 3 days by LMP of 11/16/2018 EXAM: OBSTETRIC <14 WK [place] AND TRANSVAGINAL OB US TECHNIQUE: Both transabdominal and transvaginal ultrasound examinations were performed for complete evaluation of the gestation as well as the maternal uterus, adnexal regions, and pelvic cul-de-sac. Transvaginal technique was performed to assess early pregnancy. COMPARISON:  None FINDINGS: Intrauterine gestational sac: Present, single, small Yolk sac:  Not visualized Embryo:  Not visualized Cardiac Activity: N/A Heart Rate: N/A  bpm MSD: 3.6 mm   5 w   0 d Subchorionic hemorrhage:  Moderate subchorionic hemorrhage Maternal uterus/adnexae: RIGHT ovary measures 3.4 x 2.2 x 2.2 cm and contains a corpus luteum. LEFT ovary normal size and morphology, 3.3 x 1.3 x 1.7 cm. Small amount of free pelvic fluid. No adnexal masses. IMPRESSION: Tiny intrauterine gestational sac estimated 5 weeks 0 days EGA by mean sac diameter. No yolk sac or fetal pole identified. Moderate subchorionic hemorrhage. Small amount of nonspecific free pelvic fluid. Electronically Signed   By: Lavonia Dana M.D.   On: 12/17/2018 11:38     Assessment and Plan   1. Subchorionic hematoma in first trimester, single or unspecified fetus   2. Vaginal bleeding affecting early pregnancy   3. Vaginal discharge during pregnancy in first trimester    Discussed results with patients. Ectopic pregnancy less likely given  intrauterine gestational sac; however unable to visualize yolk sac or embryo. Have scheduled patient for repeat b-hcg in 48 hours. If continues to rise appropriately, would anticipate performing repeat sono for viability. Patient hemodynamically stable for discharge. Cultures pending. Discussed with patient that bleeding is likely related to subchorionic hemorrhage. Discussed that this can be reabsorbed but can also increae likelihood of early SAB. Strict return precautions reviewed.   Melina Schools 12/17/2018, 10:24 AM

## 2018-12-18 ENCOUNTER — Other Ambulatory Visit: Payer: Medicaid Other

## 2018-12-18 LAB — GC/CHLAMYDIA PROBE AMP (~~LOC~~) NOT AT ARMC
Chlamydia: NEGATIVE
Neisseria Gonorrhea: NEGATIVE

## 2018-12-19 ENCOUNTER — Other Ambulatory Visit (INDEPENDENT_AMBULATORY_CARE_PROVIDER_SITE_OTHER): Payer: Medicaid Other

## 2018-12-19 ENCOUNTER — Ambulatory Visit (HOSPITAL_COMMUNITY): Payer: Medicaid Other

## 2018-12-19 DIAGNOSIS — O3680X0 Pregnancy with inconclusive fetal viability, not applicable or unspecified: Secondary | ICD-10-CM

## 2018-12-19 LAB — HCG, QUANTITATIVE, PREGNANCY: hCG, Beta Chain, Quant, S: 6618 m[IU]/mL — ABNORMAL HIGH (ref <5)

## 2018-12-19 NOTE — Progress Notes (Signed)
Pt here today for STAT Beta Lab.   Pt denies any pain or bleeding.  Pt advised that she will be here for at least two hours for results and f/u.  Pt agreed.   Pt notified Dr. Harolyn Rutherford pt's results.  Provider recommendation to have OB US in ten days.  OB US scheduled for February 10th @ 0900.  Pt notified of providers recommendation and [place] appt.  Pt stated understanding with no further questions.

## 2018-12-19 NOTE — Progress Notes (Signed)
I have reviewed the chart and agree with nursing staff's documentation of this patient's encounter.  Verita Schneiders, MD 12/19/2018 4:31 PM

## 2018-12-20 LAB — TSH+T3+FREE T4+T3 FREE
Free T-3: 3.2 pg/mL
Free T4 by Dialysis: 1.3 ng/dL
TRIIODOTHYRONINE (T-3), SERUM: 111 ng/dL
TSH-ICMA: 1 uU/mL

## 2018-12-22 ENCOUNTER — Encounter: Payer: Self-pay | Admitting: Family Medicine

## 2018-12-25 ENCOUNTER — Encounter: Payer: Self-pay | Admitting: Family Medicine

## 2018-12-25 ENCOUNTER — Ambulatory Visit (INDEPENDENT_AMBULATORY_CARE_PROVIDER_SITE_OTHER): Payer: Medicaid Other | Admitting: Family Medicine

## 2018-12-25 ENCOUNTER — Telehealth: Payer: Self-pay | Admitting: *Deleted

## 2018-12-25 VITALS — BP 116/68 | HR 95 | Temp 97.6°F | Wt 101.1 lb

## 2018-12-25 DIAGNOSIS — Z3A01 Less than 8 weeks gestation of pregnancy: Secondary | ICD-10-CM | POA: Diagnosis not present

## 2018-12-25 DIAGNOSIS — N898 Other specified noninflammatory disorders of vagina: Secondary | ICD-10-CM

## 2018-12-25 DIAGNOSIS — O0991 Supervision of high risk pregnancy, unspecified, first trimester: Secondary | ICD-10-CM | POA: Diagnosis not present

## 2018-12-25 LAB — POCT WET PREP (WET MOUNT): CLUE CELLS WET PREP WHIFF POC: POSITIVE

## 2018-12-25 MED ORDER — METRONIDAZOLE 500 MG PO TABS: 500.0000 mg | ORAL_TABLET | Freq: Two times a day (BID) | ORAL | 0 refills | Status: AC

## 2018-12-25 MED ORDER — MICONAZOLE NITRATE 4 % VA CREA: 1.0000 | TOPICAL_CREAM | Freq: Every day | VAGINAL | 0 refills | Status: AC

## 2018-12-25 MED ORDER — PRENATAL ADULT GUMMY/DHA/FA 0.4-25 MG PO CHEW: 0.4000 mg | CHEWABLE_TABLET | Freq: Every day | ORAL | 8 refills | Status: DC

## 2018-12-25 NOTE — Progress Notes (Addendum)
Kaitlyn Hunt is a 28 y.o. yo G2P0101 at [redacted]w[redacted]d who presents for her initial prenatal visit.  Patient has light pink blood when she wipes and continues to have cramping.  She also reports abnormal vaginal discharge and a bump on the inside of her left labia majora. Pregnancy is not planned She reports breast tenderness and headache. She  is not taking PNV, saying that this is because Dr. Gwendlyn Deutscher did not prescribe them for her. See flow sheet for details.  PMH, POBH, FH, meds, allergies and Social Hx reviewed. Reports preeclampsia in her previous pregnancy.  Prenatal Exam: Gen: Thin but well developed.  No distress.  Vitals noted. HEENT: Normocephalic, atraumatic.  Neck supple without cervical lymphadenopathy, thyromegaly or thyroid nodules.  Fair dentition. CV: RRR no murmur, gallops or rubs Lungs: CTAB.  Normal respiratory effort without wheezes or rales. Abd: soft, NTND. +BS.  Uterus not appreciated above pelvis. GU: Normal external female genitalia without lesions.  Normal vaginal, well rugated without lesions.  White, clumpy vaginal discharge with small, mobile nodule palpated on posterior left labia majora without erythema or drainage noted.  Bimanual exam: No adnexal mass or TTP. No CMT.  Uterus size well below umbilicus Ext: No clubbing, cyanosis or edema. Psych: Normal grooming and dress.  Not depressed or anxious appearing.  Normal thought content and process without flight of ideas or looseness of associations.  Assessment & Plan: 1) 28 y.o. yo G2P0101 at [redacted]w[redacted]d via early ultrasound doing well.  Will need repeat ultrasound to visualize embryo, which is scheduled for February 10, and patient is aware. Current pregnancy issues include LEEP prior to pregnancy and hyperthyroidism, making pregnancy high risk. Dating is reliable. Prenatal labs reviewed and were negative for abnormalities. HIV, RPR, rubella, hepatitis B surface antigen, and sickle cell/thalassemia screens done today.   Urine culture will need to be done at her next visit, since this was missed today.  Urine was collected at the MAU on 1/27, but urine culture was not performed at that time.  Also needs an antibody screen, since this has not been done. Genetic screening offered: Patient is interested in integrated screening. Early glucola is not indicated.  PHQ-9 and Pregnancy Medical Home forms completed and reviewed.  Vaginal discharge: Wet prep collected and was positive for trichomonas, bacterial vaginosis, and yeast.  She was counseled that all of her partners need to be treated or she will continue to get infected with trichomonas.  Also encouraged to use condoms throughout her pregnancy, since a trichomonas infection can cause premature rupture membranes and premature labor.  She was prescribed metronidazole 500 mg twice daily for 7 days, which should treat the bacterial vaginosis as well.  She was also prescribed miconazole vaginal cream to use for her yeast infection.    Bleeding and pain precautions reviewed. Importance of prenatal vitamins reviewed.  Follow up in 4 weeks with our clinic and unless she is contacted by high risk OB for an appointment with them first.

## 2018-12-25 NOTE — Patient Instructions (Addendum)
It was nice meeting you today Ms. Kaitlyn Hunt!  Today, we are doing a wet prep and getting some normal prenatal labs.  We will let you know if any of these results are not normal.  Your due date is September 28 according to your last ultrasound.  I would like for you to come back in 4 weeks if you have not heard from the high risk OB clinic yet, and we will schedule your integrated screen at that time.  Please go to your ultrasound on February 10 and start taking prenatal vitamins.  If you have any questions or concerns, please feel free to call the clinic.   Be well,  Dr. Shan Levans

## 2018-12-25 NOTE — Telephone Encounter (Signed)
[facility] (Dr. Luvenia Redden) called and they were scheduled to do a deep cleaning on this patient today.   Since she is pregnant ( and high risk per patient) they will need a letter stating was is acceptable to perform in pregnancy.  Asked if they had a sample letter that can be completed and faxed back, but they do not.   They are requesting a letter that states if a pt can have normal dental procedures, such as local anesthetic or xray, faxed to [phone].  Will forward to pregnancy MD. Clinton Sawyer, Salome Spotted, [place]

## 2018-12-25 NOTE — Telephone Encounter (Signed)
Letter printed and faxed per Dr. Shan Levans request. Katharina Caper, Kalix Meinecke D, CMA

## 2018-12-25 NOTE — Telephone Encounter (Signed)
I have written the letter for the patient giving permission for her to receive local anesthesia and dental x-rays during her pregnancy.  Since I'm at the cardiology office, would you print it out and fax it to the dental office please (fax number is in [place] message)?  Thank you!

## 2018-12-26 ENCOUNTER — Encounter: Payer: Self-pay | Admitting: Family Medicine

## 2018-12-27 LAB — HGB FRAC. W/SOLUBILITY
HGB VARIANT: 0 %
Hgb A2 Quant: 2.7 % (ref 1.8–3.2)
Hgb A: 97.3 % (ref 96.4–98.8)
Hgb C: 0 %
Hgb F Quant: 0 % (ref 0.0–2.0)
Hgb S: 0 %
Hgb Solubility: NEGATIVE

## 2018-12-27 LAB — RPR: RPR Ser Ql: NONREACTIVE

## 2018-12-27 LAB — HEPATITIS B SURFACE ANTIGEN: HEP B S AG: NEGATIVE

## 2018-12-27 LAB — HIV ANTIBODY (ROUTINE TESTING W REFLEX): HIV SCREEN 4TH GENERATION: NONREACTIVE

## 2018-12-27 LAB — RUBELLA SCREEN: Rubella Antibodies, IGG: 3.26 index (ref >0.99)

## 2018-12-28 ENCOUNTER — Telehealth: Payer: Self-pay | Admitting: Family Medicine

## 2018-12-28 ENCOUNTER — Encounter: Payer: Self-pay | Admitting: Family Medicine

## 2018-12-28 NOTE — Telephone Encounter (Signed)
Pt called and asked Dr. Gwendlyn Deutscher to write her a written note for her job. Please call the pt call back at  [phone]. ad

## 2018-12-28 NOTE — Telephone Encounter (Signed)
Faxed another letter to [facility] saying that dental x-rays and all local anesthesia are permitted in patient's high risk pregnancy after dental office requested that I include "high risk" in the letter and specify which local anesthetics were permissible.

## 2018-12-28 NOTE — Telephone Encounter (Signed)
Need to know why she need restriction. Please have her come in to be seen by the resident providing her OB care. She can see me too. Thanks.

## 2018-12-28 NOTE — Telephone Encounter (Signed)
Spoke with patient and she states that she needs a letter for work with some restrictions.  Jazmin [name],CMA

## 2018-12-29 ENCOUNTER — Encounter: Payer: Self-pay | Admitting: Family Medicine

## 2018-12-30 ENCOUNTER — Telehealth: Payer: Self-pay | Admitting: Family Medicine

## 2018-12-30 NOTE — Telephone Encounter (Signed)
Kaitlyn Hunt is a G51P0101 [redacted]w[redacted]d who is having intermittent vaginal bleeding that has been occurring for the past several hours.  It is associated with some mild abdominal pain, cramping, lower back pain.  It is not associated with vaginal discharge, fevers, chills, vomiting, diarrhea, leakage of fluid, dizziness.  Baby has been moving around normally.  Is not associated with eating.  Informed patient to go to [facility] for further work-up.  Will forward after-hours call  message to PCP.

## 2018-12-31 ENCOUNTER — Telehealth: Payer: Self-pay | Admitting: Family Medicine

## 2018-12-31 ENCOUNTER — Encounter: Payer: Self-pay | Admitting: Family Medicine

## 2018-12-31 ENCOUNTER — Ambulatory Visit (HOSPITAL_COMMUNITY)
Admission: RE | Admit: 2018-12-31 | Discharge: 2018-12-31 | Disposition: A | Discharge status: Home / Self Care | Payer: Medicaid Other | Source: Ambulatory Visit | Attending: Obstetrics & Gynecology | Admitting: Obstetrics & Gynecology

## 2018-12-31 ENCOUNTER — Ambulatory Visit (INDEPENDENT_AMBULATORY_CARE_PROVIDER_SITE_OTHER): Payer: Medicaid Other | Admitting: *Deleted

## 2018-12-31 DIAGNOSIS — Z3A01 Less than 8 weeks gestation of pregnancy: Secondary | ICD-10-CM | POA: Diagnosis not present

## 2018-12-31 DIAGNOSIS — O3680X0 Pregnancy with inconclusive fetal viability, not applicable or unspecified: Secondary | ICD-10-CM | POA: Diagnosis not present

## 2018-12-31 NOTE — Progress Notes (Signed)
Here for Kaitlyn Hunt results. REveiwed with Dr. Roselie Awkward and informed patient Kaitlyn Hunt shows live baby with EDd 08/22/19.We recommend she start prenatal care and prenatal vitamins. She already has new ob appt. She voices understanding.  Linda,RN

## 2018-12-31 NOTE — Progress Notes (Signed)
[name], Kaitlyn Hunt, CMA  You 56 minutes ago (3:03 PM)      Spoke with patient and her manager while she was at work today.  She said that she just needs a letter stating whether or not she will need restrictions for her job.  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  Letter can also say that there are no restrictions but she just needs some clarification for her manager.  Will forward to MD. [name]       Documentation     [name], Kaitlyn Hunt, CMA 58 minutes ago (3:00 PM)      Pt called and asked if the written note for her job is ready. She needed for her job. Please call the pt back [phone]. ad      Documentation      You  [name], CMA 3 days ago      Need to know why she need restriction. Please have her come in to be seen by the resident providing her OB care. She can see me too. Thanks.      Documentation     [name], Kaitlyn Hunt, CMA  You 3 days ago      Spoke with patient and she states that she needs a letter for work with some restrictions.  Kaitlyn [name],CMA       Documentation     Hershal Coria routed conversation to [employer] 3 days ago    Hershal Coria 3 days ago      Pt called and asked Dr. Gwendlyn Deutscher to write her a written note for her job. Please call the pt call back at  [phone]. ad      Documentation     Roann, Merk [phone]  Salome Spotted R 3 days ago

## 2018-12-31 NOTE — Telephone Encounter (Signed)
Pt called and asked if the written note for her job is ready. She needed for her job. Please call the pt back at [phone]. ad

## 2018-12-31 NOTE — Telephone Encounter (Signed)
Pt called and asked if the written note for her job is ready. She needed for her job. Please call the pt back at [phone]. ad

## 2018-12-31 NOTE — Telephone Encounter (Signed)
Letter is on file. Please print for her whenever she comes in for it. Thanks.

## 2018-12-31 NOTE — Telephone Encounter (Signed)
Spoke with patient and her manager while she was at work today.  She said that she just needs a letter stating whether or not she will need restrictions for her job.  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  Letter can also say that there are no restrictions but she just needs some clarification for her manager.  Will forward to MD. [name]

## 2018-12-31 NOTE — Progress Notes (Signed)
Patient ID: Kaitlyn Hunt, female   DOB: Jun 28, 1991, 28 y.o.   MRN: [id number] I have reviewed the chart and agree with nursing staff's documentation of this patient's encounter.  Emeterio Reeve, MD 12/31/2018 11:04 AM

## 2019-01-01 ENCOUNTER — Encounter: Payer: Self-pay | Admitting: Family Medicine

## 2019-01-01 NOTE — Telephone Encounter (Signed)
Patient informed that letter is ready for pick up at the front desk.  Jazmin [name],CMA

## 2019-01-24 ENCOUNTER — Encounter: Payer: Self-pay | Admitting: Family Medicine

## 2019-01-25 ENCOUNTER — Encounter: Payer: Self-pay | Admitting: Family Medicine

## 2019-01-28 ENCOUNTER — Encounter: Payer: Self-pay | Admitting: Family Medicine

## 2019-01-28 ENCOUNTER — Telehealth: Payer: Self-pay

## 2019-01-28 NOTE — Telephone Encounter (Signed)
LVM for pt stating the due to the flu restrictions even if her son was to wear a mask he would not be able to go with her to her appt. Salvatore Marvel, CMA

## 2019-01-29 ENCOUNTER — Encounter: Payer: Self-pay | Admitting: *Deleted

## 2019-01-29 ENCOUNTER — Encounter: Payer: Self-pay | Admitting: Family Medicine

## 2019-01-29 ENCOUNTER — Other Ambulatory Visit: Payer: Self-pay

## 2019-01-29 ENCOUNTER — Ambulatory Visit (INDEPENDENT_AMBULATORY_CARE_PROVIDER_SITE_OTHER): Payer: Medicaid Other | Admitting: *Deleted

## 2019-01-29 VITALS — BP 130/67 | HR 82 | Wt 104.9 lb

## 2019-01-29 DIAGNOSIS — O09299 Supervision of pregnancy with other poor reproductive or obstetric history, unspecified trimester: Secondary | ICD-10-CM

## 2019-01-29 DIAGNOSIS — O34219 Maternal care for unspecified type scar from previous cesarean delivery: Secondary | ICD-10-CM

## 2019-01-29 DIAGNOSIS — O0990 Supervision of high risk pregnancy, unspecified, unspecified trimester: Secondary | ICD-10-CM

## 2019-01-29 HISTORY — DX: Supervision of pregnancy with other poor reproductive or obstetric history, unspecified trimester: O09.299

## 2019-01-29 LAB — POCT URINALYSIS DIP (DEVICE)
Bilirubin Urine: NEGATIVE
GLUCOSE, UA: NEGATIVE mg/dL
Hgb urine dipstick: NEGATIVE
Ketones, ur: NEGATIVE mg/dL
Nitrite: NEGATIVE
Protein, ur: NEGATIVE mg/dL
Specific Gravity, Urine: 1.02 (ref 1.005–1.030)
Urobilinogen, UA: 0.2 mg/dL (ref 0.0–1.0)
pH: 7 (ref 5.0–8.0)

## 2019-01-29 NOTE — Progress Notes (Signed)
New Ob intake completed and pregnancy information packet given. Most prenatal labs have already been completed. The remainder will be done @ next visit along with genetic screening. Medicaid Home form completed. Pt has initial prenatal visit scheduled on 02/04/19.

## 2019-01-30 NOTE — Progress Notes (Signed)
I have reviewed the chart and agree with nursing staff's documentation of this patient's encounter.  Given history of eclampsia, will do baseline HTN labs, also start low dose aspirin therapy soon.  Verita Schneiders, MD 01/30/2019 2:00 PM

## 2019-01-31 LAB — URINE CULTURE, OB REFLEX

## 2019-01-31 LAB — CULTURE, OB URINE

## 2019-02-04 ENCOUNTER — Other Ambulatory Visit: Payer: Self-pay

## 2019-02-04 ENCOUNTER — Other Ambulatory Visit (HOSPITAL_COMMUNITY)
Admission: RE | Admit: 2019-02-04 | Discharge: 2019-02-04 | Disposition: A | Discharge status: Home / Self Care | Payer: Medicaid Other | Source: Ambulatory Visit | Attending: Obstetrics & Gynecology | Admitting: Obstetrics & Gynecology

## 2019-02-04 ENCOUNTER — Encounter: Payer: Self-pay | Admitting: Obstetrics & Gynecology

## 2019-02-04 ENCOUNTER — Ambulatory Visit (INDEPENDENT_AMBULATORY_CARE_PROVIDER_SITE_OTHER): Payer: Medicaid Other | Admitting: Obstetrics & Gynecology

## 2019-02-04 VITALS — BP 114/70 | HR 65 | Wt 104.2 lb

## 2019-02-04 DIAGNOSIS — O3441 Maternal care for other abnormalities of cervix, first trimester: Secondary | ICD-10-CM

## 2019-02-04 DIAGNOSIS — Z3A11 11 weeks gestation of pregnancy: Secondary | ICD-10-CM | POA: Diagnosis not present

## 2019-02-04 DIAGNOSIS — D260 Other benign neoplasm of cervix uteri: Secondary | ICD-10-CM

## 2019-02-04 DIAGNOSIS — E0590 Thyrotoxicosis, unspecified without thyrotoxic crisis or storm: Secondary | ICD-10-CM

## 2019-02-04 DIAGNOSIS — O23591 Infection of other part of genital tract in pregnancy, first trimester: Secondary | ICD-10-CM

## 2019-02-04 DIAGNOSIS — O09291 Supervision of pregnancy with other poor reproductive or obstetric history, first trimester: Secondary | ICD-10-CM

## 2019-02-04 DIAGNOSIS — O0993 Supervision of high risk pregnancy, unspecified, third trimester: Secondary | ICD-10-CM

## 2019-02-04 DIAGNOSIS — A5901 Trichomonal vulvovaginitis: Secondary | ICD-10-CM | POA: Diagnosis not present

## 2019-02-04 DIAGNOSIS — O34219 Maternal care for unspecified type scar from previous cesarean delivery: Secondary | ICD-10-CM

## 2019-02-04 DIAGNOSIS — N760 Acute vaginitis: Secondary | ICD-10-CM | POA: Diagnosis not present

## 2019-02-04 DIAGNOSIS — O0991 Supervision of high risk pregnancy, unspecified, first trimester: Secondary | ICD-10-CM

## 2019-02-04 DIAGNOSIS — Z315 Encounter for genetic counseling: Secondary | ICD-10-CM | POA: Diagnosis not present

## 2019-02-04 DIAGNOSIS — Z3481 Encounter for supervision of other normal pregnancy, first trimester: Secondary | ICD-10-CM | POA: Diagnosis not present

## 2019-02-04 DIAGNOSIS — O09299 Supervision of pregnancy with other poor reproductive or obstetric history, unspecified trimester: Secondary | ICD-10-CM | POA: Diagnosis not present

## 2019-02-04 DIAGNOSIS — B373 Candidiasis of vulva and vagina: Secondary | ICD-10-CM | POA: Diagnosis not present

## 2019-02-04 DIAGNOSIS — B3731 Acute candidiasis of vulva and vagina: Secondary | ICD-10-CM

## 2019-02-04 DIAGNOSIS — D069 Carcinoma in situ of cervix, unspecified: Secondary | ICD-10-CM

## 2019-02-04 MED ORDER — ASPIRIN EC 81 MG PO TBEC: 81.0000 mg | DELAYED_RELEASE_TABLET | Freq: Every day | ORAL | 2 refills | Status: DC

## 2019-02-04 NOTE — Progress Notes (Signed)
History:   MARANDA MARTE is a 28 y.o. G2P0101 at [redacted]w[redacted]d by LMP, early ultrasound being seen today for her first obstetrical visit.  Her obstetrical history is significant for history of eclampsia leading to preterm delivery and cesarean section (due to fetal distress) at [redacted]w[redacted]d, recent diagnosis of trichomonal vaginitis. Recent CIN III leading to LEEP in 10/2018, pathology showed CIN III with negative margins.   Pregnancy history fully reviewed.  Patient reports no complaints.      HISTORY: OB History  Gravida Para Term Preterm AB Living  2 1 0 1 0 1  SAB TAB Ectopic Multiple Live Births  0 0 0 0 1    # Outcome Date GA Lbr Len/2nd Weight Sex Delivery Anes PTL Lv  2 Current           1 Preterm 02/15/11 [redacted]w[redacted]d  3 lb 7.4 oz (1.57 kg) M  EPI N LIV     Birth Comments: LTCS     Complications: Fetal Intolerance     Name: WILLIAMS,ZYKERION     Apgar1: 4  Apgar5: 7     Past Medical History:  Diagnosis Date  . Amenorrhea 09/23/2014  . 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 ATTENTION DEFICIT, W/HYPERACTIVITY 01/18/2007   Qualifier: Diagnosis of  By: Hoy Morn MD, HEIDI    . Bipolar 1 disorder ([place])    previously on Depakote D/C 5/11 bc pt stated it made her feel lazy   . Eclampsia in pregnancy   . 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 H/O LEEP 10/25/2018  . Hyperthyroidism   . Low TSH level 04/27/2015  . Ovarian mass, right 02/04/2016  . Pregnancy induced hypertension   . PROBLEMS, BEHAVIORAL NEC 08/03/2007   Qualifier: Diagnosis of  By: Hoy Morn MD, HEIDI    . Trichimoniasis   . Vaginal Pap smear, abnormal    Past Surgical History:  Procedure Laterality Date  . CESAREAN SECTION  2012   Fetal intolerance following epidural  . WISDOM TOOTH EXTRACTION     Family History  Problem Relation Age of Onset  . Bipolar disorder Mother   . Bipolar disorder Maternal Aunt   . Bipolar disorder Sister    Social History   Tobacco Use  . Smoking status: Former Smoker    Packs/day: 0.50    Types: Cigarettes    Last attempt to quit: 12/14/2018    Years since quitting: 0.1  . Smokeless tobacco: Never Used  Substance Use Topics  . Alcohol use: Not Currently  . Drug use: No   No Known Allergies Current Outpatient Medications on File Prior to Visit  Medication Sig Dispense Refill  . Prenatal MV & Min w/FA-DHA (PRENATAL ADULT GUMMY/DHA/FA) 0.4-25 MG CHEW Chew 0.4 mg by mouth daily. 30 tablet 8   No current facility-administered medications on file prior to visit.     Review of Systems Pertinent items noted in HPI and remainder of comprehensive ROS otherwise negative. Physical Exam:   Vitals:   02/04/19 1041  BP: 114/70  Pulse: 65  Weight: 104 lb 3.2 oz (47.3 kg)   Fetal Heart Rate (bpm): 168 General: well-developed, well-nourished female in no acute distress  Breasts:  normal appearance, no masses or tenderness bilaterally  Skin: normal coloration and turgor, no rashes  Neurologic: oriented, normal, negative, normal mood  Extremities: normal strength, tone, and muscle mass, ROM of all joints is normal  HEENT PERRLA, extraocular movement intact and sclera clear, anicteric  Mouth/Teeth mucous membranes moist,  pharynx normal without lesions and dental hygiene good  Neck supple and no masses  Cardiovascular: regular rate and rhythm  Respiratory:  no respiratory distress, normal breath sounds  Abdomen: soft, non-tender; bowel sounds normal; no masses,  no organomegaly  Pelvic: deferred     Assessment:    Pregnancy: G2P0101 Patient Active Problem List   Diagnosis Date Noted  . History of Eclampsia in prior pregnancy, currently pregnant 01/29/2019  . History of cesarean delivery, antepartum 01/29/2019  . Supervision of high-risk pregnancy 12/14/2018  . Hyperthyroidism 12/14/2018  . CIN III (cervical intraepithelial neoplasia grade III) with severe dysplasia 01/22/2016  . Bipolar disorder ([place]) 10/09/2013  . SYSTOLIC MURMUR 33/82/5053  .  GOITER, UNSPECIFIED 10/31/2007     Plan:    1. History of Eclampsia in prior pregnancy, currently pregnant Baseline labs ordered, ASA to start later this week.  - aspirin EC 81 MG tablet; Take 1 tablet (81 mg total) by mouth daily. Take after 12 weeks for prevention of preeclampsia later in pregnancy  Dispense: 300 tablet; Refill: 2 - Comprehensive metabolic panel - Protein / creatinine ratio, urine  2. CIN III (cervical intraepithelial neoplasia grade III) with severe dysplasia Had LEEP in 10/2018 for CIN III, negative margins. Needs repeat pap in 10/2019 (postpartum).  Will do cervical length check in about 4 weeks. - [place] MFM OB Comp Less 14 Wks; Future - [place] MFM OB TRANSVAGINAL; Future  3. History of cesarean delivery, antepartum Unsure about mode of delivery for now.  4. Hyperthyroidism Normal thyroid panel in 11/2018  5. Vaginitis affecting pregnancy in first trimester, antepartum - Cervicovaginal ancillary self-swab done for Trichomonas TOC  6. Supervision of high risk pregnancy in third trimester - Genetic Screening Initial labs drawn previously and reviewed. Continue prenatal vitamins. Genetic Screening discussed, NIPS: ordered. Ultrasound discussed; fetal anatomic survey: to be ordered next visit Problem list reviewed and updated. The nature of [facility] with multiple MDs and other Advanced Practice Providers was explained to patient; also emphasized that residents, students are part of our team. Routine obstetric precautions reviewed. Return in about 4 weeks (around 03/04/2019) for OB Visit ([place]).     Verita Schneiders, MD, [place] for [employer], [place]

## 2019-02-05 ENCOUNTER — Encounter: Payer: Self-pay | Admitting: *Deleted

## 2019-02-05 ENCOUNTER — Encounter: Payer: Self-pay | Admitting: Obstetrics & Gynecology

## 2019-02-05 LAB — CERVICOVAGINAL ANCILLARY ONLY
Bacterial vaginitis: POSITIVE — AB
Candida vaginitis: POSITIVE — AB
Chlamydia: NEGATIVE
Neisseria Gonorrhea: NEGATIVE
Trichomonas: POSITIVE — AB

## 2019-02-05 LAB — COMPREHENSIVE METABOLIC PANEL
ALT: 10 IU/L (ref 0–32)
AST: 13 IU/L (ref 0–40)
Albumin/Globulin Ratio: 1.6 (ref 1.2–2.2)
Albumin: 4.2 g/dL (ref 3.9–5.0)
Alkaline Phosphatase: 60 IU/L (ref 39–117)
BUN/Creatinine Ratio: 15 (ref 9–23)
BUN: 9 mg/dL (ref 6–20)
Bilirubin Total: <0.2 mg/dL (ref 0.0–1.2)
CO2: 23 mmol/L (ref 20–29)
Calcium: 10 mg/dL (ref 8.7–10.2)
Chloride: 100 mmol/L (ref 96–106)
Creatinine, Ser: 0.6 mg/dL (ref 0.57–1.00)
GFR calc Af Amer: 142 mL/min/{1.73_m2} (ref >59)
GFR calc non Af Amer: 124 mL/min/{1.73_m2} (ref >59)
Globulin, Total: 2.7 g/dL (ref 1.5–4.5)
Glucose: 80 mg/dL (ref 65–99)
POTASSIUM: 3.9 mmol/L (ref 3.5–5.2)
SODIUM: 135 mmol/L (ref 134–144)
Total Protein: 6.9 g/dL (ref 6.0–8.5)

## 2019-02-05 LAB — PROTEIN / CREATININE RATIO, URINE
Creatinine, Urine: 49.5 mg/dL
Protein, Ur: 6 mg/dL
Protein/Creat Ratio: 121 mg/g creat (ref 0–200)

## 2019-02-06 MED ORDER — TINIDAZOLE 500 MG PO TABS: 1.0000 g | ORAL_TABLET | Freq: Two times a day (BID) | ORAL | 0 refills | Status: AC

## 2019-02-06 MED ORDER — TERCONAZOLE 0.8 % VA CREA: 1.0000 | TOPICAL_CREAM | Freq: Every day | VAGINAL | 0 refills | Status: DC

## 2019-02-06 NOTE — Progress Notes (Signed)
Vaginal discharge test is abnormal and showed bacterial vaginitis, recurrent trichomonas and yeast infection. Patient was called with these results.  She reports that she has already taking Metronidazole two other times (bid x 7 days) and is not sexually active with that partner or anybody.  Given possibility of resistance, will prescribe Tinidazole instead, this will cover both trichomonal vaginitis and BV.  Terazol prescribed for yeast infection.   Patient advised to pick up prescriptions and take as directed. Will do test of cure in about 4 weeks.  Verita Schneiders, MD, [place] for [employer], [place]

## 2019-02-06 NOTE — Addendum Note (Signed)
Addended by: Verita Schneiders A on: 02/06/2019 04:44 PM   Modules accepted: Orders

## 2019-02-11 ENCOUNTER — Encounter: Payer: Self-pay | Admitting: *Deleted

## 2019-02-25 ENCOUNTER — Telehealth: Payer: Self-pay | Admitting: Family Medicine

## 2019-02-25 ENCOUNTER — Inpatient Hospital Stay (HOSPITAL_COMMUNITY)
Admission: AD | Admit: 2019-02-25 | Discharge: 2019-02-25 | Disposition: A | Discharge status: Home / Self Care | Payer: Medicaid Other | Attending: Obstetrics and Gynecology | Admitting: Obstetrics and Gynecology

## 2019-02-25 ENCOUNTER — Other Ambulatory Visit: Payer: Self-pay

## 2019-02-25 ENCOUNTER — Encounter (HOSPITAL_COMMUNITY): Payer: Self-pay

## 2019-02-25 DIAGNOSIS — Z87891 Personal history of nicotine dependence: Secondary | ICD-10-CM | POA: Insufficient documentation

## 2019-02-25 DIAGNOSIS — R109 Unspecified abdominal pain: Secondary | ICD-10-CM | POA: Diagnosis not present

## 2019-02-25 DIAGNOSIS — O0990 Supervision of high risk pregnancy, unspecified, unspecified trimester: Secondary | ICD-10-CM

## 2019-02-25 DIAGNOSIS — Z3A14 14 weeks gestation of pregnancy: Secondary | ICD-10-CM

## 2019-02-25 DIAGNOSIS — O98812 Other maternal infectious and parasitic diseases complicating pregnancy, second trimester: Secondary | ICD-10-CM | POA: Diagnosis not present

## 2019-02-25 DIAGNOSIS — O09292 Supervision of pregnancy with other poor reproductive or obstetric history, second trimester: Secondary | ICD-10-CM

## 2019-02-25 DIAGNOSIS — O09299 Supervision of pregnancy with other poor reproductive or obstetric history, unspecified trimester: Secondary | ICD-10-CM

## 2019-02-25 DIAGNOSIS — A599 Trichomoniasis, unspecified: Secondary | ICD-10-CM | POA: Insufficient documentation

## 2019-02-25 DIAGNOSIS — Z7982 Long term (current) use of aspirin: Secondary | ICD-10-CM | POA: Insufficient documentation

## 2019-02-25 DIAGNOSIS — O34219 Maternal care for unspecified type scar from previous cesarean delivery: Secondary | ICD-10-CM | POA: Diagnosis not present

## 2019-02-25 DIAGNOSIS — Z3492 Encounter for supervision of normal pregnancy, unspecified, second trimester: Secondary | ICD-10-CM

## 2019-02-25 DIAGNOSIS — O26851 Spotting complicating pregnancy, first trimester: Secondary | ICD-10-CM | POA: Insufficient documentation

## 2019-02-25 DIAGNOSIS — O26892 Other specified pregnancy related conditions, second trimester: Secondary | ICD-10-CM | POA: Insufficient documentation

## 2019-02-25 LAB — CBC
HCT: 33.4 % — ABNORMAL LOW (ref 36.0–46.0)
Hemoglobin: 11.3 g/dL — ABNORMAL LOW (ref 12.0–15.0)
MCH: 30.4 pg (ref 26.0–34.0)
MCHC: 33.8 g/dL (ref 30.0–36.0)
MCV: 89.8 fL (ref 80.0–100.0)
Platelets: 232 10*3/uL (ref 150–400)
RBC: 3.72 MIL/uL — ABNORMAL LOW (ref 3.87–5.11)
RDW: 12.2 % (ref 11.5–15.5)
WBC: 13.2 10*3/uL — ABNORMAL HIGH (ref 4.0–10.5)
nRBC: 0 % (ref 0.0–0.2)

## 2019-02-25 LAB — URINALYSIS, ROUTINE W REFLEX MICROSCOPIC
Bilirubin Urine: NEGATIVE
Glucose, UA: NEGATIVE mg/dL
Hgb urine dipstick: NEGATIVE
Ketones, ur: 5 mg/dL — AB
Nitrite: NEGATIVE
Protein, ur: NEGATIVE mg/dL
Specific Gravity, Urine: 1.021 (ref 1.005–1.030)
pH: 6 (ref 5.0–8.0)

## 2019-02-25 MED ORDER — METRONIDAZOLE 500 MG PO TABS
2000.0000 mg | ORAL_TABLET | Freq: Once | ORAL | Status: AC
  Administered 2019-02-25: 2000 mg via ORAL
  Filled 2019-02-25: qty 4

## 2019-02-25 MED ORDER — ONDANSETRON 4 MG PO TBDP
4.0000 mg | ORAL_TABLET | Freq: Once | ORAL | Status: AC
  Administered 2019-02-25: 4 mg via ORAL
  Filled 2019-02-25: qty 1

## 2019-02-25 NOTE — Discharge Instructions (Signed)
Vaginal Bleeding During Pregnancy, First Trimester    A small amount of bleeding (spotting) from the vagina is common during early pregnancy. Sometimes the bleeding is normal and does not cause problems. At other times, though, bleeding may be a sign of something serious. Tell your doctor about any bleeding from your vagina right away.  Follow these instructions at home:  Activity  · Follow your doctor's instructions about how active you can be.  · If needed, make plans for someone to help with your normal activities.  · Do not have sex or orgasms until your doctor says that this is safe.  General instructions  · Take over-the-counter and prescription medicines only as told by your doctor.  · Watch your condition for any changes.  · Write down:  ? The number of pads you use each day.  ? How often you change pads.  ? How soaked (saturated) your pads are.  · Do not use tampons.  · Do not douche.  · If you pass any tissue from your vagina, save it to show to your doctor.  · Keep all follow-up visits as told by your doctor. This is important.  Contact a doctor if:  · You have vaginal bleeding at any time while you are pregnant.  · You have cramps.  · You have a fever.  Get help right away if:  · You have very bad cramps in your back or belly (abdomen).  · You pass large clots or a lot of tissue from your vagina.  · Your bleeding gets worse.  · You feel light-headed.  · You feel weak.  · You pass out (faint).  · You have chills.  · You are leaking fluid from your vagina.  · You have a gush of fluid from your vagina.  Summary  · Sometimes vaginal bleeding during pregnancy is normal and does not cause problems. At other times, bleeding may be a sign of something serious.  · Tell your doctor about any bleeding from your vagina right away.  · Follow your doctor's instructions about how active you can be. You may need someone to help you with your normal activities.  This information is not intended to replace advice given to  you by your health care provider. Make sure you discuss any questions you have with your health care provider.  Document Released: 03/24/2014 Document Revised: 02/08/2017 Document Reviewed: 02/08/2017  Elsevier Interactive Patient Education © 2019 Elsevier Inc.

## 2019-02-25 NOTE — Telephone Encounter (Signed)
[facility] Telephone Triage Note  Kaitlyn Hunt is a 28 y.o. G2P0101 at [redacted]w[redacted]d who is calling complaining of slight vaginal bleeding. Noticed mild pink discharge when wiping the other day. Today is still pink but a bit more. No clots. Has some discomfort which she thinks is gas.  Record reviewed, she is followed at [facility] by high risk OB due to history of eclampsia, c/s at 34 weeks, and recent LEEP.   Recommend she call [facility] for advice since she is a high risk pregnancy and is being followed by their clinic. May need to be seen in MAU for eval of cervix etc - will defer decision to [facility] providers. Patient in agreement and stated she would call them.  Chrisandra Netters, MD [place]

## 2019-02-25 NOTE — Telephone Encounter (Signed)
The patient called in and stated she is experiencing bleeding. Advised to go to MAU immediately. The patient stated she has access to cuff however needs assistance with downloading the baby scripts app. Advised I will send a message to the nurse today.

## 2019-02-25 NOTE — MAU Provider Note (Signed)
History     CSN: [id number]  Arrival date and time: 02/25/19 1709   First Provider Initiated Contact with Patient 02/25/19 1831      Chief Complaint  Patient presents with  . Vaginal Bleeding   HPI Kaitlyn Hunt is a 28 y.o. G2P0101 at [redacted]w[redacted]d who presents to MAU with chief complaints of abdominal cramping and vaginal spotting. These are recurring problems this pregnancy.  She denies heavy vaginal bleeding,urinary symptoms, abnormal vaginal discharge, fever, falls, or recent illness.    Vaginal spotting Patient reports seeing spotting after voiding. This started a couple days ago but seems to be more frequent and heavier than it was.  Abdominal Cramping Patient endorses "really mild" abdominal cramping. She states "I've always had it". Her pain is suprapubic and does not radiate. She declines medication for pain in MAU  Trichomonas Patient's history includes Trichomonas in early March. Patient states she had medicine in clinic but vomited shortly after taking it. Her Provider called in another medicine to her pharmacy but she has not picked it up yet.  OB History    Gravida  2   Para  1   Term      Preterm  1   AB      Living  1     SAB      TAB      Ectopic      Multiple      Live Births  1           Past Medical History:  Diagnosis Date  . Amenorrhea 09/23/2014  . 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 ATTENTION DEFICIT, W/HYPERACTIVITY 01/18/2007   Qualifier: Diagnosis of  By: Hoy Morn MD, HEIDI    . Bipolar 1 disorder ([place])    previously on Depakote D/C 5/11 bc pt stated it made her feel lazy   . Eclampsia in pregnancy   . 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 H/O LEEP 10/25/2018  . Hyperthyroidism   . Low TSH level 04/27/2015  . Ovarian mass, right 02/04/2016  . Pregnancy induced hypertension   . PROBLEMS, BEHAVIORAL NEC 08/03/2007   Qualifier: Diagnosis of  By: Hoy Morn MD, HEIDI    . Trichimoniasis   . Vaginal Pap smear,  abnormal     Past Surgical History:  Procedure Laterality Date  . CESAREAN SECTION  2012   Fetal intolerance following epidural  . LEEP    . WISDOM TOOTH EXTRACTION      Family History  Problem Relation Age of Onset  . Bipolar disorder Mother   . Bipolar disorder Maternal Aunt   . Bipolar disorder Sister     Social History   Tobacco Use  . Smoking status: Former Smoker    Packs/day: 0.50    Types: Cigarettes    Last attempt to quit: 12/14/2018    Years since quitting: 0.2  . Smokeless tobacco: Never Used  Substance Use Topics  . Alcohol use: Not Currently  . Drug use: No    Allergies: No Known Allergies  Medications Prior to Admission  Medication Sig Dispense Refill Last Dose  . Prenatal MV & Min w/FA-DHA (PRENATAL ADULT GUMMY/DHA/FA) 0.4-25 MG CHEW Chew 0.4 mg by mouth daily. 30 tablet 8 02/24/2019 at Unknown time  . terconazole (TERAZOL 3) 0.8 % vaginal cream Place 1 applicator vaginally at bedtime. Apply nightly for three nights. 20 g 0 Past Month at Unknown time  . aspirin EC 81 MG tablet Take 1 tablet (81  mg total) by mouth daily. Take after 12 weeks for prevention of preeclampsia later in pregnancy 300 tablet 2 More than a month at Unknown time    Review of Systems  Constitutional: Negative for chills, fatigue and fever.  Respiratory: Negative for shortness of breath.   Gastrointestinal: Positive for abdominal pain.  Genitourinary: Positive for vaginal bleeding. Negative for difficulty urinating, dysuria, pelvic pain, vaginal discharge and vaginal pain.  Musculoskeletal: Negative for back pain.  Neurological: Negative for headaches.  All other systems reviewed and are negative.  Physical Exam   Blood pressure 121/65, pulse 78, temperature 98.5 F (36.9 C), temperature source Oral, resp. rate 16, weight 48.2 kg, last menstrual period 11/16/2018, SpO2 100 %.  Physical Exam  Nursing note and vitals reviewed. Constitutional: She is oriented to person, place,  and time. She appears well-developed and well-nourished.  Cardiovascular: Normal rate.  Respiratory: Effort normal.  GI: Soft. She exhibits no distension. There is no abdominal tenderness. There is no rigidity, no rebound, no guarding and no CVA tenderness.  Genitourinary:    Vaginal discharge present.     Genitourinary Comments: No overt bleeding observed on SSE. Brownish green vaginal discharge throughout vaginal vault. Removed with fox swab x 3. No bleeding observed afterwards. No CMT. Cervix visibly closed    Neurological: She is alert and oriented to person, place, and time.  Skin: Skin is warm and dry.  Psychiatric: She has a normal mood and affect. Her behavior is normal. Judgment and thought content normal.    MAU Course/MDM  Procedures: sterile speculum exam   Patient Vitals for the past 24 hrs:  BP Temp Temp src Pulse Resp SpO2 Weight  02/25/19 1934 (!) 121/58 - - - - - -  02/25/19 1743 121/65 98.5 F (36.9 C) Oral 78 16 100 % 48.2 kg  02/25/19 1712 115/63 98.6 F (37 C) Oral 96 18 100 % -    Results for orders placed or performed during the hospital encounter of 02/25/19 (from the past 24 hour(s))  Urinalysis, Routine w reflex microscopic     Status: Abnormal   Collection Time: 02/25/19  6:05 PM  Result Value Ref Range   Color, Urine YELLOW YELLOW   APPearance HAZY (A) CLEAR   Specific Gravity, Urine 1.021 1.005 - 1.030   pH 6.0 5.0 - 8.0   Glucose, UA NEGATIVE NEGATIVE mg/dL   Hgb urine dipstick NEGATIVE NEGATIVE   Bilirubin Urine NEGATIVE NEGATIVE   Ketones, ur 5 (A) NEGATIVE mg/dL   Protein, ur NEGATIVE NEGATIVE mg/dL   Nitrite NEGATIVE NEGATIVE   Leukocytes,Ua SMALL (A) NEGATIVE   RBC / HPF 0-5 0 - 5 RBC/hpf   WBC, UA 6-10 0 - 5 WBC/hpf   Bacteria, UA RARE (A) NONE SEEN   Squamous Epithelial / LPF 6-10 0 - 5   Mucus PRESENT   CBC     Status: Abnormal   Collection Time: 02/25/19  7:10 PM  Result Value Ref Range   WBC 13.2 (H) 4.0 - 10.5 K/uL   RBC 3.72  (L) 3.87 - 5.11 MIL/uL   Hemoglobin 11.3 (L) 12.0 - 15.0 g/dL   HCT 33.4 (L) 36.0 - 46.0 %   MCV 89.8 80.0 - 100.0 fL   MCH 30.4 26.0 - 34.0 pg   MCHC 33.8 30.0 - 36.0 g/dL   RDW 12.2 11.5 - 15.5 %   Platelets 232 150 - 400 K/uL   nRBC 0.0 0.0 - 0.2 %     Assessment and  Plan  --28 y.o. G2P0101 at [redacted]w[redacted]d  --FHT 159 by doppler --Treat presumptively for previous positive Trichomonas without prior treatment --Blood type: O POS, Rhogam not indicated --Urine culture pending --Discharge home in stable condition  F/U: [place] 03/04/2019  [name], CNM 02/25/2019, 7:47 PM

## 2019-02-25 NOTE — MAU Note (Signed)
Pt has spotting a couple of days ago, but was a little heavier today. Not wearing a pad. Mild cramping.

## 2019-02-26 ENCOUNTER — Encounter: Payer: Self-pay | Admitting: *Deleted

## 2019-02-26 LAB — CULTURE, OB URINE: Culture: NO GROWTH

## 2019-02-26 NOTE — Telephone Encounter (Signed)
Returned pt's call regarding her bleeding and baby scripts.  Pt reports she is doing better since going to MAU and is no longer having any bleeding.  Pt reports she does not recall being signed up for baby scripts.  Order placed for schedule optimization and pt guided through set up. Pt reports she is unable to do it while on the phone with me.  Pt advised that once she got signed up, she needs to send a mychart message to let us know so that we can allow her to enter BP manually.  Pt verbalized understanding.

## 2019-03-04 ENCOUNTER — Encounter: Payer: Medicaid Other | Admitting: Obstetrics & Gynecology

## 2019-03-04 ENCOUNTER — Ambulatory Visit (HOSPITAL_COMMUNITY): Payer: Medicaid Other

## 2019-03-12 ENCOUNTER — Other Ambulatory Visit: Payer: Self-pay

## 2019-03-12 ENCOUNTER — Ambulatory Visit (HOSPITAL_COMMUNITY)
Admission: RE | Admit: 2019-03-12 | Discharge: 2019-03-12 | Disposition: A | Discharge status: Home / Self Care | Payer: Medicaid Other | Source: Ambulatory Visit | Attending: Obstetrics and Gynecology | Admitting: Obstetrics and Gynecology

## 2019-03-12 DIAGNOSIS — Z3A16 16 weeks gestation of pregnancy: Secondary | ICD-10-CM

## 2019-03-12 DIAGNOSIS — O09291 Supervision of pregnancy with other poor reproductive or obstetric history, first trimester: Secondary | ICD-10-CM

## 2019-03-12 DIAGNOSIS — D069 Carcinoma in situ of cervix, unspecified: Secondary | ICD-10-CM | POA: Diagnosis not present

## 2019-03-12 DIAGNOSIS — Z3686 Encounter for antenatal screening for cervical length: Secondary | ICD-10-CM

## 2019-03-12 DIAGNOSIS — O34219 Maternal care for unspecified type scar from previous cesarean delivery: Secondary | ICD-10-CM | POA: Diagnosis not present

## 2019-03-13 ENCOUNTER — Other Ambulatory Visit (HOSPITAL_COMMUNITY): Payer: Self-pay | Admitting: *Deleted

## 2019-03-13 DIAGNOSIS — Z9889 Other specified postprocedural states: Secondary | ICD-10-CM

## 2019-03-13 DIAGNOSIS — E0590 Thyrotoxicosis, unspecified without thyrotoxic crisis or storm: Secondary | ICD-10-CM

## 2019-03-13 DIAGNOSIS — O99282 Endocrine, nutritional and metabolic diseases complicating pregnancy, second trimester: Principal | ICD-10-CM

## 2019-03-13 DIAGNOSIS — O3442 Maternal care for other abnormalities of cervix, second trimester: Secondary | ICD-10-CM

## 2019-03-22 ENCOUNTER — Encounter: Payer: Self-pay | Admitting: *Deleted

## 2019-03-26 ENCOUNTER — Ambulatory Visit (HOSPITAL_COMMUNITY)
Admission: RE | Admit: 2019-03-26 | Discharge: 2019-03-26 | Disposition: A | Discharge status: Home / Self Care | Payer: Medicaid Other | Source: Ambulatory Visit | Attending: Maternal & Fetal Medicine | Admitting: Maternal & Fetal Medicine

## 2019-03-26 ENCOUNTER — Ambulatory Visit (HOSPITAL_COMMUNITY): Payer: Medicaid Other | Admitting: *Deleted

## 2019-03-26 ENCOUNTER — Encounter (HOSPITAL_COMMUNITY): Payer: Self-pay

## 2019-03-26 ENCOUNTER — Other Ambulatory Visit: Payer: Self-pay

## 2019-03-26 VITALS — Temp 98.1°F

## 2019-03-26 DIAGNOSIS — Z9889 Other specified postprocedural states: Secondary | ICD-10-CM | POA: Insufficient documentation

## 2019-03-26 DIAGNOSIS — O09299 Supervision of pregnancy with other poor reproductive or obstetric history, unspecified trimester: Secondary | ICD-10-CM | POA: Insufficient documentation

## 2019-03-26 DIAGNOSIS — Z3686 Encounter for antenatal screening for cervical length: Secondary | ICD-10-CM

## 2019-03-26 DIAGNOSIS — Z3A18 18 weeks gestation of pregnancy: Secondary | ICD-10-CM | POA: Diagnosis not present

## 2019-03-26 DIAGNOSIS — Z362 Encounter for other antenatal screening follow-up: Secondary | ICD-10-CM

## 2019-03-26 DIAGNOSIS — O09292 Supervision of pregnancy with other poor reproductive or obstetric history, second trimester: Secondary | ICD-10-CM

## 2019-03-26 DIAGNOSIS — O3442 Maternal care for other abnormalities of cervix, second trimester: Secondary | ICD-10-CM | POA: Insufficient documentation

## 2019-03-26 DIAGNOSIS — Z363 Encounter for antenatal screening for malformations: Secondary | ICD-10-CM

## 2019-03-26 DIAGNOSIS — O99282 Endocrine, nutritional and metabolic diseases complicating pregnancy, second trimester: Secondary | ICD-10-CM | POA: Insufficient documentation

## 2019-03-26 DIAGNOSIS — O34219 Maternal care for unspecified type scar from previous cesarean delivery: Secondary | ICD-10-CM | POA: Diagnosis not present

## 2019-03-26 DIAGNOSIS — Z148 Genetic carrier of other disease: Secondary | ICD-10-CM | POA: Diagnosis not present

## 2019-03-26 DIAGNOSIS — E0590 Thyrotoxicosis, unspecified without thyrotoxic crisis or storm: Secondary | ICD-10-CM | POA: Diagnosis not present

## 2019-03-27 ENCOUNTER — Other Ambulatory Visit (HOSPITAL_COMMUNITY): Payer: Self-pay | Admitting: *Deleted

## 2019-03-27 DIAGNOSIS — E0590 Thyrotoxicosis, unspecified without thyrotoxic crisis or storm: Secondary | ICD-10-CM

## 2019-03-27 DIAGNOSIS — O99282 Endocrine, nutritional and metabolic diseases complicating pregnancy, second trimester: Principal | ICD-10-CM

## 2019-04-10 ENCOUNTER — Telehealth: Payer: Medicaid Other | Admitting: Obstetrics and Gynecology

## 2019-04-10 ENCOUNTER — Other Ambulatory Visit: Payer: Self-pay

## 2019-04-10 DIAGNOSIS — O0992 Supervision of high risk pregnancy, unspecified, second trimester: Secondary | ICD-10-CM

## 2019-04-10 NOTE — Progress Notes (Signed)
1:15p, called pt no answer, left VM that will call back within 10 to 15 min.   1:29p, called pt back, still no answer, left VM, stating will have to reschedule visit.

## 2019-04-10 NOTE — Progress Notes (Signed)
Patient did not answer phone for her appointment, she will be called to reschedule.

## 2019-05-21 ENCOUNTER — Other Ambulatory Visit (HOSPITAL_COMMUNITY): Payer: Self-pay | Admitting: *Deleted

## 2019-05-21 ENCOUNTER — Ambulatory Visit (HOSPITAL_COMMUNITY)
Admission: RE | Admit: 2019-05-21 | Discharge: 2019-05-21 | Disposition: A | Discharge status: Home / Self Care | Payer: Medicaid Other | Source: Ambulatory Visit | Attending: Obstetrics and Gynecology | Admitting: Obstetrics and Gynecology

## 2019-05-21 ENCOUNTER — Other Ambulatory Visit (HOSPITAL_COMMUNITY): Payer: Self-pay | Admitting: Obstetrics and Gynecology

## 2019-05-21 ENCOUNTER — Other Ambulatory Visit: Payer: Self-pay

## 2019-05-21 DIAGNOSIS — Z3A26 26 weeks gestation of pregnancy: Secondary | ICD-10-CM

## 2019-05-21 DIAGNOSIS — O99282 Endocrine, nutritional and metabolic diseases complicating pregnancy, second trimester: Secondary | ICD-10-CM | POA: Diagnosis not present

## 2019-05-21 DIAGNOSIS — O09292 Supervision of pregnancy with other poor reproductive or obstetric history, second trimester: Secondary | ICD-10-CM | POA: Diagnosis not present

## 2019-05-21 DIAGNOSIS — E0590 Thyrotoxicosis, unspecified without thyrotoxic crisis or storm: Secondary | ICD-10-CM

## 2019-05-21 DIAGNOSIS — Z364 Encounter for antenatal screening for fetal growth retardation: Secondary | ICD-10-CM | POA: Diagnosis not present

## 2019-05-21 DIAGNOSIS — O365990 Maternal care for other known or suspected poor fetal growth, unspecified trimester, not applicable or unspecified: Secondary | ICD-10-CM | POA: Diagnosis not present

## 2019-05-21 DIAGNOSIS — O09212 Supervision of pregnancy with history of pre-term labor, second trimester: Secondary | ICD-10-CM

## 2019-05-21 DIAGNOSIS — Z148 Genetic carrier of other disease: Secondary | ICD-10-CM

## 2019-05-21 DIAGNOSIS — O365920 Maternal care for other known or suspected poor fetal growth, second trimester, not applicable or unspecified: Secondary | ICD-10-CM

## 2019-05-21 DIAGNOSIS — O34219 Maternal care for unspecified type scar from previous cesarean delivery: Secondary | ICD-10-CM | POA: Diagnosis not present

## 2019-05-21 DIAGNOSIS — Z362 Encounter for other antenatal screening follow-up: Secondary | ICD-10-CM | POA: Diagnosis not present

## 2019-05-29 ENCOUNTER — Other Ambulatory Visit: Payer: Self-pay

## 2019-05-29 ENCOUNTER — Ambulatory Visit (HOSPITAL_COMMUNITY)
Admission: RE | Admit: 2019-05-29 | Discharge: 2019-05-29 | Disposition: A | Discharge status: Home / Self Care | Payer: Medicaid Other | Source: Ambulatory Visit | Attending: Obstetrics and Gynecology | Admitting: Obstetrics and Gynecology

## 2019-05-29 DIAGNOSIS — O365920 Maternal care for other known or suspected poor fetal growth, second trimester, not applicable or unspecified: Secondary | ICD-10-CM

## 2019-05-29 DIAGNOSIS — O34219 Maternal care for unspecified type scar from previous cesarean delivery: Secondary | ICD-10-CM

## 2019-05-29 DIAGNOSIS — O09292 Supervision of pregnancy with other poor reproductive or obstetric history, second trimester: Secondary | ICD-10-CM

## 2019-05-29 DIAGNOSIS — O99282 Endocrine, nutritional and metabolic diseases complicating pregnancy, second trimester: Secondary | ICD-10-CM | POA: Diagnosis not present

## 2019-05-29 DIAGNOSIS — O09212 Supervision of pregnancy with history of pre-term labor, second trimester: Secondary | ICD-10-CM

## 2019-05-29 DIAGNOSIS — E0590 Thyrotoxicosis, unspecified without thyrotoxic crisis or storm: Secondary | ICD-10-CM | POA: Diagnosis not present

## 2019-05-29 DIAGNOSIS — Z3A27 27 weeks gestation of pregnancy: Secondary | ICD-10-CM

## 2019-06-05 ENCOUNTER — Encounter: Payer: Medicaid Other | Admitting: Obstetrics and Gynecology

## 2019-06-05 ENCOUNTER — Ambulatory Visit (HOSPITAL_COMMUNITY)
Admission: RE | Admit: 2019-06-05 | Discharge: 2019-06-05 | Disposition: A | Discharge status: Home / Self Care | Payer: Medicaid Other | Source: Ambulatory Visit | Attending: Obstetrics and Gynecology | Admitting: Obstetrics and Gynecology

## 2019-06-05 ENCOUNTER — Other Ambulatory Visit (HOSPITAL_COMMUNITY): Payer: Self-pay | Admitting: *Deleted

## 2019-06-05 ENCOUNTER — Other Ambulatory Visit: Payer: Self-pay

## 2019-06-05 DIAGNOSIS — O09213 Supervision of pregnancy with history of pre-term labor, third trimester: Secondary | ICD-10-CM

## 2019-06-05 DIAGNOSIS — O99283 Endocrine, nutritional and metabolic diseases complicating pregnancy, third trimester: Secondary | ICD-10-CM

## 2019-06-05 DIAGNOSIS — O34219 Maternal care for unspecified type scar from previous cesarean delivery: Secondary | ICD-10-CM

## 2019-06-05 DIAGNOSIS — E0590 Thyrotoxicosis, unspecified without thyrotoxic crisis or storm: Secondary | ICD-10-CM | POA: Diagnosis not present

## 2019-06-05 DIAGNOSIS — Z364 Encounter for antenatal screening for fetal growth retardation: Secondary | ICD-10-CM | POA: Diagnosis not present

## 2019-06-05 DIAGNOSIS — Z3A28 28 weeks gestation of pregnancy: Secondary | ICD-10-CM

## 2019-06-05 DIAGNOSIS — O365930 Maternal care for other known or suspected poor fetal growth, third trimester, not applicable or unspecified: Secondary | ICD-10-CM | POA: Diagnosis not present

## 2019-06-05 DIAGNOSIS — Z148 Genetic carrier of other disease: Secondary | ICD-10-CM

## 2019-06-05 DIAGNOSIS — O09293 Supervision of pregnancy with other poor reproductive or obstetric history, third trimester: Secondary | ICD-10-CM | POA: Diagnosis not present

## 2019-06-05 DIAGNOSIS — O365990 Maternal care for other known or suspected poor fetal growth, unspecified trimester, not applicable or unspecified: Secondary | ICD-10-CM

## 2019-06-07 ENCOUNTER — Ambulatory Visit (HOSPITAL_COMMUNITY): Payer: Medicaid Other

## 2019-06-07 ENCOUNTER — Encounter (HOSPITAL_COMMUNITY): Payer: Self-pay

## 2019-06-07 ENCOUNTER — Ambulatory Visit (HOSPITAL_COMMUNITY): Payer: Medicaid Other | Attending: Obstetrics and Gynecology

## 2019-06-12 ENCOUNTER — Encounter (HOSPITAL_COMMUNITY): Payer: Self-pay

## 2019-06-12 ENCOUNTER — Other Ambulatory Visit (HOSPITAL_COMMUNITY): Payer: Self-pay | Admitting: Maternal & Fetal Medicine

## 2019-06-12 ENCOUNTER — Other Ambulatory Visit (HOSPITAL_COMMUNITY): Payer: Self-pay | Admitting: *Deleted

## 2019-06-12 ENCOUNTER — Ambulatory Visit (HOSPITAL_COMMUNITY)
Admission: RE | Admit: 2019-06-12 | Discharge: 2019-06-12 | Disposition: A | Discharge status: Home / Self Care | Payer: Medicaid Other | Source: Ambulatory Visit | Attending: Obstetrics and Gynecology | Admitting: Obstetrics and Gynecology

## 2019-06-12 ENCOUNTER — Other Ambulatory Visit: Payer: Self-pay

## 2019-06-12 ENCOUNTER — Ambulatory Visit (HOSPITAL_COMMUNITY): Payer: Medicaid Other | Admitting: *Deleted

## 2019-06-12 VITALS — BP 110/63 | HR 62

## 2019-06-12 DIAGNOSIS — O365920 Maternal care for other known or suspected poor fetal growth, second trimester, not applicable or unspecified: Secondary | ICD-10-CM

## 2019-06-12 DIAGNOSIS — O09213 Supervision of pregnancy with history of pre-term labor, third trimester: Secondary | ICD-10-CM | POA: Diagnosis not present

## 2019-06-12 DIAGNOSIS — O09299 Supervision of pregnancy with other poor reproductive or obstetric history, unspecified trimester: Secondary | ICD-10-CM

## 2019-06-12 DIAGNOSIS — O99283 Endocrine, nutritional and metabolic diseases complicating pregnancy, third trimester: Secondary | ICD-10-CM

## 2019-06-12 DIAGNOSIS — O99282 Endocrine, nutritional and metabolic diseases complicating pregnancy, second trimester: Secondary | ICD-10-CM

## 2019-06-12 DIAGNOSIS — O365990 Maternal care for other known or suspected poor fetal growth, unspecified trimester, not applicable or unspecified: Secondary | ICD-10-CM | POA: Diagnosis not present

## 2019-06-12 DIAGNOSIS — O34219 Maternal care for unspecified type scar from previous cesarean delivery: Secondary | ICD-10-CM | POA: Insufficient documentation

## 2019-06-12 DIAGNOSIS — O09212 Supervision of pregnancy with history of pre-term labor, second trimester: Secondary | ICD-10-CM | POA: Diagnosis not present

## 2019-06-12 DIAGNOSIS — O09293 Supervision of pregnancy with other poor reproductive or obstetric history, third trimester: Secondary | ICD-10-CM | POA: Diagnosis not present

## 2019-06-12 DIAGNOSIS — Z3A29 29 weeks gestation of pregnancy: Secondary | ICD-10-CM

## 2019-06-12 DIAGNOSIS — Z362 Encounter for other antenatal screening follow-up: Secondary | ICD-10-CM | POA: Diagnosis not present

## 2019-06-12 DIAGNOSIS — O09292 Supervision of pregnancy with other poor reproductive or obstetric history, second trimester: Secondary | ICD-10-CM

## 2019-06-12 DIAGNOSIS — O365930 Maternal care for other known or suspected poor fetal growth, third trimester, not applicable or unspecified: Secondary | ICD-10-CM | POA: Diagnosis not present

## 2019-06-12 NOTE — Procedures (Signed)
Kaitlyn Hunt [birth year] [redacted]w[redacted]d  Fetus A Non-Stress Test Interpretation for 06/12/19  Indication: IUGR  Fetal Heart Rate A Mode: External Baseline Rate (A): 150 bpm Variability: Moderate Accelerations: 10 x 10, 15 x 15 Decelerations: None Multiple birth?: No  Uterine Activity Mode: Palpation, Toco Contraction Frequency (min): none Resting Tone Palpated: Relaxed Resting Time: Adequate  Interpretation (Fetal Testing) Nonstress Test Interpretation: Reactive Comments: Reviewed FHR tracing with Dr. Gertie Exon

## 2019-06-18 ENCOUNTER — Ambulatory Visit (INDEPENDENT_AMBULATORY_CARE_PROVIDER_SITE_OTHER): Payer: Medicaid Other | Admitting: Obstetrics & Gynecology

## 2019-06-18 ENCOUNTER — Other Ambulatory Visit (HOSPITAL_COMMUNITY)
Admission: RE | Admit: 2019-06-18 | Discharge: 2019-06-18 | Disposition: A | Discharge status: Home / Self Care | Payer: Medicaid Other | Source: Ambulatory Visit | Attending: Obstetrics & Gynecology | Admitting: Obstetrics & Gynecology

## 2019-06-18 ENCOUNTER — Other Ambulatory Visit: Payer: Self-pay

## 2019-06-18 VITALS — BP 122/80 | HR 91 | Wt 117.4 lb

## 2019-06-18 DIAGNOSIS — O0993 Supervision of high risk pregnancy, unspecified, third trimester: Secondary | ICD-10-CM

## 2019-06-18 DIAGNOSIS — O365930 Maternal care for other known or suspected poor fetal growth, third trimester, not applicable or unspecified: Secondary | ICD-10-CM

## 2019-06-18 DIAGNOSIS — O09293 Supervision of pregnancy with other poor reproductive or obstetric history, third trimester: Secondary | ICD-10-CM

## 2019-06-18 DIAGNOSIS — O23593 Infection of other part of genital tract in pregnancy, third trimester: Secondary | ICD-10-CM

## 2019-06-18 DIAGNOSIS — O365990 Maternal care for other known or suspected poor fetal growth, unspecified trimester, not applicable or unspecified: Secondary | ICD-10-CM

## 2019-06-18 DIAGNOSIS — A5901 Trichomonal vulvovaginitis: Secondary | ICD-10-CM

## 2019-06-18 DIAGNOSIS — Z3A30 30 weeks gestation of pregnancy: Secondary | ICD-10-CM

## 2019-06-18 DIAGNOSIS — O34219 Maternal care for unspecified type scar from previous cesarean delivery: Secondary | ICD-10-CM | POA: Diagnosis not present

## 2019-06-18 DIAGNOSIS — O23591 Infection of other part of genital tract in pregnancy, first trimester: Secondary | ICD-10-CM | POA: Diagnosis not present

## 2019-06-18 DIAGNOSIS — O09299 Supervision of pregnancy with other poor reproductive or obstetric history, unspecified trimester: Secondary | ICD-10-CM

## 2019-06-18 DIAGNOSIS — O0992 Supervision of high risk pregnancy, unspecified, second trimester: Secondary | ICD-10-CM

## 2019-06-18 DIAGNOSIS — E0590 Thyrotoxicosis, unspecified without thyrotoxic crisis or storm: Secondary | ICD-10-CM | POA: Diagnosis not present

## 2019-06-18 HISTORY — DX: Maternal care for other known or suspected poor fetal growth, unspecified trimester, not applicable or unspecified: O36.5990

## 2019-06-18 NOTE — Progress Notes (Signed)
   PRENATAL VISIT NOTE  Subjective:  Kaitlyn Hunt is a 28 y.o. G2P0101 at [redacted]w[redacted]d being seen today for ongoing prenatal care.  She is currently monitored for the following issues for this high-risk pregnancy and has GOITER, UNSPECIFIED; SYSTOLIC MURMUR; Bipolar disorder ([place]); CIN III (cervical intraepithelial neoplasia grade III) with severe dysplasia; Supervision of high-risk pregnancy; Hyperthyroidism; History of Eclampsia in prior pregnancy, currently pregnant; and History of cesarean delivery, antepartum on their problem list.  Patient reports no complaints.  Contractions: Not present. Vag. Bleeding: None.  Movement: Present. Denies leaking of fluid.   The following portions of the patient's history were reviewed and updated as appropriate: allergies, current medications, past family history, past medical history, past social history, past surgical history and problem list.   Objective:   Vitals:   06/18/19 0921  BP: 122/80  Pulse: 91  Weight: 117 lb 6.4 oz (53.3 kg)    Fetal Status: Fetal Heart Rate (bpm): 143   Movement: Present     General:  Alert, oriented and cooperative. Patient is in no acute distress.  Skin: Skin is warm and dry. No rash noted.   Cardiovascular: Normal heart rate noted  Respiratory: Normal respiratory effort, no problems with respiration noted  Abdomen: Soft, gravid, appropriate for gestational age.  Pain/Pressure: Present     Pelvic: Cervical exam deferred       frothy vaginal discharge  Extremities: Normal range of motion.  Edema: None  Mental Status: Normal mood and affect. Normal behavior. Normal judgment and thought content.   Assessment and Plan:  Pregnancy: G2P0101 at [redacted]w[redacted]d 1. Supervision of high risk pregnancy in second trimester - Glucose Tolerance, 2 Hours w/1 Hour; Future - CBC; Future - RPR; Future - HIV Antibody (routine testing w rflx); Future  2. History of pre-eclampsia in prior pregnancy, currently pregnant - taking baby asa  daily  3. History of cesarean delivery, antepartum - would like a TOLAC - sign consent today  4. Hyperthyroidism  - T3, free; Future - T4, free; Future - TSH; Future  5. Trich in pregnancy - TOC today  Preterm labor symptoms and general obstetric precautions including but not limited to vaginal bleeding, contractions, leaking of fluid and fetal movement were reviewed in detail with the patient. Please refer to After Visit Summary for other counseling recommendations.   Return for tomorrow for 2 hour GTT and labs, next visit 2 weeks virtual.  Future Appointments  Date Time Provider [place]  06/19/2019  9:15 AM [place] [facility]-US  06/19/2019  9:15 AM WH-MFC [place] 4 WH-MFCUS MFC-US  06/19/2019 10:45 AM WH-MFC NST [place] MFC-US  06/26/2019  9:15 AM WH-MFC NURSE WH-MFC MFC-US  06/26/2019  9:15 AM WH-MFC [place] 4 WH-MFCUS MFC-US  06/26/2019 10:45 AM WH-MFC NST [place] MFC-US  07/03/2019  9:15 AM WH-MFC NURSE WH-MFC MFC-US  07/03/2019  9:15 AM WH-MFC [place] 4 WH-MFCUS MFC-US  07/03/2019 10:45 AM WH-MFC NST WH-MFC MFC-US    Emily Filbert, MD

## 2019-06-19 ENCOUNTER — Ambulatory Visit (HOSPITAL_COMMUNITY): Payer: Medicaid Other

## 2019-06-19 ENCOUNTER — Ambulatory Visit (HOSPITAL_COMMUNITY): Payer: Medicaid Other | Admitting: *Deleted

## 2019-06-19 ENCOUNTER — Ambulatory Visit (HOSPITAL_COMMUNITY)
Admission: RE | Admit: 2019-06-19 | Discharge: 2019-06-19 | Disposition: A | Discharge status: Home / Self Care | Payer: Medicaid Other | Source: Ambulatory Visit | Attending: Obstetrics and Gynecology | Admitting: Obstetrics and Gynecology

## 2019-06-19 ENCOUNTER — Other Ambulatory Visit: Payer: Medicaid Other

## 2019-06-19 ENCOUNTER — Encounter (HOSPITAL_COMMUNITY): Payer: Self-pay

## 2019-06-19 VITALS — BP 114/64 | HR 61 | Temp 98.1°F

## 2019-06-19 DIAGNOSIS — O0992 Supervision of high risk pregnancy, unspecified, second trimester: Secondary | ICD-10-CM

## 2019-06-19 DIAGNOSIS — E0590 Thyrotoxicosis, unspecified without thyrotoxic crisis or storm: Secondary | ICD-10-CM | POA: Diagnosis not present

## 2019-06-19 DIAGNOSIS — O365930 Maternal care for other known or suspected poor fetal growth, third trimester, not applicable or unspecified: Secondary | ICD-10-CM

## 2019-06-19 DIAGNOSIS — O09293 Supervision of pregnancy with other poor reproductive or obstetric history, third trimester: Secondary | ICD-10-CM

## 2019-06-19 DIAGNOSIS — O99283 Endocrine, nutritional and metabolic diseases complicating pregnancy, third trimester: Secondary | ICD-10-CM | POA: Diagnosis not present

## 2019-06-19 DIAGNOSIS — O34219 Maternal care for unspecified type scar from previous cesarean delivery: Secondary | ICD-10-CM | POA: Diagnosis not present

## 2019-06-19 DIAGNOSIS — Z3A30 30 weeks gestation of pregnancy: Secondary | ICD-10-CM

## 2019-06-19 DIAGNOSIS — O09213 Supervision of pregnancy with history of pre-term labor, third trimester: Secondary | ICD-10-CM | POA: Diagnosis not present

## 2019-06-19 LAB — CERVICOVAGINAL ANCILLARY ONLY
Bacterial vaginitis: POSITIVE — AB
Candida vaginitis: NEGATIVE
Trichomonas: POSITIVE — AB

## 2019-06-20 ENCOUNTER — Encounter: Payer: Self-pay | Admitting: Family Medicine

## 2019-06-20 ENCOUNTER — Other Ambulatory Visit: Payer: Self-pay | Admitting: Obstetrics & Gynecology

## 2019-06-20 ENCOUNTER — Other Ambulatory Visit: Payer: Self-pay | Admitting: *Deleted

## 2019-06-20 LAB — GLUCOSE TOLERANCE, 2 HOURS W/ 1HR
Glucose, 1 hour: 135 mg/dL (ref 65–179)
Glucose, 2 hour: 53 mg/dL — ABNORMAL LOW (ref 65–152)
Glucose, Fasting: 84 mg/dL (ref 65–91)

## 2019-06-20 LAB — CBC
Hematocrit: 37.5 % (ref 34.0–46.6)
Hemoglobin: 12.7 g/dL (ref 11.1–15.9)
MCH: 30.6 pg (ref 26.6–33.0)
MCHC: 33.9 g/dL (ref 31.5–35.7)
MCV: 90 fL (ref 79–97)
Platelets: 250 10*3/uL (ref 150–450)
RBC: 4.15 x10E6/uL (ref 3.77–5.28)
RDW: 12.5 % (ref 11.7–15.4)
WBC: 12.5 10*3/uL — ABNORMAL HIGH (ref 3.4–10.8)

## 2019-06-20 LAB — T4, FREE: Free T4: 1.68 ng/dL (ref 0.82–1.77)

## 2019-06-20 LAB — T3, FREE: T3, Free: 2.8 pg/mL (ref 2.0–4.4)

## 2019-06-20 LAB — HIV ANTIBODY (ROUTINE TESTING W REFLEX): HIV Screen 4th Generation wRfx: NONREACTIVE

## 2019-06-20 LAB — RPR: RPR Ser Ql: NONREACTIVE

## 2019-06-20 LAB — TSH: TSH: 1.59 u[IU]/mL (ref 0.450–4.500)

## 2019-06-20 MED ORDER — TINIDAZOLE 500 MG PO TABS: 2000.0000 mg | ORAL_TABLET | Freq: Once | ORAL | 0 refills | Status: AC

## 2019-06-20 MED ORDER — METRONIDAZOLE 500 MG PO TABS: ORAL_TABLET | ORAL | 0 refills | Status: DC

## 2019-06-20 NOTE — Progress Notes (Unsigned)
Flagyl prescribed for recurrent trich

## 2019-06-20 NOTE — Progress Notes (Signed)
Pt sent MyChart message stating that she cannot tolerate Metronidazole which was ordered for +trich infection. Tindamax e-prescribed per standing order and pt was notified.

## 2019-06-25 ENCOUNTER — Other Ambulatory Visit: Payer: Self-pay

## 2019-06-25 ENCOUNTER — Encounter (HOSPITAL_COMMUNITY): Payer: Self-pay | Admitting: *Deleted

## 2019-06-25 ENCOUNTER — Inpatient Hospital Stay (HOSPITAL_COMMUNITY)
Admission: AD | Admit: 2019-06-25 | Discharge: 2019-06-25 | Disposition: A | Discharge status: Home / Self Care | Payer: Medicaid Other | Attending: Obstetrics and Gynecology | Admitting: Obstetrics and Gynecology

## 2019-06-25 DIAGNOSIS — Z7982 Long term (current) use of aspirin: Secondary | ICD-10-CM | POA: Insufficient documentation

## 2019-06-25 DIAGNOSIS — Z3A31 31 weeks gestation of pregnancy: Secondary | ICD-10-CM | POA: Diagnosis not present

## 2019-06-25 DIAGNOSIS — B373 Candidiasis of vulva and vagina: Secondary | ICD-10-CM | POA: Diagnosis not present

## 2019-06-25 DIAGNOSIS — O98813 Other maternal infectious and parasitic diseases complicating pregnancy, third trimester: Secondary | ICD-10-CM | POA: Diagnosis not present

## 2019-06-25 DIAGNOSIS — O4703 False labor before 37 completed weeks of gestation, third trimester: Secondary | ICD-10-CM

## 2019-06-25 DIAGNOSIS — R109 Unspecified abdominal pain: Secondary | ICD-10-CM | POA: Insufficient documentation

## 2019-06-25 DIAGNOSIS — Z87891 Personal history of nicotine dependence: Secondary | ICD-10-CM | POA: Insufficient documentation

## 2019-06-25 DIAGNOSIS — B3731 Acute candidiasis of vulva and vagina: Secondary | ICD-10-CM

## 2019-06-25 DIAGNOSIS — O479 False labor, unspecified: Secondary | ICD-10-CM | POA: Diagnosis not present

## 2019-06-25 HISTORY — DX: Depression, unspecified: F32.A

## 2019-06-25 LAB — URINALYSIS, ROUTINE W REFLEX MICROSCOPIC
Bilirubin Urine: NEGATIVE
Glucose, UA: NEGATIVE mg/dL
Hgb urine dipstick: NEGATIVE
Ketones, ur: NEGATIVE mg/dL
Nitrite: NEGATIVE
Protein, ur: NEGATIVE mg/dL
Specific Gravity, Urine: 1.011 (ref 1.005–1.030)
pH: 7 (ref 5.0–8.0)

## 2019-06-25 LAB — FETAL FIBRONECTIN: Fetal Fibronectin: NEGATIVE

## 2019-06-25 MED ORDER — NIFEDIPINE 10 MG PO CAPS: 10.0000 mg | ORAL_CAPSULE | Freq: Four times a day (QID) | ORAL | 0 refills | Status: DC | PRN

## 2019-06-25 MED ORDER — NIFEDIPINE 10 MG PO CAPS
10.0000 mg | ORAL_CAPSULE | ORAL | Status: AC | PRN
  Administered 2019-06-25 (×3): 10 mg via ORAL
  Filled 2019-06-25 (×3): qty 1

## 2019-06-25 MED ORDER — TERCONAZOLE 0.8 % VA CREA: 1.0000 | TOPICAL_CREAM | Freq: Every day | VAGINAL | 0 refills | Status: DC

## 2019-06-25 NOTE — Discharge Instructions (Signed)
Fetal Movement Counts Patient Name: ________________________________________________ Patient Due Date: ____________________ What is a fetal movement count?  A fetal movement count is the number of times that you feel your baby move during a certain amount of time. This may also be called a fetal kick count. A fetal movement count is recommended for every pregnant woman. You may be asked to start counting fetal movements as early as week 28 of your pregnancy. Pay attention to when your baby is most active. You may notice your baby's sleep and wake cycles. You may also notice things that make your baby move more. You should do a fetal movement count:  When your baby is normally most active.  At the same time each day. A good time to count movements is while you are resting, after having something to eat and drink. How do I count fetal movements? 1. Find a quiet, comfortable area. Sit, or lie down on your side. 2. Write down the date, the start time and stop time, and the number of movements that you felt between those two times. Take this information with you to your health care visits. 3. For 2 hours, count kicks, flutters, swishes, rolls, and jabs. You should feel at least 10 movements during 2 hours. 4. You may stop counting after you have felt 10 movements. 5. If you do not feel 10 movements in 2 hours, have something to eat and drink. Then, keep resting and counting for 1 hour. If you feel at least 4 movements during that hour, you may stop counting. Contact a health care provider if:  You feel fewer than 4 movements in 2 hours.  Your baby is not moving like he or she usually does. Date: ____________ Start time: ____________ Stop time: ____________ Movements: ____________ Date: ____________ Start time: ____________ Stop time: ____________ Movements: ____________ Date: ____________ Start time: ____________ Stop time: ____________ Movements: ____________ Date: ____________ Start time:  ____________ Stop time: ____________ Movements: ____________ Date: ____________ Start time: ____________ Stop time: ____________ Movements: ____________ Date: ____________ Start time: ____________ Stop time: ____________ Movements: ____________ Date: ____________ Start time: ____________ Stop time: ____________ Movements: ____________ Date: ____________ Start time: ____________ Stop time: ____________ Movements: ____________ Date: ____________ Start time: ____________ Stop time: ____________ Movements: ____________ This information is not intended to replace advice given to you by your health care provider. Make sure you discuss any questions you have with your health care provider. Document Released: 12/07/2006 Document Revised: 11/27/2018 Document Reviewed: 12/17/2015 Elsevier Patient Education  2020 [employer]. Preterm Labor and Birth Information  The normal length of a pregnancy is 39-41 weeks. Preterm labor is when labor starts before 37 completed weeks of pregnancy. What are the risk factors for preterm labor? Preterm labor is more likely to occur in women who:  Have certain infections during pregnancy such as a bladder infection, sexually transmitted infection, or infection inside the uterus (chorioamnionitis).  Have a shorter-than-normal cervix.  Have gone into preterm labor before.  Have had surgery on their cervix.  Are younger than age 72 or older than age 26.  Are African American.  Are pregnant with twins or multiple babies (multiple gestation).  Take street drugs or smoke while pregnant.  Do not gain enough weight while pregnant.  Became pregnant shortly after having been pregnant. What are the symptoms of preterm labor? Symptoms of preterm labor include:  Cramps similar to those that can happen during a menstrual period. The cramps may happen with diarrhea.  Pain in the abdomen or lower  back.  Regular uterine contractions that may feel like tightening of the  abdomen.  A feeling of increased pressure in the pelvis.  Increased watery or bloody mucus discharge from the vagina.  Water breaking (ruptured amniotic sac). Why is it important to recognize signs of preterm labor? It is important to recognize signs of preterm labor because babies who are born prematurely may not be fully developed. This can put them at an increased risk for:  Long-term (chronic) heart and lung problems.  Difficulty immediately after birth with regulating body systems, including blood sugar, body temperature, heart rate, and breathing rate.  Bleeding in the brain.  Cerebral palsy.  Learning difficulties.  Death. These risks are highest for babies who are born before 16 weeks of pregnancy. How is preterm labor treated? Treatment depends on the length of your pregnancy, your condition, and the health of your baby. It may involve:  Having a stitch (suture) placed in your cervix to prevent your cervix from opening too early (cerclage).  Taking or being given medicines, such as: ? Hormone medicines. These may be given early in pregnancy to help support the pregnancy. ? Medicine to stop contractions. ? Medicines to help mature the babys lungs. These may be prescribed if the risk of delivery is high. ? Medicines to prevent your baby from developing cerebral palsy. If the labor happens before 34 weeks of pregnancy, you may need to stay in the hospital. What should I do if I think I am in preterm labor? If you think that you are going into preterm labor, call your health care provider right away. How can I prevent preterm labor in future pregnancies? To increase your chance of having a full-term pregnancy:  Do not use any tobacco products, such as cigarettes, chewing tobacco, and e-cigarettes. If you need help quitting, ask your health care provider.  Do not use street drugs or medicines that have not been prescribed to you during your pregnancy.  Talk with your  health care provider before taking any herbal supplements, even if you have been taking them regularly.  Make sure you gain a healthy amount of weight during your pregnancy.  Watch for infection. If you think that you might have an infection, get it checked right away.  Make sure to tell your health care provider if you have gone into preterm labor before. This information is not intended to replace advice given to you by your health care provider. Make sure you discuss any questions you have with your health care provider. Document Released: 01/28/2004 Document Revised: 03/01/2019 Document Reviewed: 03/30/2016 Elsevier Patient Education  2020 [employer].

## 2019-06-25 NOTE — MAU Provider Note (Signed)
Chief Complaint:  Abdominal Pain   First Provider Initiated Contact with Patient 06/25/19 2035     HPI: Kaitlyn Hunt is a 28 y.o. G2P0101 at [redacted]w[redacted]d who presents to maternity admissions via EMS reporting contractions. Started over a week ago but noticed them more this morning. States at time she feels like she has them every 15 minutes. Denies n/v/d, dysuria, vaginal bleeding, or LOF. Has not had intercourse in over 3 weeks. Took treatment for trichomonas last Thursday.  Good fetal movement.   Past Medical History:  Diagnosis Date  . Amenorrhea 09/23/2014  . 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 ATTENTION DEFICIT, W/HYPERACTIVITY 01/18/2007   Qualifier: Diagnosis of  By: Hoy Morn MD, HEIDI    . Bipolar 1 disorder ([place])    previously on Depakote D/C 5/11 bc pt stated it made her feel lazy   . Depression    not currently taking bipolar meds, "crashes some when a lot is coming at her, stress"  . Eclampsia in pregnancy    one seizure after delivery of first child  . 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 H/O LEEP 10/25/2018  . Hyperthyroidism   . Low TSH level 04/27/2015  . Ovarian mass, right 02/04/2016  . Pregnancy induced hypertension   . PROBLEMS, BEHAVIORAL NEC 08/03/2007   Qualifier: Diagnosis of  By: Hoy Morn MD, HEIDI    . Trichimoniasis   . Vaginal Pap smear, abnormal    OB History  Gravida Para Term Preterm AB Living  2 1   1   1   SAB TAB Ectopic Multiple Live Births          1    # Outcome Date GA Lbr Len/2nd Weight Sex Delivery Anes PTL Lv  2 Current           1 Preterm 02/15/11 [redacted]w[redacted]d  1570 g M  EPI N LIV     Birth Comments: LTCS     Complications: Fetal Intolerance   Past Surgical History:  Procedure Laterality Date  . CESAREAN SECTION  2012   Fetal intolerance following epidural  . LEEP    . WISDOM TOOTH EXTRACTION     Family History  Problem Relation Age of Onset  . Bipolar disorder Mother   . Bipolar disorder Maternal Aunt   . Bipolar  disorder Sister   . Healthy Father    Social History   Tobacco Use  . Smoking status: Former Smoker    Packs/day: 0.50    Types: Cigarettes    Quit date: 12/14/2018    Years since quitting: 0.5  . Smokeless tobacco: Never Used  Substance Use Topics  . Alcohol use: Not Currently  . Drug use: No   No Known Allergies No medications prior to admission.    I have reviewed patient's Past Medical Hx, Surgical Hx, Family Hx, Social Hx, medications and allergies.   ROS:  Review of Systems  Constitutional: Negative.   Gastrointestinal: Positive for abdominal pain. Negative for constipation, diarrhea, nausea and vomiting.  Genitourinary: Negative.     Physical Exam   Patient Vitals for the past 24 hrs:  BP Temp Temp src Pulse Resp  06/25/19 1338 (!) 115/58 - - 75 -  06/25/19 1309 112/66 - - - -  06/25/19 1246 139/83 - - (!) 51 -  06/25/19 1052 115/70 - - 60 -  06/25/19 1047 125/68 98.5 F (36.9 C) Oral (!) 55 16    Constitutional: Well-developed, well-nourished female in no acute distress.  Cardiovascular: normal rate & rhythm, no murmur Respiratory: normal effort, lung sounds clear throughout GI: Abd soft, non-tender, gravid appropriate for gestational age. Pos BS x 4 MS: Extremities nontender, no edema, normal ROM Neurologic: Alert and oriented x 4.  GU:      Pelvic: NEFG, physiologic discharge, no blood, cervix clean.   Dilation: 1.5 Effacement (%): 60 Station: -3 Exam by:: Purcell Nails, NP  NST:  Baseline: 145 bpm, Variability: Good {> 6 bpm), Accelerations: Reactive and Decelerations: Absent   Labs: Results for orders placed or performed during the hospital encounter of 06/25/19 (from the past 24 hour(s))  Urinalysis, Routine w reflex microscopic     Status: Abnormal   Collection Time: 06/25/19 11:01 AM  Result Value Ref Range   Color, Urine YELLOW YELLOW   APPearance CLEAR CLEAR   Specific Gravity, Urine 1.011 1.005 - 1.030   pH 7.0 5.0 - 8.0   Glucose, UA  NEGATIVE NEGATIVE mg/dL   Hgb urine dipstick NEGATIVE NEGATIVE   Bilirubin Urine NEGATIVE NEGATIVE   Ketones, ur NEGATIVE NEGATIVE mg/dL   Protein, ur NEGATIVE NEGATIVE mg/dL   Nitrite NEGATIVE NEGATIVE   Leukocytes,Ua LARGE (A) NEGATIVE   RBC / HPF 0-5 0 - 5 RBC/hpf   WBC, UA 6-10 0 - 5 WBC/hpf   Bacteria, UA RARE (A) NONE SEEN   Squamous Epithelial / LPF 0-5 0 - 5   Mucus PRESENT   Fetal fibronectin     Status: None   Collection Time: 06/25/19 12:04 PM  Result Value Ref Range   Fetal Fibronectin NEGATIVE NEGATIVE    Imaging:  No results found.  MAU Course: Orders Placed This Encounter  Procedures  . Culture, OB Urine  . Urinalysis, Routine w reflex microscopic  . Fetal fibronectin  . Discharge patient   Meds ordered this encounter  Medications  . NIFEdipine (PROCARDIA) capsule 10 mg  . terconazole (TERAZOL 3) 0.8 % vaginal cream    Sig: Place 1 applicator vaginally at bedtime.    Dispense:  20 g    Refill:  0    Order Specific Question:   Supervising Provider    Answer:   Aletha Halim [id number]  . NIFEdipine (PROCARDIA) 10 MG capsule    Sig: Take 1 capsule (10 mg total) by mouth every 6 (six) hours as needed (more than 6 contractions per hour).    Dispense:  30 capsule    Refill:  0    Order Specific Question:   Supervising Provider    Answer:   Aletha Halim [[id number]]    MDM: Reactive NST.  Cervix 1.5/60/ballotable. Treated with oral hydration & procardia 10 mg x 3 doses. Cervix unchanged after nearly 2 hours of monitoring & patient reports resolution of pain. FFN negative. Reviewed with Dr. Ilda Basset. Ok to discharge home with rx procardia prn.   Vaginal discharge on vulva & glove after exam consistent with yeast. Will send home rx for terazol. Swabs not collected today. Patient just took treatment for trich on Thursday.   Assessment: 1. Preterm uterine contractions in third trimester, antepartum   2. Vaginal yeast infection   3. [redacted] weeks gestation of  pregnancy     Plan: Discharge home in stable condition.  Preterm Labor precautions and fetal kick counts Rx terazol Urine culture pending  Follow-up Information    [facility] Follow up.   Specialty: Obstetrics and Gynecology Why: return for worsening symptoms Contact information: [address] [id number] [place] [phone]  Allergies as of 06/25/2019   No Known Allergies     Medication List    STOP taking these medications   metroNIDAZOLE 500 MG tablet Commonly known as: FLAGYL     TAKE these medications   aspirin EC 81 MG tablet Take 1 tablet (81 mg total) by mouth daily. Take after 12 weeks for prevention of preeclampsia later in pregnancy   NIFEdipine 10 MG capsule Commonly known as: Procardia Take 1 capsule (10 mg total) by mouth every 6 (six) hours as needed (more than 6 contractions per hour).   Prenatal Adult Gummy/DHA/FA 0.4-25 MG Chew Chew 0.4 mg by mouth daily.   terconazole 0.8 % vaginal cream Commonly known as: Terazol 3 Place 1 applicator vaginally at bedtime. What changed: additional instructions       Jorje Guild, NP 06/25/2019 4:29 PM

## 2019-06-25 NOTE — MAU Note (Signed)
abd pain, feels like contractions, started a wk ago, every 64min, lasting about 3 min. Denies bleeding or leaking. Hx of cervical CA in 12/19.  Denies GI or GU complaints.

## 2019-06-26 ENCOUNTER — Encounter (HOSPITAL_COMMUNITY): Payer: Self-pay

## 2019-06-26 ENCOUNTER — Ambulatory Visit (HOSPITAL_COMMUNITY): Payer: Medicaid Other | Admitting: *Deleted

## 2019-06-26 ENCOUNTER — Ambulatory Visit (HOSPITAL_COMMUNITY): Payer: Medicaid Other

## 2019-06-26 ENCOUNTER — Other Ambulatory Visit (HOSPITAL_COMMUNITY): Payer: Self-pay | Admitting: *Deleted

## 2019-06-26 ENCOUNTER — Encounter (HOSPITAL_COMMUNITY): Payer: Self-pay | Admitting: *Deleted

## 2019-06-26 ENCOUNTER — Ambulatory Visit (HOSPITAL_COMMUNITY)
Admission: RE | Admit: 2019-06-26 | Discharge: 2019-06-26 | Disposition: A | Discharge status: Home / Self Care | Payer: Medicaid Other | Source: Ambulatory Visit | Attending: Maternal & Fetal Medicine | Admitting: Maternal & Fetal Medicine

## 2019-06-26 DIAGNOSIS — O34219 Maternal care for unspecified type scar from previous cesarean delivery: Secondary | ICD-10-CM

## 2019-06-26 DIAGNOSIS — O365990 Maternal care for other known or suspected poor fetal growth, unspecified trimester, not applicable or unspecified: Secondary | ICD-10-CM

## 2019-06-26 DIAGNOSIS — O09299 Supervision of pregnancy with other poor reproductive or obstetric history, unspecified trimester: Secondary | ICD-10-CM | POA: Insufficient documentation

## 2019-06-26 DIAGNOSIS — O09213 Supervision of pregnancy with history of pre-term labor, third trimester: Secondary | ICD-10-CM

## 2019-06-26 DIAGNOSIS — E0590 Thyrotoxicosis, unspecified without thyrotoxic crisis or storm: Secondary | ICD-10-CM

## 2019-06-26 DIAGNOSIS — O09293 Supervision of pregnancy with other poor reproductive or obstetric history, third trimester: Secondary | ICD-10-CM | POA: Diagnosis not present

## 2019-06-26 DIAGNOSIS — O99283 Endocrine, nutritional and metabolic diseases complicating pregnancy, third trimester: Secondary | ICD-10-CM

## 2019-06-26 DIAGNOSIS — Z3A31 31 weeks gestation of pregnancy: Secondary | ICD-10-CM

## 2019-06-26 DIAGNOSIS — Z148 Genetic carrier of other disease: Secondary | ICD-10-CM

## 2019-06-26 DIAGNOSIS — O365930 Maternal care for other known or suspected poor fetal growth, third trimester, not applicable or unspecified: Secondary | ICD-10-CM | POA: Diagnosis not present

## 2019-06-26 LAB — CULTURE, OB URINE

## 2019-06-28 ENCOUNTER — Encounter (HOSPITAL_COMMUNITY)
Admission: AD | Disposition: A | Discharge status: Home / Self Care | Payer: Self-pay | Source: Home / Self Care | Attending: Obstetrics & Gynecology

## 2019-06-28 ENCOUNTER — Inpatient Hospital Stay (HOSPITAL_COMMUNITY): Payer: Medicaid Other | Admitting: Anesthesiology

## 2019-06-28 ENCOUNTER — Encounter (HOSPITAL_COMMUNITY): Payer: Self-pay | Admitting: *Deleted

## 2019-06-28 ENCOUNTER — Other Ambulatory Visit: Payer: Self-pay

## 2019-06-28 ENCOUNTER — Inpatient Hospital Stay (HOSPITAL_COMMUNITY)
Admission: AD | Admit: 2019-06-28 | Discharge: 2019-07-03 | DRG: 786 | Disposition: A | Discharge status: Home / Self Care | Payer: Medicaid Other | Attending: Obstetrics & Gynecology | Admitting: Obstetrics & Gynecology

## 2019-06-28 DIAGNOSIS — O411230 Chorioamnionitis, third trimester, not applicable or unspecified: Secondary | ICD-10-CM | POA: Diagnosis not present

## 2019-06-28 DIAGNOSIS — O34211 Maternal care for low transverse scar from previous cesarean delivery: Secondary | ICD-10-CM | POA: Diagnosis present

## 2019-06-28 DIAGNOSIS — O42112 Preterm premature rupture of membranes, onset of labor more than 24 hours following rupture, second trimester: Secondary | ICD-10-CM

## 2019-06-28 DIAGNOSIS — O42919 Preterm premature rupture of membranes, unspecified as to length of time between rupture and onset of labor, unspecified trimester: Secondary | ICD-10-CM | POA: Diagnosis present

## 2019-06-28 DIAGNOSIS — Z87891 Personal history of nicotine dependence: Secondary | ICD-10-CM | POA: Diagnosis not present

## 2019-06-28 DIAGNOSIS — Z3A32 32 weeks gestation of pregnancy: Secondary | ICD-10-CM

## 2019-06-28 DIAGNOSIS — Z20828 Contact with and (suspected) exposure to other viral communicable diseases: Secondary | ICD-10-CM | POA: Diagnosis not present

## 2019-06-28 DIAGNOSIS — O98919 Unspecified maternal infectious and parasitic disease complicating pregnancy, unspecified trimester: Secondary | ICD-10-CM

## 2019-06-28 DIAGNOSIS — O479 False labor, unspecified: Secondary | ICD-10-CM | POA: Diagnosis not present

## 2019-06-28 DIAGNOSIS — O42913 Preterm premature rupture of membranes, unspecified as to length of time between rupture and onset of labor, third trimester: Principal | ICD-10-CM | POA: Diagnosis present

## 2019-06-28 DIAGNOSIS — O42113 Preterm premature rupture of membranes, onset of labor more than 24 hours following rupture, third trimester: Secondary | ICD-10-CM | POA: Diagnosis not present

## 2019-06-28 DIAGNOSIS — O365930 Maternal care for other known or suspected poor fetal growth, third trimester, not applicable or unspecified: Secondary | ICD-10-CM | POA: Diagnosis present

## 2019-06-28 DIAGNOSIS — O42013 Preterm premature rupture of membranes, onset of labor within 24 hours of rupture, third trimester: Secondary | ICD-10-CM | POA: Diagnosis not present

## 2019-06-28 DIAGNOSIS — O34219 Maternal care for unspecified type scar from previous cesarean delivery: Secondary | ICD-10-CM | POA: Diagnosis present

## 2019-06-28 DIAGNOSIS — O365990 Maternal care for other known or suspected poor fetal growth, unspecified trimester, not applicable or unspecified: Secondary | ICD-10-CM

## 2019-06-28 HISTORY — DX: Preterm premature rupture of membranes, unspecified as to length of time between rupture and onset of labor, unspecified trimester: O42.919

## 2019-06-28 LAB — WET PREP, GENITAL
Clue Cells Wet Prep HPF POC: NONE SEEN
Sperm: NONE SEEN
Trich, Wet Prep: NONE SEEN
Yeast Wet Prep HPF POC: NONE SEEN

## 2019-06-28 LAB — CBC
HCT: 36.7 % (ref 36.0–46.0)
HCT: 35.2 % — ABNORMAL LOW (ref 36.0–46.0)
Hemoglobin: 12.8 g/dL (ref 12.0–15.0)
Hemoglobin: 12.2 g/dL (ref 12.0–15.0)
MCH: 31.2 pg (ref 26.0–34.0)
MCH: 31 pg (ref 26.0–34.0)
MCHC: 34.9 g/dL (ref 30.0–36.0)
MCHC: 34.7 g/dL (ref 30.0–36.0)
MCV: 89.5 fL (ref 80.0–100.0)
MCV: 89.3 fL (ref 80.0–100.0)
Platelets: 291 10*3/uL (ref 150–400)
Platelets: 249 10*3/uL (ref 150–400)
RBC: 4.1 MIL/uL (ref 3.87–5.11)
RBC: 3.94 MIL/uL (ref 3.87–5.11)
RDW: 13.2 % (ref 11.5–15.5)
RDW: 13.2 % (ref 11.5–15.5)
WBC: 16.6 10*3/uL — ABNORMAL HIGH (ref 4.0–10.5)
WBC: 20 10*3/uL — ABNORMAL HIGH (ref 4.0–10.5)
nRBC: 0.1 % (ref 0.0–0.2)
nRBC: 0 % (ref 0.0–0.2)

## 2019-06-28 LAB — TYPE AND SCREEN
ABO/RH(D): O POS
Antibody Screen: NEGATIVE

## 2019-06-28 LAB — SARS CORONAVIRUS 2 BY RT PCR (HOSPITAL ORDER, PERFORMED IN ~~LOC~~ HOSPITAL LAB): SARS Coronavirus 2: NEGATIVE

## 2019-06-28 LAB — POCT FERN TEST: POCT Fern Test: POSITIVE

## 2019-06-28 SURGERY — Surgical Case
Anesthesia: Spinal

## 2019-06-28 MED ORDER — BETAMETHASONE SOD PHOS & ACET 6 (3-3) MG/ML IJ SUSP
12.0000 mg | INTRAMUSCULAR | Status: AC
  Administered 2019-06-28 – 2019-06-29 (×2): 12 mg via INTRAMUSCULAR
  Filled 2019-06-28 (×2): qty 2

## 2019-06-28 MED ORDER — SODIUM CHLORIDE 0.9 % IV SOLN
2.0000 g | Freq: Four times a day (QID) | INTRAVENOUS | Status: AC
  Administered 2019-06-28 – 2019-06-30 (×8): 2 g via INTRAVENOUS
  Filled 2019-06-28 (×8): qty 2000

## 2019-06-28 MED ORDER — ACETAMINOPHEN 325 MG PO TABS
650.0000 mg | ORAL_TABLET | ORAL | Status: DC | PRN
  Administered 2019-06-30: 650 mg via ORAL
  Filled 2019-06-28: qty 2

## 2019-06-28 MED ORDER — LACTATED RINGERS IV SOLN
INTRAVENOUS | Status: DC
  Administered 2019-06-28 – 2019-07-01 (×3): via INTRAVENOUS

## 2019-06-28 MED ORDER — SOD CITRATE-CITRIC ACID 500-334 MG/5ML PO SOLN
ORAL | Status: AC
  Administered 2019-06-28: 15 mL
  Filled 2019-06-28: qty 15

## 2019-06-28 MED ORDER — ZOLPIDEM TARTRATE 5 MG PO TABS
5.0000 mg | ORAL_TABLET | Freq: Every evening | ORAL | Status: DC | PRN
  Administered 2019-06-29 – 2019-06-30 (×2): 5 mg via ORAL
  Filled 2019-06-28 (×2): qty 1

## 2019-06-28 MED ORDER — LACTATED RINGERS IV SOLN
INTRAVENOUS | Status: DC
  Administered 2019-06-29 – 2019-06-30 (×3): via INTRAVENOUS

## 2019-06-28 MED ORDER — OXYTOCIN BOLUS FROM INFUSION: 500.0000 mL | Freq: Once | INTRAVENOUS | Status: DC

## 2019-06-28 MED ORDER — SOD CITRATE-CITRIC ACID 500-334 MG/5ML PO SOLN
30.0000 mL | ORAL | Status: DC | PRN
  Administered 2019-07-01: 30 mL via ORAL
  Filled 2019-06-28: qty 30

## 2019-06-28 MED ORDER — DOCUSATE SODIUM 100 MG PO CAPS
100.0000 mg | ORAL_CAPSULE | Freq: Every day | ORAL | Status: DC
  Administered 2019-06-29 – 2019-07-02 (×4): 100 mg via ORAL
  Filled 2019-06-28 (×4): qty 1

## 2019-06-28 MED ORDER — CALCIUM CARBONATE ANTACID 500 MG PO CHEW: 2.0000 | CHEWABLE_TABLET | ORAL | Status: DC | PRN

## 2019-06-28 MED ORDER — ASPIRIN EC 81 MG PO TBEC
81.0000 mg | DELAYED_RELEASE_TABLET | Freq: Every day | ORAL | Status: DC
  Administered 2019-06-29 – 2019-07-01 (×3): 81 mg via ORAL
  Filled 2019-06-28 (×5): qty 1

## 2019-06-28 MED ORDER — FENTANYL CITRATE (PF) 100 MCG/2ML IJ SOLN
INTRAMUSCULAR | Status: AC
  Filled 2019-06-28: qty 2

## 2019-06-28 MED ORDER — ONDANSETRON HCL 4 MG/2ML IJ SOLN
4.0000 mg | Freq: Four times a day (QID) | INTRAMUSCULAR | Status: DC | PRN
  Administered 2019-07-01: 4 mg via INTRAVENOUS

## 2019-06-28 MED ORDER — FLEET ENEMA 7-19 GM/118ML RE ENEM: 1.0000 | ENEMA | RECTAL | Status: DC | PRN

## 2019-06-28 MED ORDER — OXYTOCIN 40 UNITS IN NORMAL SALINE INFUSION - SIMPLE MED: 2.5000 [IU]/h | INTRAVENOUS | Status: DC

## 2019-06-28 MED ORDER — LIDOCAINE HCL (PF) 1 % IJ SOLN: 30.0000 mL | INTRAMUSCULAR | Status: DC | PRN

## 2019-06-28 MED ORDER — LACTATED RINGERS IV SOLN: 500.0000 mL | INTRAVENOUS | Status: DC | PRN

## 2019-06-28 MED ORDER — PRENATAL MULTIVITAMIN CH
1.0000 | ORAL_TABLET | Freq: Every day | ORAL | Status: DC
  Administered 2019-06-29 – 2019-07-02 (×3): 1 via ORAL
  Filled 2019-06-28 (×2): qty 1

## 2019-06-28 MED ORDER — AMOXICILLIN 500 MG PO CAPS
500.0000 mg | ORAL_CAPSULE | Freq: Three times a day (TID) | ORAL | Status: DC
  Administered 2019-06-30 – 2019-07-01 (×2): 500 mg via ORAL
  Filled 2019-06-28 (×2): qty 1

## 2019-06-28 MED ORDER — LACTATED RINGERS IV BOLUS
500.0000 mL | Freq: Once | INTRAVENOUS | Status: AC
  Administered 2019-06-28: 500 mL via INTRAVENOUS

## 2019-06-28 MED ORDER — MORPHINE SULFATE (PF) 0.5 MG/ML IJ SOLN
INTRAMUSCULAR | Status: AC
  Filled 2019-06-28: qty 10

## 2019-06-28 MED ORDER — LACTATED RINGERS IV BOLUS: 500.0000 mL | Freq: Once | INTRAVENOUS | Status: DC

## 2019-06-28 NOTE — Progress Notes (Signed)
Dr Hulan Fray called at [facility] d/t repetitive variable decels and increasingly painful UCs 4-10 min apart. Dr Hulan Fray came to dept after reviewing FHR strip. Will prep pt for possible C/S and transfer to L&D.

## 2019-06-28 NOTE — MAU Note (Signed)
EMS Arrival. Stated she has been having ctx all day . Water broke about 15 min ago clear fluid.. Good fetal movement reported.

## 2019-06-28 NOTE — H&P (Signed)
Kaitlyn Hunt is a 28 y.o. female presenting for PPROM. Was seen for preterm contractions earlier this week. Cervix was 1.5 cm dilated at the time, FFN negative; sent home with procardia. States the contractions have continued to worsen & this afternoon her water broke around 4 pm. Reports large gush of clear fluid. No vaginal bleeding or fever. Last took procardia this morning. Good fetal movement. PPROM confirmed by SSE in MAU. Cervical exam performed due to patient's reports of worsening contractions; cervix unchanged from 8/4.  Hx of 34 week c/section for eclampsia & fetal distress. Would like to TOLAC.  Current pregnancy complicated by severe IUGR. Last doppler study was 8/5 & was normal. EFW as of 7/22 was <1%. Treated for trichomonas over a week ago; has not had intercourse since then.   OB History    Gravida  2   Para  1   Term      Preterm  1   AB      Living  1     SAB      TAB      Ectopic      Multiple      Live Births  1          Past Medical History:  Diagnosis Date  . Amenorrhea 09/23/2014  . 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 ATTENTION DEFICIT, W/HYPERACTIVITY 01/18/2007   Qualifier: Diagnosis of  By: Hoy Morn MD, HEIDI    . Bipolar 1 disorder ([place])    previously on Depakote D/C 5/11 bc pt stated it made her feel lazy   . Depression    not currently taking bipolar meds, "crashes some when a lot is coming at her, stress"  . Eclampsia in pregnancy    one seizure after delivery of first child  . 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 H/O LEEP 10/25/2018  . Hyperthyroidism   . Low TSH level 04/27/2015  . Ovarian mass, right 02/04/2016  . Pregnancy induced hypertension   . PROBLEMS, BEHAVIORAL NEC 08/03/2007   Qualifier: Diagnosis of  By: Hoy Morn MD, HEIDI    . Trichimoniasis   . Vaginal Pap smear, abnormal    Past Surgical History:  Procedure Laterality Date  . CESAREAN SECTION  2012   Fetal intolerance following epidural  .  LEEP    . WISDOM TOOTH EXTRACTION     Family History: family history includes Bipolar disorder in her maternal aunt, mother, and sister; Healthy in her father. Social History:  reports that she quit smoking about 6 months ago. Her smoking use included cigarettes. She smoked 0.50 packs per day. She has never used smokeless tobacco. She reports previous alcohol use. She reports that she does not use drugs.     Maternal Diabetes: No Genetic Screening: Normal Maternal Ultrasounds/Referrals: IUGR Fetal Ultrasounds or other Referrals:  Referred to Materal Fetal Medicine  Maternal Substance Abuse:  No Significant Maternal Medications:  None Significant Maternal Lab Results:  None Other Comments:  None  Review of Systems  Constitutional: Negative.   Gastrointestinal: Positive for abdominal pain.  Genitourinary:       + vaginal discharge No vaginal bleeding   Maternal Medical History:  Reason for admission: Rupture of membranes.   Fetal activity: Perceived fetal activity is normal.    Prenatal complications: IUGR.     Dilation: 1.5 Effacement (%): 60 Station: -3 Exam by:: Jorje Guild NP Blood pressure 128/73, pulse 64, temperature 98.1 F (36.7 C), last menstrual period 11/16/2018.  Maternal Exam:  Abdomen: Patient reports no abdominal tenderness. Surgical scars: low transverse.   Fetal presentation: vertex  Introitus: Normal vulva. Ferning test: positive.  Nitrazine test: not done. Amniotic fluid character: clear.  Cervix: Cervix evaluated by digital exam.     Fetal Exam Fetal Monitor Review: Baseline rate: 150.  Variability: moderate (6-25 bpm).   Pattern: accelerations present and variable decelerations.       Physical Exam  Nursing note and vitals reviewed. Constitutional: She appears well-developed and well-nourished. No distress.  HENT:  Head: Normocephalic and atraumatic.  Cardiovascular: Normal rate and regular rhythm.  Respiratory: Effort normal. No  respiratory distress.  GI: Soft. There is no abdominal tenderness.  Genitourinary:    Vulva normal.  There is no lesion on the right labia. There is no lesion on the left labia.    No vaginal bleeding.  No bleeding in the vagina.    Genitourinary Comments: + pooling moderate amount of clear fluid   Skin: She is not diaphoretic.    Prenatal labs: ABO, Rh: --/--/O POS Performed at [facility], [address] [place], [place] [address]  [phone]/27 1036) Antibody:   Rubella: 3.26 (02/04 1026) RPR: Non Reactive (07/29 0823)  HBsAg: Negative (02/04 1026)  HIV: Non Reactive (07/29 0823)  GBS:  pending  Assessment/Plan: 1. Preterm premature rupture of membranes (PPROM) with unknown onset of labor   2. [redacted] weeks gestation of pregnancy   3. Pregnancy affected by fetal growth restriction    -admit to [facility] unit -CEFM/TOCO -IV antibiotics for PPROM -GBS culture pending -GC/CT pending -Trich TOC negative -BMZ series -NICU consult ordered   Jorje Guild 06/28/2019, 5:05 PM

## 2019-06-28 NOTE — Progress Notes (Signed)
Patient ID: Kaitlyn Hunt, female   DOB: December 04, 1990, 28 y.o.   MRN: [id number] I was called to evaluate the fetal heart rate. There was good variability however there were recurrent variables with every contraction, some with a late component. I went ahead and discussed the possibilty of a Repeat c/s and the risks and benefits. Consent form was signed. I was called to another patient's room with fetal bradycardia. In the interim, Ms. Cumming's baby's strip improved. She will be transferred to L&D.

## 2019-06-28 NOTE — Anesthesia Preprocedure Evaluation (Deleted)
Anesthesia Evaluation  Patient identified by MRN, date of birth, ID band Patient awake    Reviewed: Allergy & Precautions, NPO status , Patient's Chart, lab work & pertinent test results  Airway Mallampati: II  TM Distance: >3 FB Neck ROM: Full    Dental no notable dental hx.    Pulmonary neg pulmonary ROS, former smoker,    Pulmonary exam normal breath sounds clear to auscultation       Cardiovascular hypertension, Normal cardiovascular exam Rhythm:Regular Rate:Normal     Neuro/Psych negative neurological ROS  negative psych ROS   GI/Hepatic negative GI ROS, Neg liver ROS,   Endo/Other  negative endocrine ROS  Renal/GU negative Renal ROS  negative genitourinary   Musculoskeletal negative musculoskeletal ROS (+)   Abdominal   Peds negative pediatric ROS (+)  Hematology negative hematology ROS (+)   Anesthesia Other Findings   Reproductive/Obstetrics (+) Pregnancy (PIH, IUGR, h/o ecclampsia)                             Anesthesia Physical Anesthesia Plan  ASA: II  Anesthesia Plan: Spinal   Post-op Pain Management:    Induction: Intravenous  PONV Risk Score and Plan: 2 and Treatment may vary due to age or medical condition  Airway Management Planned: Natural Airway  Additional Equipment:   Intra-op Plan:   Post-operative Plan:   Informed Consent: I have reviewed the patients History and Physical, chart, labs and discussed the procedure including the risks, benefits and alternatives for the proposed anesthesia with the patient or authorized representative who has indicated his/her understanding and acceptance.     Dental advisory given  Plan Discussed with: CRNA  Anesthesia Plan Comments:         Anesthesia Quick Evaluation

## 2019-06-29 DIAGNOSIS — O42113 Preterm premature rupture of membranes, onset of labor more than 24 hours following rupture, third trimester: Secondary | ICD-10-CM | POA: Diagnosis not present

## 2019-06-29 LAB — COMPREHENSIVE METABOLIC PANEL
ALT: 12 U/L (ref 0–44)
AST: 16 U/L (ref 15–41)
Albumin: 2.7 g/dL — ABNORMAL LOW (ref 3.5–5.0)
Alkaline Phosphatase: 118 U/L (ref 38–126)
Anion gap: 9 (ref 5–15)
BUN: 5 mg/dL — ABNORMAL LOW (ref 6–20)
CO2: 21 mmol/L — ABNORMAL LOW (ref 22–32)
Calcium: 9.1 mg/dL (ref 8.9–10.3)
Chloride: 107 mmol/L (ref 98–111)
Creatinine, Ser: 0.64 mg/dL (ref 0.44–1.00)
GFR calc Af Amer: >60 mL/min (ref >60)
GFR calc non Af Amer: >60 mL/min (ref >60)
Glucose, Bld: 115 mg/dL — ABNORMAL HIGH (ref 70–99)
Potassium: 4 mmol/L (ref 3.5–5.1)
Sodium: 137 mmol/L (ref 135–145)
Total Bilirubin: 0.2 mg/dL — ABNORMAL LOW (ref 0.3–1.2)
Total Protein: 6.2 g/dL — ABNORMAL LOW (ref 6.5–8.1)

## 2019-06-29 LAB — ABO/RH: ABO/RH(D): O POS

## 2019-06-29 LAB — RPR: RPR Ser Ql: NONREACTIVE

## 2019-06-29 MED ORDER — BUTORPHANOL TARTRATE 1 MG/ML IJ SOLN: 1.0000 mg | INTRAMUSCULAR | Status: DC | PRN

## 2019-06-29 MED ORDER — AZITHROMYCIN 500 MG PO TABS
1000.0000 mg | ORAL_TABLET | Freq: Once | ORAL | Status: AC
  Administered 2019-06-29: 1000 mg via ORAL
  Filled 2019-06-29: qty 2

## 2019-06-29 MED ORDER — BUTORPHANOL TARTRATE 1 MG/ML IJ SOLN
1.0000 mg | INTRAMUSCULAR | Status: DC | PRN
  Administered 2019-06-29 (×2): 1 mg via INTRAVENOUS
  Filled 2019-06-29 (×2): qty 1

## 2019-06-29 MED ORDER — BUTALBITAL-APAP-CAFFEINE 50-325-40 MG PO TABS
1.0000 | ORAL_TABLET | Freq: Once | ORAL | Status: AC
  Administered 2019-06-30: 1 via ORAL
  Filled 2019-06-29: qty 1

## 2019-06-29 NOTE — Consult Note (Signed)
Asked by Dr.Dove to provide prenatal consultation for patient at risk for preterm delivery due to PROM at [redacted] wks EGA.  Mother is 29 y.o. G2 P1 who is now 32.[redacted] weeks EGA, with pregnancy complicated by severe IUGR (EFW < 1st %tile).  She is being treated with betamethasone and antibiotics pending GBS results. Previous pregnancy delivered via C/section at 34 wks due to eclampsia and fetal distress.  Discussed usual expectations for preterm infant at [redacted] weeks gestation, including possible needs for DR resuscitation, respiratory support, IV access, and temperature support. Projected possible length of stay in NICU until 37 - [redacted] wks EGA. Discussed advantages of feeding with mother's milk.  She plans to pump postnatally.  Patient was attentive, had appropriate questions, and was appreciative of my input.  Thank you for consulting Neonatology.  Total time 20 minutes.  JWimmer, MD

## 2019-06-29 NOTE — Progress Notes (Signed)
Kaitlyn Hunt is a 28 y.o. G2P0101 at [redacted]w[redacted]d admitted to [place] care 06/28/19 for PPROM with cervical dilation to 1.5. She was subsequently transferred to L&D due to report of recurrent variable decelerations and patient report of painful contractions.  Subjective: Patient reports sharp RLQ pain 8-9/10 just as CNM was entering room. Sharp, occurred once then resolved within a few seconds. Eating [place] brought by friend when CNM arrived. Patient continues to desire TOLAC.  Objective: BP 115/62   Pulse 65   Temp 98.1 F (36.7 C) (Oral)   Resp 18   Ht 5\' 3"  (1.6 m)   Wt 53.1 kg   LMP 11/16/2018   SpO2 100%   BMI 20.73 kg/m  I/O last 3 completed shifts: In: 477.5 [I.V.:377.5; IV Piggyback:100] Out: -  No intake/output data recorded.  FHT:  FHR: 145 bpm, variability: moderate,  accelerations:  Present,  decelerations:  Absent UC:   none, irregular, every 90 seconds SVE:   Dilation: 1.5 Effacement (%): 60 Station: -3 Exam by:: [name], CNM  Labs: Lab Results  Component Value Date   WBC 20.0 (H) 06/28/2019   HGB 12.2 06/28/2019   HCT 35.2 (L) 06/28/2019   MCV 89.3 06/28/2019   PLT 249 06/28/2019    Assessment / Plan: 28 y.o. G2P0101 at [redacted]w[redacted]d  PPROM 06/28/19 at 1600 hours Desires TOLAC Cervix checked due to patient report of new onset sharp pain x 1 Cervix unchanged from yesterday afternoon, remains 1.5/thick Discussed justification for eating precautions, reinforced clear liquid diet Patient very collaborative and understanding Anticipate NSVD  [name], CNM 06/29/2019, 9:37 AM

## 2019-06-29 NOTE — Progress Notes (Addendum)
Kaitlyn Hunt is a 28 y.o. G2P0101 at [redacted]w[redacted]d   Subjective: Patient expressing frustration that she is still pregnant. Verbalizing "let's get it over with" throughout the afternoon.  Objective: BP (!) 120/59   Pulse 60   Temp 98.2 F (36.8 C) (Oral)   Resp 14   Ht 5\' 3"  (1.6 m)   Wt 53.1 kg   LMP 11/16/2018   SpO2 100%   BMI 20.73 kg/m  I/O last 3 completed shifts: In: 477.5 [I.V.:377.5; IV Piggyback:100] Out: -  No intake/output data recorded.  FHT:  FHR: 155 bpm, variability: moderate,  accelerations:  Present,  decelerations:  Present prolonged UC:   none SVE:   Dilation: 1.5 Effacement (%): 60 Station: -3 Exam by:: [name], CNM  Labs: Lab Results  Component Value Date   WBC 20.0 (H) 06/28/2019   HGB 12.2 06/28/2019   HCT 35.2 (L) 06/28/2019   MCV 89.3 06/28/2019   PLT 249 06/28/2019    Assessment / Plan: Tracing reviewed with bedside RN and Dr. Kennon Rounds Patient to stay on L&D BMZ 2 of 2 given at 1738 Patient report of intermittent abdominal discomfort consistently correlates to fetal movement  [name], CNM 06/29/2019, 6:28 PM

## 2019-06-29 NOTE — Progress Notes (Signed)
Pt stated she had a sharp, 8-9/10 pain right before RN came into room.  Could not describe if the pain felt like cramping or contractions at all.  Pt let RN know that she could feel a similar pain coming on.  RN palpated in the spot where pt pointed out the pain and at the fundus.  No contraction palpated, only fetal movement at the sight of pain.

## 2019-06-29 NOTE — Progress Notes (Signed)
LABOR PROGRESS NOTE  Kaitlyn Hunt is a 28 y.o. G2P0101 at [redacted]w[redacted]d  admitted for observation for PPROM and occasional decels.   Subjective: Strip note. Discussed plan of care with RN.   Patient reporting headache.   Objective: BP 118/61   Pulse 65   Temp 98.4 F (36.9 C) (Axillary)   Resp (!) 22   Ht 5\' 3"  (1.6 m)   Wt 53.1 kg   LMP 11/16/2018   SpO2 100%   BMI 20.73 kg/m  or  Vitals:   06/29/19 1509 06/29/19 1615 06/29/19 1705 06/29/19 1938  BP: 115/62 (!) 120/59 124/60 118/61  Pulse: 60 60 (!) 59 65  Resp: 16 14 16  (!) 22  Temp: 98.2 F (36.8 C)  98.3 F (36.8 C) 98.4 F (36.9 C)  TempSrc: Oral  Oral Axillary  SpO2:      Weight:      Height:       Dilation: 1.5 Effacement (%): 60 Cervical Position: Posterior Station: -3 Presentation: Vertex Exam by:: [name], CNM FHT: baseline rate 140, moderate varibility, +acel, variable and prolonged decel Toco: None   Labs: Lab Results  Component Value Date   WBC 20.0 (H) 06/28/2019   HGB 12.2 06/28/2019   HCT 35.2 (L) 06/28/2019   MCV 89.3 06/28/2019   PLT 249 06/28/2019    Patient Active Problem List   Diagnosis Date Noted  . Preterm premature rupture of membranes (PPROM) with unknown onset of labor 06/28/2019  . Preterm premature rupture of membranes (PPROM) with onset of labor within 24 hours of rupture in first trimester, antepartum 06/28/2019  . Pregnancy affected by fetal growth restriction 06/18/2019  . History of Eclampsia in prior pregnancy, currently pregnant 01/29/2019  . History of cesarean delivery, antepartum 01/29/2019  . Supervision of high-risk pregnancy 12/14/2018  . Hyperthyroidism 12/14/2018  . CIN III (cervical intraepithelial neoplasia grade III) with severe dysplasia 01/22/2016  . Bipolar disorder ([place]) 10/09/2013  . SYSTOLIC MURMUR 78/67/6720  . GOITER, UNSPECIFIED 10/31/2007    Assessment / Plan: 28 y.o. G2P0101 at [redacted]w[redacted]d here for PPROM. BMZ x2 given. Patient reporting  headache. While BPs stable, given history of eclampsia with prior pregnancy will obtain [place] labs. Fioricet ordered.   Labor: Observation for FHT. No contractions present.  Fetal Wellbeing:  Cat II with Cat I strip between decels  Pain Control:  Stadol prn   Phill Myron, D.O. OB Fellow  06/29/2019, 9:04 PM

## 2019-06-29 NOTE — Progress Notes (Signed)
Kaitlyn Hunt is a 28 y.o. G2P0101 at [redacted]w[redacted]d   Subjective: Resting comfortably in bed, on the phone with supportive friends/family. Denies pain since this morning.  Objective: BP (!) 111/50   Pulse 66   Temp 98.1 F (36.7 C) (Oral)   Resp 14   Ht 5\' 3"  (1.6 m)   Wt 53.1 kg   LMP 11/16/2018   SpO2 100%   BMI 20.73 kg/m  I/O last 3 completed shifts: In: 477.5 [I.V.:377.5; IV Piggyback:100] Out: -  No intake/output data recorded.  FHT:  FHR: 145 bpm, variability: moderate,  accelerations:  Present,  decelerations:  Absent UC:   X 1 since last exam, UI noted SVE:   Dilation: 1.5 Effacement (%): 60 Station: -3 Exam by:: [name], CNM  Labs: Lab Results  Component Value Date   WBC 20.0 (H) 06/28/2019   HGB 12.2 06/28/2019   HCT 35.2 (L) 06/28/2019   MCV 89.3 06/28/2019   PLT 249 06/28/2019    Assessment / Plan: 28 y.o. G2P0101 at [redacted]w[redacted]d  Category I tracing Pt denies pain SVE not reaccomplished   [name], CNM 06/29/2019, 1:55 PM

## 2019-06-30 DIAGNOSIS — O42013 Preterm premature rupture of membranes, onset of labor within 24 hours of rupture, third trimester: Secondary | ICD-10-CM

## 2019-06-30 DIAGNOSIS — Z3A32 32 weeks gestation of pregnancy: Secondary | ICD-10-CM

## 2019-06-30 LAB — CULTURE, BETA STREP (GROUP B ONLY)

## 2019-06-30 MED ORDER — HYDROXYZINE HCL 25 MG PO TABS
25.0000 mg | ORAL_TABLET | Freq: Three times a day (TID) | ORAL | Status: DC | PRN
  Administered 2019-06-30: 25 mg via ORAL
  Filled 2019-06-30 (×2): qty 1

## 2019-06-30 MED ORDER — SERTRALINE HCL 50 MG PO TABS
25.0000 mg | ORAL_TABLET | Freq: Every day | ORAL | Status: DC
  Administered 2019-06-30 – 2019-07-03 (×4): 25 mg via ORAL
  Filled 2019-06-30 (×4): qty 1

## 2019-06-30 NOTE — Progress Notes (Signed)
Pt is once again agitated with the POC and is now requesting to go home.  When RN enters the room patient is on the phone with her sister.  Pt is angry and continues to feel that her baby needs to be born today.  She mentions that she has not felt the baby move as much today, when monitoring is recommended pt refuses because "what will a monitor tell me, nothing!?"  Pt educated on standard fetal monitoring benefits and is told that she can requiest to be put on the monitor, outsides of ordered times if she feels like she needs it.    Pt is then questioned as to who she feels we want to keep her pregnant and what benefit she feels there is for [place] in not delivering her today.  Pt states she doesn't know.  Patients sister is on the phone and encourages her sister to consider the POC that has been put in place, then suggests that some of this anxiety might be from her bipolar diagnosis.  Patient agrees that she is usually on medication but has not been since pregnancy.  Pt agrees to medication both to help with manic depression and to help sleep.    After discussing again the benefits of staying pregnant at this time, and the risks with both SROM and PTD at 32.2 weeks, pt agrees to monitoring at this time.  Pt given the option to request to stay on longer if it is helpful for her.    MD notified that pt did not sign AMA papers and that she is currently on monitor.

## 2019-06-30 NOTE — Progress Notes (Signed)
Patient ID: Kaitlyn Hunt, female   DOB: 12/17/1990, 28 y.o.   MRN: [id number] [place]) NOTE  Kaitlyn Hunt is a 28 y.o. G2P0101 at [redacted]w[redacted]d by best clinical estimate who is admitted for PROM.   Fetal presentation is cephalic. Length of Stay:  2  Days  Subjective: Still leaking clear fluid. No repetitive decels Patient reports the fetal movement as active. Patient reports uterine contraction  activity as none. Patient reports  vaginal bleeding as none. Patient describes fluid per vagina as Clear.  Vitals:  Blood pressure (!) 108/53, pulse 73, temperature 98.4 F (36.9 C), temperature source Oral, resp. rate 16, height 5\' 3"  (1.6 m), weight 53.1 kg, last menstrual period 11/16/2018, SpO2 100 %. Physical Examination:  General appearance - alert, well appearing, and in no distress Chest - normal effort Abdomen - gravid, non-tender Fundal Height:  size equals dates Extremities: Homans sign is negative, no sign of DVT  Membranes:ruptured, clear fluid  Fetal Monitoring:  Baseline: 145 bpm, Variability: Good {> 6 bpm), Accelerations: Reactive and Decelerations: occasional variable  Labs:  Results for orders placed or performed during the hospital encounter of 06/28/19 (from the past 24 hour(s))  Comprehensive metabolic panel   Collection Time: 06/29/19  9:56 PM  Result Value Ref Range   Sodium 137 135 - 145 mmol/L   Potassium 4.0 3.5 - 5.1 mmol/L   Chloride 107 98 - 111 mmol/L   CO2 21 (L) 22 - 32 mmol/L   Glucose, Bld 115 (H) 70 - 99 mg/dL   BUN 5 (L) 6 - 20 mg/dL   Creatinine, Ser 0.64 0.44 - 1.00 mg/dL   Calcium 9.1 8.9 - 10.3 mg/dL   Total Protein 6.2 (L) 6.5 - 8.1 g/dL   Albumin 2.7 (L) 3.5 - 5.0 g/dL   AST 16 15 - 41 U/L   ALT 12 0 - 44 U/L   Alkaline Phosphatase 118 38 - 126 U/L   Total Bilirubin 0.2 (L) 0.3 - 1.2 mg/dL   GFR calc non Af Amer >60 >60 mL/min   GFR calc Af Amer >60 >60 mL/min   Anion gap 9 5 - 15     Medications:   Scheduled . amoxicillin  500 mg Oral Q8H  . aspirin EC  81 mg Oral Daily  . butalbital-acetaminophen-caffeine  1 tablet Oral Once  . docusate sodium  100 mg Oral Daily  . oxytocin 40 units in LR 1000 mL  500 mL Intravenous Once  . prenatal multivitamin  1 tablet Oral Q1200   I have reviewed the patient's current medications.  ASSESSMENT: Active Problems:   Preterm premature rupture of membranes (PPROM) with unknown onset of labor   Preterm premature rupture of membranes (PPROM) with onset of labor within 24 hours of rupture in first trimester, antepartum IUGR Previous C-section  PLAN: Transfer to Ante Continue latency Abx Growth scan on Wednesday Delivery with worsening maternal/fetal condition.  Donnamae Jude, MD 06/30/2019,7:44 AM

## 2019-06-30 NOTE — Progress Notes (Signed)
Patient and mother in the room when nurse arrived to give scheduled medication.  Patient is visibly anxious and agitated.  Nurse explores what concerns patient has.  Pt is verbally aggressive and appears to be angry about her care.  When questions further patient believes that her baby is "not safe" if she remains pregnant and that she believes the baby will be safer in the NICU.  Mothers fears are validated that this is not an ideal situation and that it is hard when there are so many unknowns.    Patient educated on standard of care for SROM before 34 weeks including antibiotics, monitoring, steroids and any complications that might arise.  Patient states that she doesn't care and that "she knows her baby" and that she wants to deliver today.  Mother reassured that she is the best one to make decisions for her baby and we too want to make sure she and her baby are safe and have the best care moving forward.    Patient continues to be agitated.  Mother of patient is understanding and attempts several times to get patient to be open minded to the current POC.  Pt continues to be angry and agitated.    Nurse further validates how pt is feeling and offers suggestions for thing that are in the patients control in this moment.  Nurse offers to put baby on the monitor, patient refuses.  Nurse offers patient a visit to the family porch. Patient seems accepting of that and appears excited to be able to leave the room.   MD is notified that patient would like to discuss POC moving forward.

## 2019-06-30 NOTE — Progress Notes (Signed)
Patient ID: Kaitlyn Hunt, female   DOB: May 08, 1991, 28 y.o.   MRN: [id number] CTSP by nursing to discuss POC. Pt desires to have her baby delivered today. Explained to pt that plan would be to deliver at 34 weeks or per maternal/fetal indications. Pt became very upset and said she was going to leave. Nurse presented AMA papers to pt but she has changed her mind. See nurses notes for additional information. Pt with H/O Bipolar disordered. Will start Zoloft to see if this will help as well as PRN Vistaril.

## 2019-07-01 ENCOUNTER — Inpatient Hospital Stay (HOSPITAL_COMMUNITY): Payer: Medicaid Other | Admitting: Anesthesiology

## 2019-07-01 ENCOUNTER — Encounter (HOSPITAL_COMMUNITY)
Admission: AD | Disposition: A | Discharge status: Home / Self Care | Payer: Self-pay | Source: Home / Self Care | Attending: Obstetrics & Gynecology

## 2019-07-01 ENCOUNTER — Encounter (HOSPITAL_COMMUNITY): Payer: Self-pay | Admitting: Obstetrics and Gynecology

## 2019-07-01 DIAGNOSIS — O365930 Maternal care for other known or suspected poor fetal growth, third trimester, not applicable or unspecified: Secondary | ICD-10-CM

## 2019-07-01 DIAGNOSIS — O42113 Preterm premature rupture of membranes, onset of labor more than 24 hours following rupture, third trimester: Secondary | ICD-10-CM

## 2019-07-01 DIAGNOSIS — O98919 Unspecified maternal infectious and parasitic disease complicating pregnancy, unspecified trimester: Secondary | ICD-10-CM | POA: Diagnosis not present

## 2019-07-01 DIAGNOSIS — O411230 Chorioamnionitis, third trimester, not applicable or unspecified: Secondary | ICD-10-CM

## 2019-07-01 DIAGNOSIS — Z3A32 32 weeks gestation of pregnancy: Secondary | ICD-10-CM

## 2019-07-01 DIAGNOSIS — O34211 Maternal care for low transverse scar from previous cesarean delivery: Secondary | ICD-10-CM

## 2019-07-01 LAB — TYPE AND SCREEN
ABO/RH(D): O POS
Antibody Screen: NEGATIVE

## 2019-07-01 LAB — CBC WITH DIFFERENTIAL/PLATELET
Abs Immature Granulocytes: 0.19 10*3/uL — ABNORMAL HIGH (ref 0.00–0.07)
Basophils Absolute: 0 10*3/uL (ref 0.0–0.1)
Basophils Relative: 0 %
Eosinophils Absolute: 0 10*3/uL (ref 0.0–0.5)
Eosinophils Relative: 0 %
HCT: 33.5 % — ABNORMAL LOW (ref 36.0–46.0)
Hemoglobin: 11.5 g/dL — ABNORMAL LOW (ref 12.0–15.0)
Immature Granulocytes: 1 %
Lymphocytes Relative: 21 %
Lymphs Abs: 3.3 10*3/uL (ref 0.7–4.0)
MCH: 31.1 pg (ref 26.0–34.0)
MCHC: 34.3 g/dL (ref 30.0–36.0)
MCV: 90.5 fL (ref 80.0–100.0)
Monocytes Absolute: 1.2 10*3/uL — ABNORMAL HIGH (ref 0.1–1.0)
Monocytes Relative: 8 %
Neutro Abs: 11.2 10*3/uL — ABNORMAL HIGH (ref 1.7–7.7)
Neutrophils Relative %: 70 %
Platelets: 248 10*3/uL (ref 150–400)
RBC: 3.7 MIL/uL — ABNORMAL LOW (ref 3.87–5.11)
RDW: 13.3 % (ref 11.5–15.5)
WBC: 16 10*3/uL — ABNORMAL HIGH (ref 4.0–10.5)
nRBC: 0 % (ref 0.0–0.2)

## 2019-07-01 SURGERY — Surgical Case
Anesthesia: Spinal | Site: Abdomen | Wound class: Clean Contaminated

## 2019-07-01 MED ORDER — MENTHOL 3 MG MT LOZG: 1.0000 | LOZENGE | OROMUCOSAL | Status: DC | PRN

## 2019-07-01 MED ORDER — MEDROXYPROGESTERONE ACETATE 150 MG/ML IM SUSP
150.0000 mg | Freq: Once | INTRAMUSCULAR | Status: AC
  Administered 2019-07-03: 08:00:00 150 mg via INTRAMUSCULAR
  Filled 2019-07-01: qty 1

## 2019-07-01 MED ORDER — ENOXAPARIN SODIUM 40 MG/0.4ML ~~LOC~~ SOLN
40.0000 mg | SUBCUTANEOUS | Status: DC
  Administered 2019-07-02: 40 mg via SUBCUTANEOUS
  Filled 2019-07-01: qty 0.4

## 2019-07-01 MED ORDER — OXYTOCIN 40 UNITS IN NORMAL SALINE INFUSION - SIMPLE MED: 2.5000 [IU]/h | INTRAVENOUS | Status: AC

## 2019-07-01 MED ORDER — CEFAZOLIN SODIUM-DEXTROSE 2-4 GM/100ML-% IV SOLN
INTRAVENOUS | Status: AC
  Filled 2019-07-01: qty 100

## 2019-07-01 MED ORDER — FENTANYL CITRATE (PF) 100 MCG/2ML IJ SOLN
25.0000 ug | INTRAMUSCULAR | Status: DC | PRN
  Administered 2019-07-01: 50 ug via INTRAVENOUS

## 2019-07-01 MED ORDER — OXYTOCIN 40 UNITS IN NORMAL SALINE INFUSION - SIMPLE MED
INTRAVENOUS | Status: AC
  Filled 2019-07-01: qty 1000

## 2019-07-01 MED ORDER — METOCLOPRAMIDE HCL 5 MG/ML IJ SOLN: 10.0000 mg | Freq: Once | INTRAMUSCULAR | Status: DC | PRN

## 2019-07-01 MED ORDER — SIMETHICONE 80 MG PO CHEW
80.0000 mg | CHEWABLE_TABLET | Freq: Three times a day (TID) | ORAL | Status: DC
  Administered 2019-07-02 – 2019-07-03 (×4): 80 mg via ORAL
  Filled 2019-07-01 (×4): qty 1

## 2019-07-01 MED ORDER — ACETAMINOPHEN 325 MG PO TABS: 650.0000 mg | ORAL_TABLET | Freq: Three times a day (TID) | ORAL | Status: DC

## 2019-07-01 MED ORDER — BUPIVACAINE IN DEXTROSE 0.75-8.25 % IT SOLN
INTRATHECAL | Status: DC | PRN
  Administered 2019-07-01: 10.5 mg via INTRATHECAL

## 2019-07-01 MED ORDER — DIBUCAINE (PERIANAL) 1 % EX OINT: 1.0000 "application " | TOPICAL_OINTMENT | CUTANEOUS | Status: DC | PRN

## 2019-07-01 MED ORDER — NALOXONE HCL 0.4 MG/ML IJ SOLN: 0.4000 mg | INTRAMUSCULAR | Status: DC | PRN

## 2019-07-01 MED ORDER — SODIUM CHLORIDE 0.9 % IV SOLN
INTRAVENOUS | Status: DC | PRN
  Administered 2019-07-01: 18:00:00 via INTRAVENOUS

## 2019-07-01 MED ORDER — FENTANYL CITRATE (PF) 100 MCG/2ML IJ SOLN
INTRAMUSCULAR | Status: AC
  Filled 2019-07-01: qty 2

## 2019-07-01 MED ORDER — ONDANSETRON HCL 4 MG/2ML IJ SOLN: 4.0000 mg | Freq: Three times a day (TID) | INTRAMUSCULAR | Status: DC | PRN

## 2019-07-01 MED ORDER — PHENYLEPHRINE HCL-NACL 20-0.9 MG/250ML-% IV SOLN
INTRAVENOUS | Status: DC | PRN
  Administered 2019-07-01: 60 ug/min via INTRAVENOUS

## 2019-07-01 MED ORDER — KETOROLAC TROMETHAMINE 30 MG/ML IJ SOLN
INTRAMUSCULAR | Status: AC
  Filled 2019-07-01: qty 1

## 2019-07-01 MED ORDER — NALOXONE HCL 4 MG/10ML IJ SOLN
1.0000 ug/kg/h | INTRAVENOUS | Status: DC | PRN
  Filled 2019-07-01: qty 5

## 2019-07-01 MED ORDER — SODIUM CHLORIDE 0.9% FLUSH: 3.0000 mL | INTRAVENOUS | Status: DC | PRN

## 2019-07-01 MED ORDER — SODIUM CHLORIDE 0.9 % IV SOLN
3.0000 g | Freq: Four times a day (QID) | INTRAVENOUS | Status: AC
  Administered 2019-07-01 – 2019-07-02 (×3): 3 g via INTRAVENOUS
  Filled 2019-07-01 (×3): qty 8

## 2019-07-01 MED ORDER — SODIUM CHLORIDE 0.9 % IV SOLN
INTRAVENOUS | Status: DC | PRN
  Administered 2019-07-01: 18:00:00 40 [IU] via INTRAVENOUS

## 2019-07-01 MED ORDER — LACTATED RINGERS IV SOLN
INTRAVENOUS | Status: DC
  Administered 2019-07-01: 16:00:00 via INTRAVENOUS

## 2019-07-01 MED ORDER — DIPHENHYDRAMINE HCL 25 MG PO CAPS: 25.0000 mg | ORAL_CAPSULE | Freq: Four times a day (QID) | ORAL | Status: DC | PRN

## 2019-07-01 MED ORDER — NALBUPHINE HCL 10 MG/ML IJ SOLN
5.0000 mg | Freq: Once | INTRAMUSCULAR | Status: AC | PRN
  Administered 2019-07-01: 5 mg via INTRAVENOUS

## 2019-07-01 MED ORDER — NALBUPHINE HCL 10 MG/ML IJ SOLN: 5.0000 mg | INTRAMUSCULAR | Status: DC | PRN

## 2019-07-01 MED ORDER — POLYETHYLENE GLYCOL 3350 17 G PO PACK
17.0000 g | PACK | Freq: Every day | ORAL | Status: DC
  Administered 2019-07-02: 17 g via ORAL
  Filled 2019-07-01: qty 1

## 2019-07-01 MED ORDER — SODIUM CHLORIDE 0.9 % IV SOLN
3.0000 g | Freq: Four times a day (QID) | INTRAVENOUS | Status: DC
  Administered 2019-07-01 (×2): 3 g via INTRAVENOUS
  Filled 2019-07-01 (×2): qty 8

## 2019-07-01 MED ORDER — SCOPOLAMINE 1 MG/3DAYS TD PT72: 1.0000 | MEDICATED_PATCH | Freq: Once | TRANSDERMAL | Status: DC

## 2019-07-01 MED ORDER — ACETAMINOPHEN 500 MG PO TABS
1000.0000 mg | ORAL_TABLET | Freq: Four times a day (QID) | ORAL | Status: AC
  Administered 2019-07-01 – 2019-07-02 (×3): 1000 mg via ORAL
  Filled 2019-07-01 (×3): qty 2

## 2019-07-01 MED ORDER — DIPHENHYDRAMINE HCL 50 MG/ML IJ SOLN
12.5000 mg | INTRAMUSCULAR | Status: DC | PRN
  Administered 2019-07-01: 12.5 mg via INTRAVENOUS
  Filled 2019-07-01: qty 1

## 2019-07-01 MED ORDER — DIPHENHYDRAMINE HCL 25 MG PO CAPS: 25.0000 mg | ORAL_CAPSULE | ORAL | Status: DC | PRN

## 2019-07-01 MED ORDER — MORPHINE SULFATE (PF) 0.5 MG/ML IJ SOLN
INTRAMUSCULAR | Status: AC
  Filled 2019-07-01: qty 10

## 2019-07-01 MED ORDER — SODIUM CHLORIDE 0.9% FLUSH: 3.0000 mL | Freq: Two times a day (BID) | INTRAVENOUS | Status: DC

## 2019-07-01 MED ORDER — NALBUPHINE HCL 10 MG/ML IJ SOLN: 5.0000 mg | Freq: Once | INTRAMUSCULAR | Status: AC | PRN

## 2019-07-01 MED ORDER — CEFAZOLIN SODIUM-DEXTROSE 2-3 GM-%(50ML) IV SOLR
INTRAVENOUS | Status: DC | PRN
  Administered 2019-07-01: 2 g via INTRAVENOUS

## 2019-07-01 MED ORDER — ONDANSETRON HCL 4 MG/2ML IJ SOLN
INTRAMUSCULAR | Status: AC
  Filled 2019-07-01: qty 2

## 2019-07-01 MED ORDER — WITCH HAZEL-GLYCERIN EX PADS: 1.0000 "application " | MEDICATED_PAD | CUTANEOUS | Status: DC | PRN

## 2019-07-01 MED ORDER — PHENYLEPHRINE HCL-NACL 20-0.9 MG/250ML-% IV SOLN
INTRAVENOUS | Status: AC
  Filled 2019-07-01: qty 250

## 2019-07-01 MED ORDER — MEPERIDINE HCL 25 MG/ML IJ SOLN: 6.2500 mg | INTRAMUSCULAR | Status: DC | PRN

## 2019-07-01 MED ORDER — BUPIVACAINE IN DEXTROSE 0.75-8.25 % IT SOLN: INTRATHECAL | Status: DC | PRN

## 2019-07-01 MED ORDER — KETOROLAC TROMETHAMINE 30 MG/ML IJ SOLN
30.0000 mg | Freq: Four times a day (QID) | INTRAMUSCULAR | Status: AC | PRN
  Administered 2019-07-01: 30 mg via INTRAMUSCULAR

## 2019-07-01 MED ORDER — COCONUT OIL OIL: 1.0000 "application " | TOPICAL_OIL | Status: DC | PRN

## 2019-07-01 MED ORDER — NALBUPHINE HCL 10 MG/ML IJ SOLN
5.0000 mg | INTRAMUSCULAR | Status: DC | PRN
  Filled 2019-07-01: qty 1

## 2019-07-01 MED ORDER — LACTATED RINGERS IV SOLN
INTRAVENOUS | Status: DC
  Administered 2019-07-02: 07:00:00 via INTRAVENOUS

## 2019-07-01 MED ORDER — OXYCODONE HCL 5 MG PO TABS
5.0000 mg | ORAL_TABLET | ORAL | Status: DC | PRN
  Administered 2019-07-02 (×2): 5 mg via ORAL
  Filled 2019-07-01 (×2): qty 1

## 2019-07-01 MED ORDER — TETANUS-DIPHTH-ACELL PERTUSSIS 5-2.5-18.5 LF-MCG/0.5 IM SUSP: 0.5000 mL | Freq: Once | INTRAMUSCULAR | Status: DC

## 2019-07-01 MED ORDER — SODIUM CHLORIDE 0.9 % IV SOLN: 250.0000 mL | INTRAVENOUS | Status: DC | PRN

## 2019-07-01 MED ORDER — SODIUM CHLORIDE 0.9 % IR SOLN
Status: DC | PRN
  Administered 2019-07-01: 1000 mL

## 2019-07-01 MED ORDER — PRENATAL MULTIVITAMIN CH
1.0000 | ORAL_TABLET | Freq: Every day | ORAL | Status: DC
  Filled 2019-07-01: qty 1

## 2019-07-01 MED ORDER — KETOROLAC TROMETHAMINE 30 MG/ML IJ SOLN: 30.0000 mg | Freq: Four times a day (QID) | INTRAMUSCULAR | Status: AC | PRN

## 2019-07-01 MED ORDER — LACTATED RINGERS IV SOLN: INTRAVENOUS | Status: DC

## 2019-07-01 MED ORDER — LACTATED RINGERS IV SOLN
INTRAVENOUS | Status: DC | PRN
  Administered 2019-07-01: 17:00:00 via INTRAVENOUS

## 2019-07-01 MED ORDER — SODIUM CHLORIDE 0.9 % IV SOLN
500.0000 mg | Freq: Once | INTRAVENOUS | Status: AC
  Administered 2019-07-01: 22:00:00 500 mg via INTRAVENOUS
  Filled 2019-07-01: qty 500

## 2019-07-01 MED ORDER — MORPHINE SULFATE (PF) 0.5 MG/ML IJ SOLN
INTRAMUSCULAR | Status: DC | PRN
  Administered 2019-07-01: .15 mg via INTRATHECAL

## 2019-07-01 MED ORDER — FENTANYL CITRATE (PF) 100 MCG/2ML IJ SOLN
INTRAMUSCULAR | Status: DC | PRN
  Administered 2019-07-01: 85 ug via INTRAVENOUS
  Administered 2019-07-01: 15 ug via INTRATHECAL

## 2019-07-01 MED ORDER — IBUPROFEN 800 MG PO TABS
800.0000 mg | ORAL_TABLET | Freq: Three times a day (TID) | ORAL | Status: DC
  Administered 2019-07-02 – 2019-07-03 (×4): 800 mg via ORAL
  Filled 2019-07-01 (×4): qty 1

## 2019-07-01 SURGICAL SUPPLY — 36 items
APL SKNCLS STERI-STRIP NONHPOA (GAUZE/BANDAGES/DRESSINGS) ×1
BENZOIN TINCTURE PRP APPL 2/3 (GAUZE/BANDAGES/DRESSINGS) ×3 IMPLANT
CANISTER SUCT 3000ML PPV (MISCELLANEOUS) ×3 IMPLANT
CHLORAPREP W/TINT 26ML (MISCELLANEOUS) ×3 IMPLANT
CLOSURE STERI STRIP 1/2 X4 (GAUZE/BANDAGES/DRESSINGS) ×1 IMPLANT
CLOSURE WOUND 1/2 X4 (GAUZE/BANDAGES/DRESSINGS) ×1
DRSG OPSITE POSTOP 4X10 (GAUZE/BANDAGES/DRESSINGS) ×3 IMPLANT
ELECT REM PT RETURN 9FT ADLT (ELECTROSURGICAL) ×3
ELECTRODE REM PT RTRN 9FT ADLT (ELECTROSURGICAL) ×1 IMPLANT
EXTRACTOR VACUUM KIWI (MISCELLANEOUS) ×3 IMPLANT
GLOVE BIOGEL PI IND STRL 7.0 (GLOVE) ×2 IMPLANT
GLOVE BIOGEL PI IND STRL 7.5 (GLOVE) ×1 IMPLANT
GLOVE BIOGEL PI INDICATOR 7.0 (GLOVE) ×4
GLOVE BIOGEL PI INDICATOR 7.5 (GLOVE) ×2
GLOVE SKINSENSE NS SZ7.0 (GLOVE) ×2
GLOVE SKINSENSE STRL SZ7.0 (GLOVE) ×1 IMPLANT
GOWN STRL REUS W/ TWL LRG LVL3 (GOWN DISPOSABLE) ×2 IMPLANT
GOWN STRL REUS W/ TWL XL LVL3 (GOWN DISPOSABLE) ×1 IMPLANT
GOWN STRL REUS W/TWL LRG LVL3 (GOWN DISPOSABLE) ×6
GOWN STRL REUS W/TWL XL LVL3 (GOWN DISPOSABLE) ×3
NS IRRIG 1000ML POUR BTL (IV SOLUTION) ×3 IMPLANT
PACK C SECTION WH (CUSTOM PROCEDURE TRAY) ×3 IMPLANT
PAD ABD 7.5X8 STRL (GAUZE/BANDAGES/DRESSINGS) ×3 IMPLANT
PAD OB MATERNITY 4.3X12.25 (PERSONAL CARE ITEMS) ×3 IMPLANT
PAD PREP 24X48 CUFFED NSTRL (MISCELLANEOUS) ×3 IMPLANT
PENCIL SMOKE EVAC W/HOLSTER (ELECTROSURGICAL) ×3 IMPLANT
STRIP CLOSURE SKIN 1/2X4 (GAUZE/BANDAGES/DRESSINGS) ×2 IMPLANT
SUT MNCRL 0 VIOLET CTX 36 (SUTURE) ×2 IMPLANT
SUT MON AB 4-0 PS1 27 (SUTURE) ×3 IMPLANT
SUT MONOCRYL 0 CTX 36 (SUTURE) ×4
SUT PLAIN 2 0 XLH (SUTURE) ×3 IMPLANT
SUT VIC AB 0 CT1 36 (SUTURE) ×6 IMPLANT
SUT VIC AB 3-0 CT1 27 (SUTURE) ×3
SUT VIC AB 3-0 CT1 TAPERPNT 27 (SUTURE) ×1 IMPLANT
TOWEL OR 17X24 6PK STRL BLUE (TOWEL DISPOSABLE) ×6 IMPLANT
WATER STERILE IRR 1000ML POUR (IV SOLUTION) ×3 IMPLANT

## 2019-07-01 NOTE — Progress Notes (Signed)
CSW complete chart review and acknowledged consult. CSW spoke with bedside nurse and was informed MOB is preparing for a scheduled c-section at [place] will meet with MOB after delivery.   Laurey Arrow, MSW, LCSW Clinical Social Work [phone]

## 2019-07-01 NOTE — Plan of Care (Signed)
Discussed with patient the importance her Betamethasone injections,staying hydrated,and the reason she is on antibiotics due to her membranes being ruptured.She voiced that she understood her plan of care.

## 2019-07-01 NOTE — Anesthesia Preprocedure Evaluation (Signed)
Anesthesia Evaluation  Patient identified by MRN, date of birth, ID band Patient awake    Reviewed: Allergy & Precautions, H&P , NPO status , Patient's Chart, lab work & pertinent test results  Airway Mallampati: I  TM Distance: >3 FB Neck ROM: full    Dental no notable dental hx. (+) Teeth Intact   Pulmonary neg pulmonary ROS, former smoker,    Pulmonary exam normal breath sounds clear to auscultation       Cardiovascular hypertension, Pt. on medications negative cardio ROS Normal cardiovascular exam Rhythm:regular Rate:Normal     Neuro/Psych    GI/Hepatic negative GI ROS, Neg liver ROS,   Endo/Other  negative endocrine ROS  Renal/GU negative Renal ROS     Musculoskeletal   Abdominal Normal abdominal exam  (+)   Peds  Hematology  (+) Blood dyscrasia, anemia ,   Anesthesia Other Findings   Reproductive/Obstetrics (+) Pregnancy                             Anesthesia Physical Anesthesia Plan  ASA: II  Anesthesia Plan: Spinal   Post-op Pain Management:    Induction:   PONV Risk Score and Plan: 3 and Ondansetron, Dexamethasone and Scopolamine patch - Pre-op  Airway Management Planned: Nasal Cannula and Natural Airway  Additional Equipment: None  Intra-op Plan:   Post-operative Plan:   Informed Consent: I have reviewed the patients History and Physical, chart, labs and discussed the procedure including the risks, benefits and alternatives for the proposed anesthesia with the patient or authorized representative who has indicated his/her understanding and acceptance.     Consent reviewed with POA  Plan Discussed with: CRNA  Anesthesia Plan Comments:         Anesthesia Quick Evaluation

## 2019-07-01 NOTE — Anesthesia Postprocedure Evaluation (Signed)
Anesthesia Post Note  Patient: Kaitlyn Hunt  Procedure(s) Performed: CESAREAN SECTION (N/A Abdomen)     Patient location during evaluation: PACU Anesthesia Type: Spinal Level of consciousness: awake and alert and oriented Pain management: pain level controlled Vital Signs Assessment: post-procedure vital signs reviewed and stable Respiratory status: spontaneous breathing, nonlabored ventilation and respiratory function stable Cardiovascular status: blood pressure returned to baseline Postop Assessment: no apparent nausea or vomiting, spinal receding and patient able to bend at knees Anesthetic complications: no                    Brennan Bailey

## 2019-07-01 NOTE — Progress Notes (Signed)
Patient ID: Kaitlyn Hunt, female   DOB: 08-23-1991, 28 y.o.   MRN: [id number] [place]) NOTE  Kaitlyn Hunt is a 28 y.o. G2P0101 at [redacted]w[redacted]d by best clinical estimate who is admitted for PROM.   Fetal presentation is cephalic. Length of Stay:  3  Days  Subjective: Still leaking clear fluid. She reports severe abdominal pain since yesterday evening. Patient reports the fetal movement as active. Patient reports uterine contraction  activity as none. Patient reports  vaginal bleeding as none. Patient describes fluid per vagina as clear.  Vitals:  Blood pressure 134/74, pulse (!) 57, temperature 98.6 F (37 C), temperature source Oral, resp. rate 16, height 5\' 3"  (1.6 m), weight 53.1 kg, last menstrual period 11/16/2018, SpO2 99 %. Physical Examination:  General appearance - alert, well appearing, and in no distress Chest - normal effort Abdomen - gravid, +moderate-severe fundal tenderness Fundal Height:  size equals dates Extremities: Homans sign is negative, no sign of DVT  Membranes:ruptured, clear fluid  Fetal Monitoring:  Baseline: 155 bpm, Variability: Good {> 6 bpm), Accelerations: Reactive and Decelerations: occasional variable  Labs:  Results for orders placed or performed during the hospital encounter of 06/28/19 (from the past 24 hour(s))  CBC with Differential/Platelet   Collection Time: 07/01/19 10:48 AM  Result Value Ref Range   WBC 16.0 (H) 4.0 - 10.5 K/uL   RBC 3.70 (L) 3.87 - 5.11 MIL/uL   Hemoglobin 11.5 (L) 12.0 - 15.0 g/dL   HCT 33.5 (L) 36.0 - 46.0 %   MCV 90.5 80.0 - 100.0 fL   MCH 31.1 26.0 - 34.0 pg   MCHC 34.3 30.0 - 36.0 g/dL   RDW 13.3 11.5 - 15.5 %   Platelets 248 150 - 400 K/uL   nRBC 0.0 0.0 - 0.2 %   Neutrophils Relative % 70 %   Neutro Abs 11.2 (H) 1.7 - 7.7 K/uL   Lymphocytes Relative 21 %   Lymphs Abs 3.3 0.7 - 4.0 K/uL   Monocytes Relative 8 %   Monocytes Absolute 1.2 (H) 0.1 - 1.0 K/uL   Eosinophils  Relative 0 %   Eosinophils Absolute 0.0 0.0 - 0.5 K/uL   Basophils Relative 0 %   Basophils Absolute 0.0 0.0 - 0.1 K/uL   Immature Granulocytes 1 %   Abs Immature Granulocytes 0.19 (H) 0.00 - 0.07 K/uL  Type and screen   Collection Time: 07/01/19 10:49 AM  Result Value Ref Range   ABO/RH(D) O POS    Antibody Screen NEG    Sample Expiration      07/04/2019,2359 Performed at [facility], [address] [place], [place] [address]      Medications:  Scheduled . aspirin EC  81 mg Oral Daily  . docusate sodium  100 mg Oral Daily  . oxytocin 40 units in LR 1000 mL  500 mL Intravenous Once  . prenatal multivitamin  1 tablet Oral Q1200  . sertraline  25 mg Oral Daily  . sodium chloride flush  3 mL Intravenous Q12H   I have reviewed the patient's current medications.  ASSESSMENT: Principal Problem:   Intrauterine infection Active Problems:   History of cesarean delivery, antepartum   Pregnancy affected by fetal growth restriction   Preterm premature rupture of membranes (PPROM) with unknown onset of labor IUGR Previous C-section  PLAN: Given significant uterine tenderness (no fever,maternal/fetal tachycardia, WBC stable at 16), concerned about intrauterine infection/inflammation. This is an indication for delivery. Currently, she has  a Category I FHR tracing.  The risks of cesarean section discussed with the patient included but were not limited to: bleeding which may require transfusion or reoperation; infection which may require antibiotics; injury to bowel, bladder, ureters or other surrounding organs; injury to the fetus; need for additional procedures including hysterectomy in the event of a life-threatening hemorrhage; placental abnormalities wth subsequent pregnancies, incisional problems, thromboembolic phenomenon and other postoperative/anesthesia complications. The patient concurred with the proposed plan, written consent for the procedure already in chart.   Patient  has been NPO since 09-- she will remain NPO for procedure. Dr. Ilda Basset (L&D Attending), Anesthesia and OR teams aware. NICU team also aware.  Preoperative prophylactic antibiotics and SCDs ordered on call to the OR.  To OR when ready.   Verita Schneiders, MD, [place] for [employer], [place]

## 2019-07-01 NOTE — Op Note (Addendum)
Operative Note   SURGERY DATE: 07/01/2019  PRE-OP DIAGNOSIS:  Pregnancy at [redacted]w[redacted]d PPROM Chorioamnionitis H/o previous Cesarean delivery Remote from delivery  POST-OP DIAGNOSIS:  Same Delivered  PROCEDURE: repeat low transverse cesarean section via pfannenstiel skin incision with double layer uterine closure  SURGEON: Surgeon(s) and Role: Aletha Halim, MD - Primary  ASSISTANT: Sparacino, Hailey, DO   ANESTHESIA: spinal  ESTIMATED BLOOD LOSS: 295 mL  DRAINS: 250 mL UOP via indwelling foley  TOTAL IV FLUIDS: 125 mL crystalloid  VTE PROPHYLAXIS: SCDs to bilateral lower extremities  ANTIBIOTICS: Patient received Unasyn prior to her c-section, while awaiting appropriate NPO status, and ancef within one hour of skin inicision and was given azithromycin 500mg  IV x 1 in the PACU   SPECIMENS: Placenta  COMPLICATIONS: None  FINDINGS: No intra-abdominal adhesions were noted. Grossly normal uterus, tubes and ovaries. Clear amniotic fluid (malodorous), cephalic female infant, weight 1175gm, APGARs 8 at one minute and 9 at five minutes, intact placenta.  PROCEDURE IN DETAIL: The patient was taken to the operating room where anesthesia was administered and normal fetal heart tones were confirmed. She was then prepped and draped in the normal fashion in the dorsal supine position with a leftward tilt.  After a time out was performed, a pfannensteil skin incision was made with the scalpel and carried through to the underlying layer of fascia. The fascia was then incised at the midline and this incision was extended laterally with the mayo scissors. Attention was turned to the superior aspect of the fascial incision which was grasped with the kocher clamps x 2, tented up and the rectus muscles were dissected off bluntly and then with the scalpel. In a similar fashion the inferior aspect of the fascial incision was grasped with the kocher clamps, tented up and the rectus muscles dissected off  with the mayo scissors. The rectus muscles were then separated in the midline and the peritoneum was entered bluntly. The bladder blade was inserted and the vesicouterine peritoneum was identified, tented up and entered with the metzenbaum scissors. This incision was extended laterally and the bladder flap was created digitally. The Alexis O C-section retractor was inserted.  A low transverse hysterotomy was made with the scalpel until the endometrial cavity was breached and the amniotic sac ruptured with the Allis clamp, yielding clear amniotic fluid. This incision was extended bluntly and the infant's head, shoulders and body were delivered atraumatically.The cord was clamped x 2 and cut, and the infant was handed to the awaiting pediatricians, after delayed cord clamping was done.  The placenta was then gradually expressed from the uterus and then the uterus was exteriorized and cleared of all clots and debris. The hysterotomy was repaired with a running suture of 1-0 monocryl. A second imbricating layer of 1-0 monocryl suture was then placed. Several figure-of-eight sutures of monocryl and Vicryl were added to achieve excellent hemostasis.   The uterus and adnexa were then returned to the abdomen, and the hysterotomy and all operative sites were reinspected and excellent hemostasis was noted after irrigation and suction of the abdomen with warm saline.  The peritoneum was closed with a running stitch of 3-0 Vicryl. The fascia was reapproximated with 0 Vicryl in a simple running fashion bilaterally. The subcutaneous layer was then reapproximated with interrupted sutures of 2-0 plain gut, and the skin was then closed with 4-0 monocryl, in a subcuticular fashion.  The patient  tolerated the procedure well. Sponge, lap, needle, and instrument counts were correct x 2. The  patient was transferred to the recovery room awake, alert and breathing independently in stable condition.  Hailey L Sparacino, DO OB  [facility] [employer])  Agree with above. I was present and scrubbed for the entire procedure.   Durene Romans MD Attending Center for [employer])

## 2019-07-01 NOTE — Consult Note (Addendum)
Neonatology Note:   Attendance at C-section:    I was asked by Dr. Ilda Basset to attend this C/S at 32.[redacted] wks EGA due to abdominal pain. The mother is a G2P0101, GBS +  with good prenatal care complicated by PROM, PTL, severe IUGR (normal doppler 8/5), bipolar, depression, and hyperthyroidism.  She received Unasyn x2 and Ancef x1 just PTD. Recent treated trich infection; TOC pending.  ROM 06/28/19, fluid clear. Infant vigorous with good spontaneous cry and tone. +60 sec DCC. Needed only minimal bulb suctioning. Ap 8/9. Lungs clear to ausc in DR. Small for GA.  Mother and support person updated and baby introduced.  To NICU on RA.    Monia Sabal Katherina Mires, MD

## 2019-07-01 NOTE — Anesthesia Procedure Notes (Signed)
Spinal  Patient location during procedure: OR Start time: 07/01/2019 5:06 PM End time: 07/01/2019 5:09 PM Staffing Anesthesiologist: Lyn Hollingshead, MD Performed: anesthesiologist  Preanesthetic Checklist Completed: patient identified, site marked, surgical consent, pre-op evaluation, timeout performed, IV checked, risks and benefits discussed and monitors and equipment checked Spinal Block Patient position: sitting Prep: site prepped and draped and DuraPrep Patient monitoring: continuous pulse ox and blood pressure Approach: midline Location: L3-4 Injection technique: single-shot Needle Needle type: Pencan  Needle gauge: 24 G Needle length: 10 cm Needle insertion depth: 5 cm Assessment Sensory level: T4

## 2019-07-01 NOTE — Transfer of Care (Signed)
Immediate Anesthesia Transfer of Care Note  Patient: Kaitlyn Hunt  Procedure(s) Performed: CESAREAN SECTION (N/A Abdomen)  Patient Location: PACU  Anesthesia Type:Spinal  Level of Consciousness: awake  Airway & Oxygen Therapy: Patient Spontanous Breathing  Post-op Assessment: Report given to RN and Post -op Vital signs reviewed and stable  Post vital signs: stable  Last Vitals:  Vitals Value Taken Time  BP 111/73 07/01/19 1843  Temp    Pulse 66 07/01/19 1845  Resp 17 07/01/19 1845  SpO2 98 % 07/01/19 1845  Vitals shown include unvalidated device data.  Last Pain:  Vitals:   07/01/19 1650  TempSrc: Oral  PainSc:       Patients Stated Pain Goal: 3 (38/46/65 9935)  Complications: No apparent anesthesia complications

## 2019-07-02 ENCOUNTER — Encounter (HOSPITAL_COMMUNITY): Payer: Self-pay | Admitting: Obstetrics and Gynecology

## 2019-07-02 LAB — CBC
HCT: 24.6 % — ABNORMAL LOW (ref 36.0–46.0)
Hemoglobin: 8.4 g/dL — ABNORMAL LOW (ref 12.0–15.0)
MCH: 31 pg (ref 26.0–34.0)
MCHC: 34.1 g/dL (ref 30.0–36.0)
MCV: 90.8 fL (ref 80.0–100.0)
Platelets: 188 10*3/uL (ref 150–400)
RBC: 2.71 MIL/uL — ABNORMAL LOW (ref 3.87–5.11)
RDW: 13.2 % (ref 11.5–15.5)
WBC: 16.3 10*3/uL — ABNORMAL HIGH (ref 4.0–10.5)
nRBC: 0 % (ref 0.0–0.2)

## 2019-07-02 LAB — CREATININE, SERUM
Creatinine, Ser: 0.76 mg/dL (ref 0.44–1.00)
GFR calc Af Amer: >60 mL/min (ref >60)
GFR calc non Af Amer: >60 mL/min (ref >60)

## 2019-07-02 LAB — GC/CHLAMYDIA PROBE AMP (~~LOC~~) NOT AT ARMC
Chlamydia: POSITIVE — AB
Neisseria Gonorrhea: NEGATIVE

## 2019-07-02 MED ORDER — ACETAMINOPHEN 325 MG PO TABS
650.0000 mg | ORAL_TABLET | Freq: Three times a day (TID) | ORAL | Status: DC
  Administered 2019-07-02 – 2019-07-03 (×3): 650 mg via ORAL
  Filled 2019-07-02 (×3): qty 2

## 2019-07-02 NOTE — Plan of Care (Signed)
  Problem: Education: Goal: Knowledge of condition will improve Outcome: Progressing

## 2019-07-02 NOTE — Progress Notes (Signed)
Subjective: Postpartum Day 1: Cesarean Delivery Patient reports incisional pain and tolerating PO.    Objective: Vital signs in last 24 hours: Temp:  [97.6 F (36.4 C)-98.7 F (37.1 C)] 97.8 F (36.6 C) (08/11 0827) Pulse Rate:  [54-96] 55 (08/11 0827) Resp:  [10-18] 17 (08/11 0827) BP: (100-134)/(50-79) 118/72 (08/11 0827) SpO2:  [95 %-100 %] 99 % (08/11 0827)  Physical Exam:  General: alert, cooperative and no distress Lochia: appropriate Uterine Fundus: firm Incision: healing well, no significant drainage, no dehiscence, no significant erythema DVT Evaluation: No evidence of DVT seen on physical exam. Negative Homan's sign. No cords or calf tenderness. No significant calf/ankle edema.  Recent Labs    07/01/19 1048 07/02/19 0552  HGB 11.5* 8.4*  HCT 33.5* 24.6*    Assessment/Plan: Status post Cesarean section. Doing well postoperatively.  Continue current care. Remove foley, ambulate  Truett Mainland 07/02/2019, 9:28 AM

## 2019-07-02 NOTE — Clinical Social Work Maternal (Signed)
CLINICAL SOCIAL WORK MATERNAL/CHILD NOTE  Patient Details  Name: Kaitlyn Hunt MRN: [id number] Date of Birth: 01/12/1991  Date:  07/02/2019  Clinical Social Worker Initiating Note:  Kaitlyn Hunt Date/Time: Initiated:  07/02/19/1100     Child's Name:  Kaitlyn Hunt   Biological Parents:  Mother(MOB refused to provide any information about FOB.)   Need for Interpreter:  None   Reason for Referral:  Behavioral Health Concerns, Parental Support of Premature Babies < 32 weeks/or Critically Ill babies   Address:  [address] [place] [address]    Phone number:  [phone] (home)     Additional phone number:   Household Members/Support Persons (HM/SP):   Household Member/Support Person 1(MOB reported that MOB resides with her sister.)   HM/SP Name Relationship DOB or Age  HM/SP -1 Kaitlyn Hunt son 02/15/2011  HM/SP -2        HM/SP -3        HM/SP -4        HM/SP -5        HM/SP -6        HM/SP -7        HM/SP -8          Natural Supports (not living in the home):  Parent, Extended Family, Immediate Family   Professional Supports: None(referrals were provided to MOB for outpatient counseling.)   Employment: Full-time   Type of Work: [employer] on [address]   Education:  9 to 11 years(MOB reported that she completed 10th grade.)   Homebound arranged: No  Financial Resources:      Other Resources:  WIC(MOB plans to apply for Food Stamps.)   Cultural/Religious Considerations Which May Impact Care:  None reported Strengths:  Ability to meet basic needs , Pediatrician chosen, Home prepared for child , Understanding of illness   Psychotropic Medications:         Pediatrician:    [place] area  Pediatrician List:   [facility]  [place]    [place]    [place]    [place]    [place]      Pediatrician Fax Number:    Risk Factors/Current Problems:       Cognitive State:  Alert , Able to Concentrate , Linear Thinking    Mood/Affect:  Interested , Happy , Bright , Relaxed    CSW Assessment: CSW met with MOB in room 114 to complete an assessment for NICU admission and MH hx.  When CSW arrived, MOB was resting in bed and infant was in the NICU.  CSW explained CSW's role and invited MOB to ask question. MOB was polite, forthcoming, and receptive to meeting with CSW.   CSW asked about MOB's thoughts and feeling regarding infant's NICU admission. MOB shared feeling good knowing that her baby is good.  MOB also stated, "I'm familiar with the NICU because my son was born at 32 weeks and had to stay in the NICU for 2 weeks." Per MOB, MOB was very emotional during her son's NICU stay however MOB do not feel as scared and concerned "this time around." CSW validated and normalized MOB's thoughts and feelings and reviewed NICU visitation policy.  MOB shared that she feels like she has a good understanding of infant's health and expressed gratitude that medical team is keeping her update.   CSW asked about MOB's MH hx an MOB acknowledged being dx with bipolar disorder around 16 years of   ago.  Per MOB, symptoms have been managed with Seroquel in the past.  MOB shared that MOB's medication is no longer covered by Medicaid so Medicaid discontinue to the use. CSW offered MOB resources for outpatient counseling and medication and MOB was accepting; CSW provided MOB with information to [facility].   CSW provided education regarding the baby blues period vs. perinatal mood disorders, discussed treatment and gave resources for mental health follow up if concerns arise.  CSW recommends self-evaluation during the postpartum time period using the New Mom Checklist from Postpartum Progress and encouraged MOB to contact a medical professional if symptoms are noted at any time.  MOB did not present with any MH symptoms and appeared to have insight and  awareness. CSW assessed for safety and MOB denied SI, HI, and DV. MOB reported that she has the support of her sisters and mother if a need arises.   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 Per MOB, "I smoke occasionally to help me sleep and relax. MOB communicated her last use of marijuana was 8/7. CSW explained hospital substance exposed policy and MOB was understanding. MOB is aware that CSW will monitor infant's UDS and CDS and will make a report to [place] CPS if needed; MOB denied CPS hx. Medical team was updated regarding MOB's substance use.   CSW made MOB aware of infant's qualification for SSI benefits.  MOB shared being familiar with the SSI process an expressed an interest in applying.  All required documents were signed and CSW provided MOB with SSI's contact information.   CSW provided review of Sudden Infant Death Syndrome (SIDS) precautions.    CSW will continue to provide resources and supports to MOB while infant remains in NICU.    CSW Plan/Description:  Psychosocial Support and Ongoing Assessment of Needs, Sudden Infant Death Syndrome (SIDS) Education, Perinatal Mood and Anxiety Disorder (PMADs) Education, Other Patient/Family Education, Supplemental Security Income (SSI) Information, Hospital Drug Screen Policy Information, Other Information/Referral to Community Resources, CSW Will Continue to Monitor Umbilical Cord Tissue Drug Screen Results and Make Report if Warranted   Kaitlyn Hunt, MSW, LCSW Clinical Social Work [phone]   Kaitlyn Cappiello D BOYD-GILYARD, LCSW 07/02/2019, 1:36 PM  

## 2019-07-03 ENCOUNTER — Ambulatory Visit (HOSPITAL_COMMUNITY): Payer: Medicaid Other

## 2019-07-03 MED ORDER — OXYCODONE-ACETAMINOPHEN 5-325 MG PO TABS: 1.0000 | ORAL_TABLET | Freq: Four times a day (QID) | ORAL | 0 refills | Status: DC | PRN

## 2019-07-03 MED ORDER — IBUPROFEN 800 MG PO TABS: 800.0000 mg | ORAL_TABLET | Freq: Three times a day (TID) | ORAL | 0 refills | Status: DC

## 2019-07-03 NOTE — Discharge Summary (Signed)
Postpartum Discharge Summary     Patient Name: Kaitlyn Hunt DOB: 1991/01/25 MRN: [id number]  Date of admission: 06/28/2019 Delivering Provider: Aletha Halim   Date of discharge: 07/03/2019  Admitting diagnosis: EMS 32wk, water broke, ctx Intrauterine pregnancy: [redacted]w[redacted]d     Secondary diagnosis:  Principal Problem:   Intrauterine infection Active Problems:   History of cesarean delivery, antepartum   Pregnancy affected by fetal growth restriction   Preterm premature rupture of membranes (PPROM) with unknown onset of labor  Additional problems: none     Discharge diagnosis: Preterm Pregnancy Delivered                                                                                                Post partum procedures:none  Augmentation: none  Complications: None  Hospital course:  Onset of Labor With Unplanned C/S  28 y.o. yo G2P0202 at [redacted]w[redacted]d was admitted for PPROM on 06/28/2019. She was started on latency antibiotics. She developed increasing abdominal pain on HD#3 and was taken for cesarean section due to concerns of intrauterine inflammation/infection.  Membrane Rupture Time/Date: 4:00 PM ,06/28/2019   The patient went for cesarean section due to Elective Repeat, and delivered a Viable infant,07/01/2019  Details of operation can be found in separate operative note. Patient had an uncomplicated postpartum course.  She is ambulating,tolerating a regular diet, passing flatus, and urinating well.  Patient is discharged home in stable condition 07/03/19.  Magnesium Sulfate recieved: No BMZ received: Yes  Physical exam  Vitals:   07/02/19 1247 07/02/19 1631 07/02/19 1925 07/03/19 0543  BP: 131/81 (!) 112/53 131/65 110/68  Pulse: (!) 59 61 (!) 53 (!) 56  Resp: 17 18 17 17   Temp: 98.2 F (36.8 C) 98.2 F (36.8 C) 98.2 F (36.8 C) 98.3 F (36.8 C)  TempSrc: Oral Oral Oral Oral  SpO2: 99% 98% 98% 99%  Weight:      Height:       General: alert, cooperative and no  distress Lochia: appropriate Uterine Fundus: firm Incision: Healing well with no significant drainage, No significant erythema, Dressing is clean, dry, and intact DVT Evaluation: No evidence of DVT seen on physical exam. Negative Homan's sign. No cords or calf tenderness. No significant calf/ankle edema. Labs: Lab Results  Component Value Date   WBC 16.3 (H) 07/02/2019   HGB 8.4 (L) 07/02/2019   HCT 24.6 (L) 07/02/2019   MCV 90.8 07/02/2019   PLT 188 07/02/2019   CMP Latest Ref Rng & Units 07/02/2019  Glucose 70 - 99 mg/dL -  BUN 6 - 20 mg/dL -  Creatinine 0.44 - 1.00 mg/dL 0.76  Sodium 135 - 145 mmol/L -  Potassium 3.5 - 5.1 mmol/L -  Chloride 98 - 111 mmol/L -  CO2 22 - 32 mmol/L -  Calcium 8.9 - 10.3 mg/dL -  Total Protein 6.5 - 8.1 g/dL -  Total Bilirubin 0.3 - 1.2 mg/dL -  Alkaline Phos 38 - 126 U/L -  AST 15 - 41 U/L -  ALT 0 - 44 U/L -    Discharge instruction: per After Visit Summary  and "Baby and Me Booklet".  After visit meds:  Allergies as of 07/03/2019   No Known Allergies     Medication List    STOP taking these medications   aspirin EC 81 MG tablet   NIFEdipine 10 MG capsule Commonly known as: Procardia   terconazole 0.8 % vaginal cream Commonly known as: Terazol 3     TAKE these medications   ibuprofen 800 MG tablet Commonly known as: ADVIL Take 1 tablet (800 mg total) by mouth every 8 (eight) hours.   oxyCODONE-acetaminophen 5-325 MG tablet Commonly known as: PERCOCET/ROXICET Take 1 tablet by mouth every 6 (six) hours as needed.   Prenatal Adult Gummy/DHA/FA 0.4-25 MG Chew Chew 0.4 mg by mouth daily.       Diet: routine diet  Activity: Advance as tolerated. Pelvic rest for 6 weeks.   Outpatient follow up:2 weeks Follow up Appt: Future Appointments  Date Time Provider [place]  07/04/2019 11:15 AM Sloan Leiter, MD [facility]   Follow up Visit: [place].  Schedule an appointment as soon as possible for a visit in 2 week(s).   Specialty: Obstetrics and Gynecology Contact information: [address] [place], [place] [id number] [place] [id number] [phone]           Please schedule this patient for Postpartum visit in: 2 weeks with the following provider: RN For C/S patients schedule nurse incision check in weeks 2 weeks: yes High risk pregnancy complicated by: IUGR Delivery mode:  CS Anticipated Birth Control:  Depo PP Procedures needed: PAP  Schedule Integrated BH visit: yes      Newborn Data: Live born female  Birth Weight: 2 lb 9.5 oz (1175 g) APGAR: 15, 9  Newborn Delivery   Birth date/time: 07/01/2019 17:41:00 Delivery type: C-Section, Low Transverse Trial of labor: No C-section categorization: Repeat      Baby Feeding: Bottle Disposition:NICU   07/03/2019 Truett Mainland, DO

## 2019-07-03 NOTE — Discharge Instructions (Signed)
Cesarean Delivery, Care After This sheet gives you information about how to care for yourself after your procedure. Your health care provider may also give you more specific instructions. If you have problems or questions, contact your health care provider. What can I expect after the procedure? After the procedure, it is common to have:  A small amount of blood or clear fluid coming from the incision.  Some redness, swelling, and pain in your incision area.  Some abdominal pain and soreness.  Vaginal bleeding (lochia). Even though you did not have a vaginal delivery, you will still have vaginal bleeding and discharge.  Pelvic cramps.  Fatigue. You may have pain, swelling, and discomfort in the tissue between your vagina and your anus (perineum) if:  Your C-section was unplanned, and you were allowed to labor and push.  An incision was made in the area (episiotomy) or the tissue tore during attempted vaginal delivery. Follow these instructions at home: Incision care   Follow instructions from your health care provider about how to take care of your incision. Make sure you: ? Wash your hands with soap and water before you change your bandage (dressing). If soap and water are not available, use hand sanitizer. ? If you have a dressing, change it or remove it as told by your health care provider. ? Leave stitches (sutures), skin staples, skin glue, or adhesive strips in place. These skin closures may need to stay in place for 2 weeks or longer. If adhesive strip edges start to loosen and curl up, you may trim the loose edges. Do not remove adhesive strips completely unless your health care provider tells you to do that.  Check your incision area every day for signs of infection. Check for: ? More redness, swelling, or pain. ? More fluid or blood. ? Warmth. ? Pus or a bad smell.  Do not take baths, swim, or use a hot tub until your health care provider says it's okay. Ask your health  care provider if you can take showers.  When you cough or sneeze, hug a pillow. This helps with pain and decreases the chance of your incision opening up (dehiscing). Do this until your incision heals. Medicines  Take over-the-counter and prescription medicines only as told by your health care provider.  If you were prescribed an antibiotic medicine, take it as told by your health care provider. Do not stop taking the antibiotic even if you start to feel better.  Do not drive or use heavy machinery while taking prescription pain medicine. Lifestyle  Do not drink alcohol. This is especially important if you are breastfeeding or taking pain medicine.  Do not use any products that contain nicotine or tobacco, such as cigarettes, e-cigarettes, and chewing tobacco. If you need help quitting, ask your health care provider. Eating and drinking  Drink at least 8 eight-ounce glasses of water every day unless told not to by your health care provider. If you breastfeed, you may need to drink even more water.  Eat high-fiber foods every day. These foods may help prevent or relieve constipation. High-fiber foods include: ? Whole grain cereals and breads. ? Brown rice. ? Beans. ? Fresh fruits and vegetables. Activity   If possible, have someone help you care for your baby and help with household activities for at least a few days after you leave the hospital.  Return to your normal activities as told by your health care provider. Ask your health care provider what activities are safe for  you.  Rest as much as possible. Try to rest or take a nap while your baby is sleeping.  Do not lift anything that is heavier than 10 lbs (4.5 kg), or the limit that you were told, until your health care provider says that it is safe.  Talk with your health care provider about when you can engage in sexual activity. This may depend on your: ? Risk of infection. ? How fast you heal. ? Comfort and desire to  engage in sexual activity. General instructions  Do not use tampons or douches until your health care provider approves.  Wear loose, comfortable clothing and a supportive and well-fitting bra.  Keep your perineum clean and dry. Wipe from front to back when you use the toilet.  If you pass a blood clot, save it and call your health care provider to discuss. Do not flush blood clots down the toilet before you get instructions from your health care provider.  Keep all follow-up visits for you and your baby as told by your health care provider. This is important. Contact a health care provider if:  You have: ? A fever. ? Bad-smelling vaginal discharge. ? Pus or a bad smell coming from your incision. ? Difficulty or pain when urinating. ? A sudden increase or decrease in the frequency of your bowel movements. ? More redness, swelling, or pain around your incision. ? More fluid or blood coming from your incision. ? A rash. ? Nausea. ? Little or no interest in activities you used to enjoy. ? Questions about caring for yourself or your baby.  Your incision feels warm to the touch.  Your breasts turn red or become painful or hard.  You feel unusually sad or worried.  You vomit.  You pass a blood clot from your vagina.  You urinate more than usual.  You are dizzy or light-headed. Get help right away if:  You have: ? Pain that does not go away or get better with medicine. ? Chest pain. ? Difficulty breathing. ? Blurred vision or spots in your vision. ? Thoughts about hurting yourself or your baby. ? New pain in your abdomen or in one of your legs. ? A severe headache.  You faint.  You bleed from your vagina so much that you fill more than one sanitary pad in one hour. Bleeding should not be heavier than your heaviest period. Summary  After the procedure, it is common to have pain at your incision site, abdominal cramping, and slight bleeding from your vagina.  Check  your incision area every day for signs of infection.  Tell your health care provider about any unusual symptoms.  Keep all follow-up visits for you and your baby as told by your health care provider. This information is not intended to replace advice given to you by your health care provider. Make sure you discuss any questions you have with your health care provider. Document Released: 07/30/2002 Document Revised: 05/16/2018 Document Reviewed: 05/16/2018 Elsevier Patient Education  2020 [employer].

## 2019-07-03 NOTE — Progress Notes (Signed)
Discharge teaching complete with pt. Pt understood all instructions. Pt discharged home with family.

## 2019-07-04 ENCOUNTER — Telehealth: Payer: Medicaid Other | Admitting: Obstetrics and Gynecology

## 2019-07-04 ENCOUNTER — Encounter: Payer: Self-pay | Admitting: Obstetrics & Gynecology

## 2019-07-04 DIAGNOSIS — O411290 Chorioamnionitis, unspecified trimester, not applicable or unspecified: Secondary | ICD-10-CM

## 2019-07-04 HISTORY — DX: Chorioamnionitis, unspecified trimester, not applicable or unspecified: O41.1290

## 2019-07-09 ENCOUNTER — Ambulatory Visit: Payer: Self-pay

## 2019-07-09 NOTE — Lactation Note (Signed)
This note was copied from a baby's chart. Lactation Consultation Note Baby born at 47 3/7 weeks. Now 36 days old. Mom stated her milk started coming in 3 days ago. Mom's breast were engorged. Mom has cabbage on them wearing supportive bra. Mom planned on formula feeding baby. Breast hurting so bad mom stated it would relieve her from pain and baby could get her BM. Mom has a 30 yr old that she didn't BF. LC asked mom's commitment and what would she like to do. Mentioned to mom pumping every 3 hrs in order to keep from getting breast engorged. Mom doesn't have a pump at home. Mom has Monon. Mom stated she would like to try pumping. Asked mom if she wanted LC to send in referral, mom stated yes. [facility] referral [facility] to [facility]. Gave mom NICU BF booklet and Lactation brochure. Mom shown how to use DEBP & how to disassemble, clean, & reassemble parts.  Mom pumped 90 ml. ICE given to mom encouraged to lay back. Discussed w/mom she may need to pump in 1-2 hrs when breast are feeling up, not to wait for the knots and hardness to return until she gets the engorgement under control. Gave mom information sheet on Engorgement.  Mom knows to pump q3h for 15-20 min. For management.  Milk storage discussed.  Patient Name: Kaitlyn Hunt YBWLS'L Date: 07/09/2019 Reason for consult: NICU baby;Preterm <34wks;Engorgement   Maternal Data Has patient been taught Hand Expression?: Yes Does the patient have breastfeeding experience prior to this delivery?: No  Feeding Feeding Type: Donor Breast Milk  LATCH Score       Type of Nipple: Everted at rest and after stimulation  Comfort (Breast/Nipple): Engorged, cracked, bleeding, large blisters, severe discomfort        Interventions Interventions: DEBP;Ice;Breast massage;Reverse pressure;Breast compression;Expressed milk  Lactation Tools Discussed/Used Tools: Pump Breast pump type: Double-Electric Breast Pump WIC Program: Yes Pump  Review: Setup, frequency, and cleaning;Milk Storage Initiated by:: Allayne Stack RN IBCLC Date initiated:: 07/09/19   Consult Status Consult Status: PRN Date: 07/10/19(PRN as needed.) Follow-up type: Call as needed    Wafaa Deemer G 07/09/2019, 2:27 AM

## 2019-07-10 ENCOUNTER — Ambulatory Visit (HOSPITAL_COMMUNITY): Payer: Medicaid Other

## 2019-07-10 ENCOUNTER — Encounter (HOSPITAL_COMMUNITY): Payer: Self-pay

## 2019-07-13 ENCOUNTER — Emergency Department (HOSPITAL_COMMUNITY)
Admission: EM | Admit: 2019-07-13 | Discharge: 2019-07-13 | Disposition: A | Discharge status: Home / Self Care | Payer: Medicaid Other | Attending: Emergency Medicine | Admitting: Emergency Medicine

## 2019-07-13 ENCOUNTER — Other Ambulatory Visit: Payer: Self-pay

## 2019-07-13 ENCOUNTER — Encounter (HOSPITAL_COMMUNITY): Payer: Self-pay

## 2019-07-13 DIAGNOSIS — H547 Unspecified visual loss: Secondary | ICD-10-CM | POA: Diagnosis not present

## 2019-07-13 DIAGNOSIS — Z87891 Personal history of nicotine dependence: Secondary | ICD-10-CM | POA: Diagnosis not present

## 2019-07-13 DIAGNOSIS — R55 Syncope and collapse: Secondary | ICD-10-CM | POA: Insufficient documentation

## 2019-07-13 DIAGNOSIS — R1032 Left lower quadrant pain: Secondary | ICD-10-CM | POA: Insufficient documentation

## 2019-07-13 DIAGNOSIS — E876 Hypokalemia: Secondary | ICD-10-CM | POA: Insufficient documentation

## 2019-07-13 DIAGNOSIS — Z79899 Other long term (current) drug therapy: Secondary | ICD-10-CM | POA: Insufficient documentation

## 2019-07-13 DIAGNOSIS — N939 Abnormal uterine and vaginal bleeding, unspecified: Secondary | ICD-10-CM | POA: Insufficient documentation

## 2019-07-13 DIAGNOSIS — R42 Dizziness and giddiness: Secondary | ICD-10-CM | POA: Insufficient documentation

## 2019-07-13 DIAGNOSIS — H9190 Unspecified hearing loss, unspecified ear: Secondary | ICD-10-CM | POA: Diagnosis not present

## 2019-07-13 LAB — URINALYSIS, ROUTINE W REFLEX MICROSCOPIC
Bilirubin Urine: NEGATIVE
Glucose, UA: NEGATIVE mg/dL
Ketones, ur: NEGATIVE mg/dL
Nitrite: NEGATIVE
Protein, ur: NEGATIVE mg/dL
Specific Gravity, Urine: 1.008 (ref 1.005–1.030)
pH: 6 (ref 5.0–8.0)

## 2019-07-13 LAB — BASIC METABOLIC PANEL
Anion gap: 10 (ref 5–15)
BUN: 8 mg/dL (ref 6–20)
CO2: 19 mmol/L — ABNORMAL LOW (ref 22–32)
Calcium: 8.9 mg/dL (ref 8.9–10.3)
Chloride: 108 mmol/L (ref 98–111)
Creatinine, Ser: 0.68 mg/dL (ref 0.44–1.00)
GFR calc Af Amer: >60 mL/min (ref >60)
GFR calc non Af Amer: >60 mL/min (ref >60)
Glucose, Bld: 124 mg/dL — ABNORMAL HIGH (ref 70–99)
Potassium: 3.1 mmol/L — ABNORMAL LOW (ref 3.5–5.1)
Sodium: 137 mmol/L (ref 135–145)

## 2019-07-13 LAB — CBC
HCT: 31.1 % — ABNORMAL LOW (ref 36.0–46.0)
Hemoglobin: 10.1 g/dL — ABNORMAL LOW (ref 12.0–15.0)
MCH: 30.3 pg (ref 26.0–34.0)
MCHC: 32.5 g/dL (ref 30.0–36.0)
MCV: 93.4 fL (ref 80.0–100.0)
Platelets: 438 10*3/uL — ABNORMAL HIGH (ref 150–400)
RBC: 3.33 MIL/uL — ABNORMAL LOW (ref 3.87–5.11)
RDW: 13.5 % (ref 11.5–15.5)
WBC: 9.9 10*3/uL (ref 4.0–10.5)
nRBC: 0 % (ref 0.0–0.2)

## 2019-07-13 LAB — RAPID URINE DRUG SCREEN, HOSP PERFORMED
Amphetamines: NOT DETECTED
Barbiturates: NOT DETECTED
Benzodiazepines: NOT DETECTED
Cocaine: NOT DETECTED
Opiates: NOT DETECTED
Tetrahydrocannabinol: POSITIVE — AB

## 2019-07-13 LAB — SAMPLE TO BLOOD BANK

## 2019-07-13 LAB — I-STAT BETA HCG BLOOD, ED (MC, WL, AP ONLY): I-stat hCG, quantitative: 19.3 m[IU]/mL — ABNORMAL HIGH (ref <5)

## 2019-07-13 MED ORDER — SODIUM CHLORIDE 0.9 % IV BOLUS
1000.0000 mL | Freq: Once | INTRAVENOUS | Status: AC
  Administered 2019-07-13: 1000 mL via INTRAVENOUS

## 2019-07-13 MED ORDER — POTASSIUM CHLORIDE CRYS ER 20 MEQ PO TBCR
40.0000 meq | EXTENDED_RELEASE_TABLET | Freq: Once | ORAL | Status: AC
  Administered 2019-07-13: 19:00:00 40 meq via ORAL
  Filled 2019-07-13: qty 2

## 2019-07-13 MED ORDER — SODIUM CHLORIDE 0.9% FLUSH
3.0000 mL | Freq: Once | INTRAVENOUS | Status: AC
  Administered 2019-07-13: 3 mL via INTRAVENOUS

## 2019-07-13 MED ORDER — POTASSIUM CHLORIDE ER 10 MEQ PO TBCR: 10.0000 meq | EXTENDED_RELEASE_TABLET | Freq: Every day | ORAL | 0 refills | Status: DC

## 2019-07-13 NOTE — ED Notes (Signed)
Pt informed this RN that she did not want to go to [facility] tonight due to childcare complications. EDP made aware.

## 2019-07-13 NOTE — ED Triage Notes (Signed)
Pt from home with ems for c.o dizziness since this morning, pt 2 weeks postpartum. Denies any other symptoms, no abnormal vaginal bleeding. Pt a.o

## 2019-07-13 NOTE — ED Notes (Signed)
Patient Alert and oriented to baseline. Stable and ambulatory to baseline. Patient verbalized understanding of the discharge instructions.  Patient belongings were taken by the patient.   

## 2019-07-13 NOTE — ED Provider Notes (Signed)
[facility] EMERGENCY DEPARTMENT Provider Note   CSN: [id number] Arrival date & time: 07/13/19  1547     History   Chief Complaint Chief Complaint  Patient presents with  . Loss of Consciousness    HPI Kaitlyn Hunt is a 28 y.o. female.     HPI   Patient is a 28 year old female with a history of ADHD, bipolar, depression, eclampsia, hypothyroidism, who presents to the emergency department today for evaluation of syncopal episode.   Patient underwent C-section 07/01/2019. She has had uncomplicated post operative course. Pt states that has had burning to the LLQ along the incision since her c-section. States that today she felt the pain and started to feel really hot. She then put a warm rag on her abdomen. Then she started to feel lightheaded, her vision and hearing were decreased. She then had syncopal episode. This episode was witnessed by her mother who told her she did not hit her head.  She did not have any seizure-like activity.  States that currently she feels back to her baseline. Denies any pain or injuries from the fall. States incisional pain is at baseline from her surgery.  States she had a similar episode when she was in her teens when she was really upset.   Denies chest pain, shortness of breath, pleuritic pain, fevers, NVD, constipation, dysuria, frequency, urgency. States she has had light vaginal bleeding since her c-section. Denies vaginal discharge. States she has not been sexually active since she was tx for trich 3 weeks ago.  She has had a mild cough since her c-section. Denies other URI sxs.  Past Medical History:  Diagnosis Date  . Amenorrhea 09/23/2014  . 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 ATTENTION DEFICIT, W/HYPERACTIVITY 01/18/2007   Qualifier: Diagnosis of  By: Hoy Morn MD, HEIDI    . Bipolar 1 disorder ([place])    previously on Depakote D/C 5/11 bc pt stated it made her feel lazy   . Depression    not currently taking  bipolar meds, "crashes some when a lot is coming at her, stress"  . Eclampsia in pregnancy    one seizure after delivery of first child  . 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 H/O LEEP 10/25/2018  . Hyperthyroidism   . Low TSH level 04/27/2015  . Ovarian mass, right 02/04/2016  . Pregnancy induced hypertension   . PROBLEMS, BEHAVIORAL NEC 08/03/2007   Qualifier: Diagnosis of  By: Hoy Morn MD, HEIDI    . Trichimoniasis   . Vaginal Pap smear, abnormal     Patient Active Problem List   Diagnosis Date Noted  . Chorioamnionitis, delivered, current hospitalization 07/04/2019  . Preterm premature rupture of membranes (PPROM) delivered at [redacted]w[redacted]d due to Triple I 06/28/2019  . Pregnancy affected by fetal growth restriction 06/18/2019  . History of Eclampsia in prior pregnancy, currently pregnant 01/29/2019  . History of cesarean delivery, antepartum 01/29/2019  . Supervision of high-risk pregnancy 12/14/2018  . Hyperthyroidism 12/14/2018  . CIN III (cervical intraepithelial neoplasia grade III) with severe dysplasia 01/22/2016  . Bipolar disorder ([place]) 10/09/2013  . SYSTOLIC MURMUR [id number]  . GOITER, UNSPECIFIED 10/31/2007    Past Surgical History:  Procedure Laterality Date  . CESAREAN SECTION  2012   Fetal intolerance following epidural  . CESAREAN SECTION N/A 07/01/2019   Procedure: CESAREAN SECTION;  Surgeon: Aletha Halim, MD;  Location: [facility];  Service: Obstetrics;  Laterality: N/A;  . LEEP    . WISDOM  TOOTH EXTRACTION       OB History    Gravida  2   Para  2   Term      Preterm  2   AB      Living  2     SAB      TAB      Ectopic      Multiple  0   Live Births  2            Home Medications    Prior to Admission medications   Medication Sig Start Date End Date Taking? Authorizing Provider  ibuprofen (ADVIL) 800 MG tablet Take 1 tablet (800 mg total) by mouth every 8 (eight) hours. 07/03/19   Truett Mainland, DO   oxyCODONE-acetaminophen (PERCOCET/ROXICET) 5-325 MG tablet Take 1 tablet by mouth every 6 (six) hours as needed. 07/03/19   Truett Mainland, DO  potassium chloride (K-DUR) 10 MEQ tablet Take 1 tablet (10 mEq total) by mouth daily for 5 days. 07/13/19 07/18/19  Darcelle Herrada S, PA-C  Prenatal MV & Min w/FA-DHA (PRENATAL ADULT GUMMY/DHA/FA) 0.4-25 MG CHEW Chew 0.4 mg by mouth daily. 12/25/18   Kathrene Alu, MD    Family History Family History  Problem Relation Age of Onset  . Bipolar disorder Mother   . Bipolar disorder Maternal Aunt   . Bipolar disorder Sister   . Healthy Father     Social History Social History   Tobacco Use  . Smoking status: Former Smoker    Packs/day: 0.50    Types: Cigarettes    Quit date: 12/14/2018    Years since quitting: 0.5  . Smokeless tobacco: Never Used  Substance Use Topics  . Alcohol use: Not Currently  . Drug use: No     Allergies   Patient has no known allergies.   Review of Systems Review of Systems  Constitutional: Negative for chills and fever.  HENT: Negative for ear pain and sore throat.   Eyes: Negative for visual disturbance.  Respiratory: Negative for cough and shortness of breath.   Cardiovascular: Negative for chest pain.  Gastrointestinal: Positive for abdominal pain. Negative for constipation, diarrhea, nausea and vomiting.  Genitourinary: Positive for vaginal bleeding. Negative for dysuria, frequency, hematuria, urgency and vaginal discharge.  Musculoskeletal: Negative for back pain and neck pain.  Skin: Negative for rash.  Neurological: Positive for syncope and light-headedness (resolved). Negative for dizziness, seizures, weakness, numbness and headaches.       No head trauma  All other systems reviewed and are negative.    Physical Exam Updated Vital Signs BP 119/68   Pulse 86   Temp 97.9 F (36.6 C) (Oral)   Resp 16   LMP 11/16/2018   SpO2 100%   Physical Exam Vitals signs and nursing note reviewed.   Constitutional:      General: She is not in acute distress.    Appearance: She is well-developed. She is not ill-appearing or toxic-appearing.  HENT:     Head: Normocephalic and atraumatic.  Eyes:     Extraocular Movements: Extraocular movements intact.     Conjunctiva/sclera: Conjunctivae normal.     Pupils: Pupils are equal, round, and reactive to light.  Neck:     Musculoskeletal: Neck supple.  Cardiovascular:     Rate and Rhythm: Normal rate and regular rhythm.     Heart sounds: Normal heart sounds. No murmur.  Pulmonary:     Effort: Pulmonary effort is normal. No respiratory distress.  Breath sounds: Normal breath sounds. No wheezing, rhonchi or rales.  Abdominal:     General: Bowel sounds are normal. There is no distension.     Palpations: Abdomen is soft.     Tenderness: There is no abdominal tenderness. There is no guarding or rebound.     Comments: C-section scar to lower abdomen is well-healing.  No significant tenderness.  No erythema, warmth, fluctuance or induration.  Musculoskeletal:     Right lower leg: No edema.     Left lower leg: No edema.  Skin:    General: Skin is warm and dry.  Neurological:     Mental Status: She is alert.     Comments: Mental Status:  Alert, thought content appropriate, able to give a coherent history. Speech fluent without evidence of aphasia. Able to follow 2 step commands without difficulty.  Cranial Nerves:  II: pupils equal, round, reactive to light III,IV, VI: ptosis not present, extra-ocular motions intact bilaterally  V,VII: smile symmetric, facial light touch sensation equal VIII: hearing grossly normal to voice  X: uvula elevates symmetrically  XI: bilateral shoulder shrug symmetric and strong XII: midline tongue extension without fassiculations Motor:  Normal tone. 5/5 strength of BUE and BLE major muscle groups including strong and equal grip strength and dorsiflexion/plantar flexion Sensory: light touch normal in all  extremities. Cerebellar: normal finger-to-nose with bilateral upper extremities, normal heel to shin      ED Treatments / Results  Labs (all labs ordered are listed, but only abnormal results are displayed) Labs Reviewed  BASIC METABOLIC PANEL - Abnormal; Notable for the following components:      Result Value   Potassium 3.1 (*)    CO2 19 (*)    Glucose, Bld 124 (*)    All other components within normal limits  CBC - Abnormal; Notable for the following components:   RBC 3.33 (*)    Hemoglobin 10.1 (*)    HCT 31.1 (*)    Platelets 438 (*)    All other components within normal limits  I-STAT BETA HCG BLOOD, ED ([facility], [facility], [facility] ONLY) - Abnormal; Notable for the following components:   I-stat hCG, quantitative 19.3 (*)    All other components within normal limits  URINALYSIS, ROUTINE W REFLEX MICROSCOPIC  RAPID URINE DRUG SCREEN, HOSP PERFORMED  SAMPLE TO BLOOD BANK    EKG EKG Interpretation  Date/Time:  Saturday July 13 2019 15:50:56 EDT Ventricular Rate:  82 PR Interval:  146 QRS Duration: 76 QT Interval:  362 QTC Calculation: 422 R Axis:   80 Text Interpretation:  Normal sinus rhythm Nonspecific T wave abnormality Abnormal ECG When comapred to prior, possible t wave inversion in lead V2.  No STEMI Confirmed by Antony Blackbird [phone]) on 07/13/2019 4:44:11 PM   Radiology No results found.  Procedures Procedures (including critical care time)  Medications Ordered in ED Medications  potassium chloride SA (K-DUR) CR tablet 40 mEq (has no administration in time range)  sodium chloride flush (NS) 0.9 % injection 3 mL (3 mLs Intravenous Given 07/13/19 1743)  sodium chloride 0.9 % bolus 1,000 mL (1,000 mLs Intravenous New Bag/Given 07/13/19 1742)     Initial Impression / Assessment and Plan / ED Course  I have reviewed the triage vital signs and the nursing notes.  Pertinent labs & imaging results that were available during my care of the patient were reviewed by me and  considered in my medical decision making (see chart for details).  Final Clinical Impressions(s) / ED Diagnoses   Final diagnoses:  Vasovagal syncope  Hypokalemia   28 year old female with syncopal episode that occurred prior to arrival.  No head trauma.  No injuries from the fall.  VSS. Orthostatics. Exam reassuring.   Will obtain labs, ua, uds, ekg.  CBC is w/o leukocytosis, anemia present which has improved from prior BMP with hypokalemia, bicarb is slightly low.  No elevated anion gap.  Normal creatinine. Beta-hCG slightly positive which would be expected given her recent delivery.  She has not been sexually active since she delivered. UA pending UDS pending  EKG, NSR, Nonspecific T wave abnormality, Abnormal ECG, When comapred to prior, possible t wave inversion in lead V2.  No STEMI  4:55 PM Discussed case with APP and MAU who stated that slightly positive beta hCG is likely non-concerning and patient who recently delivered and has not had intercourse.  However, she did recommend that following conclusion of the patient's work-up for syncope they would like the patient to be seen in the MAU for evaluation of pain at her incisional site.  UDS, UA are pending at time of shift change.  Care transitioned to [facility], PA-C pending these studies and orthostatic vital signs.  If within normal limits then patient safe for discharge to MAU for evaluation of her incision site.  Suspect her symptoms today are related to vasovagal syncope related to her incisional pain. Doubt emergent cause.  ED Discharge Orders         Ordered    potassium chloride (K-DUR) 10 MEQ tablet  Daily     07/13/19 1837           Bishop Dublin 07/13/19 1848    Tegeler, Gwenyth Allegra, MD 07/13/19 [phone]

## 2019-07-13 NOTE — Discharge Instructions (Addendum)
Please make sure to stay well hydrated. Take potassium as directed.   Please follow up with your OB-GYN within 5-7 days for re-evaluation of your symptoms. If you do not have a primary care provider, information for a healthcare clinic has been provided for you to make arrangements for follow up care. Please return to the emergency department for any new or worsening symptoms.

## 2019-07-15 ENCOUNTER — Other Ambulatory Visit: Payer: Self-pay

## 2019-07-15 ENCOUNTER — Ambulatory Visit (INDEPENDENT_AMBULATORY_CARE_PROVIDER_SITE_OTHER): Payer: Medicaid Other | Admitting: Family Medicine

## 2019-07-15 DIAGNOSIS — Z98891 History of uterine scar from previous surgery: Secondary | ICD-10-CM

## 2019-07-15 HISTORY — DX: History of uterine scar from previous surgery: Z98.891

## 2019-07-15 MED ORDER — LIDOCAINE-PRILOCAINE 2.5-2.5 % EX CREA: 1.0000 "application " | TOPICAL_CREAM | CUTANEOUS | 0 refills | Status: DC | PRN

## 2019-07-15 NOTE — Progress Notes (Signed)
Subjective: Chief Complaint  Patient presents with  . Abdominal Pain    post C-Section   HPI: Kaitlyn Hunt is a 28 y.o. presenting to clinic today to discuss the following:  F/u after C-section Patient is doing well overall but is experiencing some residual left sided lower abdominal pain and numbness just above her surgical scar from her c-section. She states a very similar thing happened on her first c-section. The pain is intermittent and described as burning. Overall, it has slowly improved. She states she has used all the percocet and Ibuprofen doesn't help much. No bleeding, drainage, pus, or odor around the surgical incision. No swelling or redness. It is not tender.  ROS noted in HPI.   Past Medical, Surgical, Social, and Family History Reviewed & Updated per EMR.   Pertinent Historical Findings include:   Social History   Tobacco Use  Smoking Status Former Smoker  . Packs/day: 0.50  . Types: Cigarettes  . Quit date: 12/14/2018  . Years since quitting: 0.5  Smokeless Tobacco Never Used   Objective: LMP 11/16/2018  Vitals and nursing notes reviewed  Physical Exam Gen: Alert and Oriented x 3, NAD Abd: non-distended, non-tender, soft, +bs in all four quadrants; well healing incision with no surrounding erythema or swelling or drainage Ext: no clubbing, cyanosis, or edema Skin: warm, dry, intact, no rashes  Results for orders placed or performed during the hospital encounter of 07/13/19 (from the past 72 hour(s))  Sample to Blood Bank     Status: None   Collection Time: 07/13/19  3:54 PM  Result Value Ref Range   Blood Bank Specimen SAMPLE AVAILABLE FOR TESTING    Sample Expiration      07/14/2019,2359 Performed at [facility], [place] [address] [place], [place] [id number]   Basic metabolic panel     Status: Abnormal   Collection Time: 07/13/19  3:56 PM  Result Value Ref Range   Sodium 137 135 - 145 mmol/L   Potassium 3.1 (L) 3.5 - 5.1 mmol/L   Chloride 108 98 - 111 mmol/L   CO2 19 (L) 22 - 32 mmol/L   Glucose, Bld 124 (H) 70 - 99 mg/dL   BUN 8 6 - 20 mg/dL   Creatinine, Ser 0.68 0.44 - 1.00 mg/dL   Calcium 8.9 8.9 - 10.3 mg/dL   GFR calc non Af Amer >60 >60 mL/min   GFR calc Af Amer >60 >60 mL/min   Anion gap 10 5 - 15    Comment: Performed at [facility], [place] [address] [place], [place] [address]  CBC     Status: Abnormal   Collection Time: 07/13/19  3:56 PM  Result Value Ref Range   WBC 9.9 4.0 - 10.5 K/uL   RBC 3.33 (L) 3.87 - 5.11 MIL/uL   Hemoglobin 10.1 (L) 12.0 - 15.0 g/dL   HCT 31.1 (L) 36.0 - 46.0 %   MCV 93.4 80.0 - 100.0 fL   MCH 30.3 26.0 - 34.0 pg   MCHC 32.5 30.0 - 36.0 g/dL   RDW 13.5 11.5 - 15.5 %   Platelets 438 (H) 150 - 400 K/uL   nRBC 0.0 0.0 - 0.2 %    Comment: Performed at [facility], [place] [address] [place], [place] [address]  I-Stat beta hCG blood, ED     Status: Abnormal   Collection Time: 07/13/19  4:00 PM  Result Value Ref Range   I-stat hCG, quantitative  19.3 (H) <5 mIU/mL   Comment 3            Comment:   GEST. AGE      CONC.  (mIU/mL)   <=1 WEEK        5 - 50     2 WEEKS       50 - 500     3 WEEKS       100 - 10,000     4 WEEKS     1,000 - 30,000        FEMALE AND NON-PREGNANT FEMALE:     LESS THAN 5 mIU/mL   Urinalysis, Routine w reflex microscopic     Status: Abnormal   Collection Time: 07/13/19  6:45 PM  Result Value Ref Range   Color, Urine YELLOW YELLOW   APPearance HAZY (A) CLEAR   Specific Gravity, Urine 1.008 1.005 - 1.030   pH 6.0 5.0 - 8.0   Glucose, UA NEGATIVE NEGATIVE mg/dL   Hgb urine dipstick MODERATE (A) NEGATIVE   Bilirubin Urine NEGATIVE NEGATIVE   Ketones, ur NEGATIVE NEGATIVE mg/dL   Protein, ur NEGATIVE NEGATIVE mg/dL   Nitrite NEGATIVE NEGATIVE   Leukocytes,Ua SMALL (A) NEGATIVE   RBC / HPF 0-5 0 - 5 RBC/hpf   WBC, UA 21-50 0 - 5 WBC/hpf   Bacteria, UA RARE (A) NONE SEEN   Squamous Epithelial / LPF 0-5 0 - 5   Mucus PRESENT      Comment: Performed at [facility], [address], [place], [place] [address]  Rapid urine drug screen (hospital performed)     Status: Abnormal   Collection Time: 07/13/19  6:45 PM  Result Value Ref Range   Opiates NONE DETECTED NONE DETECTED   Cocaine NONE DETECTED NONE DETECTED   Benzodiazepines NONE DETECTED NONE DETECTED   Amphetamines NONE DETECTED NONE DETECTED   Tetrahydrocannabinol POSITIVE (A) NONE DETECTED   Barbiturates NONE DETECTED NONE DETECTED    Comment: (NOTE) DRUG SCREEN FOR MEDICAL PURPOSES ONLY.  IF CONFIRMATION IS NEEDED FOR ANY PURPOSE, NOTIFY LAB WITHIN 5 DAYS. LOWEST DETECTABLE LIMITS FOR URINE DRUG SCREEN Drug Class                     Cutoff (ng/mL) Amphetamine and metabolites    1000 Barbiturate and metabolites    200 Benzodiazepine                 [id number] Tricyclics and metabolites     300 Opiates and metabolites        300 Cocaine and metabolites        300 THC                            50 Performed at [facility], [place] [address], [place], [place] [address]     Assessment/Plan:  History of C-section Patient having neuropathic pain post c-section. Overall, getting better with no signs of infection. - Lidocaine cream for pain - Keep follow up with OB/GYN   PATIENT EDUCATION PROVIDED: See AVS    Diagnosis and plan along with any newly prescribed medication(s) were discussed in detail with this patient today. The patient verbalized understanding and agreed with the plan. Patient advised if symptoms worsen return to clinic or ER.    Meds ordered this encounter  Medications  . lidocaine-prilocaine (EMLA) cream    Sig: Apply 1 application topically as needed.  Dispense:  30 g    Refill:  0     Harolyn Rutherford, DO 07/15/2019, 3:30 PM PGY-3 [facility]

## 2019-07-15 NOTE — Assessment & Plan Note (Signed)
Patient having neuropathic pain post c-section. Overall, getting better with no signs of infection. - Lidocaine cream for pain - Keep follow up with OB/GYN

## 2019-07-15 NOTE — Patient Instructions (Signed)
It was great to meet you today! Thank you for letting me participate in your care!  Today, we discussed your continued pain post c-section. I have sent in a cream that should help with your symptoms.   Please don't forget about your follow up OB appointment on Wednesday 8/26 at 10:20am.  Be well, Harolyn Rutherford, DO PGY-3, [facility]

## 2019-07-16 ENCOUNTER — Encounter: Payer: Self-pay | Admitting: Family Medicine

## 2019-07-16 ENCOUNTER — Telehealth: Payer: Self-pay | Admitting: Family Medicine

## 2019-07-16 NOTE — Telephone Encounter (Signed)
Spoke to patient about her appointment on 8/26 @ 10:20. Patient instructed to wear a face mask for the entire appointment and no visitors are allowed with her during the visit. Patient screened for covid symptoms and denied having any

## 2019-07-17 ENCOUNTER — Ambulatory Visit: Payer: Medicaid Other

## 2019-07-17 ENCOUNTER — Ambulatory Visit (HOSPITAL_COMMUNITY): Payer: Medicaid Other

## 2019-07-19 ENCOUNTER — Ambulatory Visit: Payer: Medicaid Other

## 2019-07-19 ENCOUNTER — Telehealth: Payer: Self-pay | Admitting: Family Medicine

## 2019-07-19 NOTE — Telephone Encounter (Signed)
Attempted to contact patient about her appointment on 8/31 @ 3:15. Patient instructed that the appointment is a mychart visit. Patient instructed to download the mychart app if not already done so. Patient instructed to give the office a call with any concerns

## 2019-07-22 ENCOUNTER — Telehealth: Payer: Medicaid Other | Admitting: Clinical

## 2019-07-22 ENCOUNTER — Other Ambulatory Visit: Payer: Self-pay

## 2019-07-22 DIAGNOSIS — Z5329 Procedure and treatment not carried out because of patient's decision for other reasons: Secondary | ICD-10-CM

## 2019-07-22 DIAGNOSIS — Z91199 Patient's noncompliance with other medical treatment and regimen due to unspecified reason: Secondary | ICD-10-CM

## 2019-07-22 NOTE — BH Specialist Note (Signed)
Pt did not arrive to video visit and did not answer the phone; Left HIPPA-compliant message to call back Roselyn Reef from [facility] at [phone], and left MyChart visit for patient.   [place] via Telemedicine Video Visit  [id number] SHANAI VESSELL [id number]  Garlan Fair

## 2019-07-24 ENCOUNTER — Ambulatory Visit (HOSPITAL_COMMUNITY): Payer: Medicaid Other

## 2019-08-06 ENCOUNTER — Ambulatory Visit: Payer: Medicaid Other | Admitting: Medical

## 2019-08-06 ENCOUNTER — Encounter: Payer: Self-pay | Admitting: Family Medicine

## 2019-11-09 ENCOUNTER — Telehealth: Payer: Self-pay | Admitting: Family Medicine

## 2019-11-09 NOTE — Telephone Encounter (Signed)
**  After Hours/ Emergency Line Call**  Received a call to report that Kaitlyn Hunt is having chest pain.  Endorsing back pain 6-7 days and chest pain since today.  Reports that chest and back hurt when she is breathing but no shortness of breath.  No fevers or chills.  Has been eating normally.  States that hurts in her chest to swallow.  Describes the pain as tightness and pressure, is constant.  Has never had this before.  No heart palpitations.  When she moves, bends over her chest hurts.  Worsens with movement.  Pain is in left side of back and chest.  No radiation.  Denying history of blood clot or recent immobilization.  No recent leg swelling.  Had c-section in August.  No OCPs at present.   Discussed with patient that it is reassuring that pain worsens with movement.  Doubt cardiac in origin give this, age, and lack of risk factors.  PE would be very unlikely given no SOB, immobilization, hormonal concerns, or leg swelling.  Given that pain started in her back x6-7 days ago and is only now having chest pain, likely MSK in origin.  Advised she can go to Urgent care for evaluation if she would like, but do not recommend ED at this time.  Advised to go to ED if symptoms worsen, she develops shortness of breath, swelling in legs, or has other concerns.  Advised she can use tylenol or ibuprofen prn for pain.  If no improvement on Monday morning, to call for appointment.  Red flags discussed.  Will forward to PCP.  [place] Constable, DO PGY-2, [facility] 11/09/2019 6:06 PM

## 2019-12-06 ENCOUNTER — Ambulatory Visit: Payer: Medicaid Other | Admitting: Family Medicine

## 2019-12-12 ENCOUNTER — Encounter: Payer: Self-pay | Admitting: Family Medicine

## 2019-12-12 ENCOUNTER — Other Ambulatory Visit (HOSPITAL_COMMUNITY)
Admission: RE | Admit: 2019-12-12 | Discharge: 2019-12-12 | Disposition: A | Discharge status: Home / Self Care | Payer: Medicaid Other | Source: Ambulatory Visit | Attending: Family Medicine | Admitting: Family Medicine

## 2019-12-12 ENCOUNTER — Other Ambulatory Visit: Payer: Self-pay

## 2019-12-12 ENCOUNTER — Ambulatory Visit (INDEPENDENT_AMBULATORY_CARE_PROVIDER_SITE_OTHER): Payer: Medicaid Other | Admitting: Family Medicine

## 2019-12-12 VITALS — BP 102/62 | HR 101 | Wt 94.0 lb

## 2019-12-12 DIAGNOSIS — N898 Other specified noninflammatory disorders of vagina: Secondary | ICD-10-CM | POA: Diagnosis not present

## 2019-12-12 DIAGNOSIS — Z124 Encounter for screening for malignant neoplasm of cervix: Secondary | ICD-10-CM | POA: Diagnosis not present

## 2019-12-12 DIAGNOSIS — Z309 Encounter for contraceptive management, unspecified: Secondary | ICD-10-CM

## 2019-12-12 DIAGNOSIS — R300 Dysuria: Secondary | ICD-10-CM | POA: Diagnosis not present

## 2019-12-12 DIAGNOSIS — Z202 Contact with and (suspected) exposure to infections with a predominantly sexual mode of transmission: Secondary | ICD-10-CM | POA: Diagnosis not present

## 2019-12-12 LAB — POCT UA - MICROSCOPIC ONLY

## 2019-12-12 LAB — POCT URINALYSIS DIP (MANUAL ENTRY)
Blood, UA: NEGATIVE
Glucose, UA: NEGATIVE mg/dL
Ketones, POC UA: NEGATIVE mg/dL
Nitrite, UA: NEGATIVE
Protein Ur, POC: 30 mg/dL — AB
Spec Grav, UA: 1.025 (ref 1.010–1.025)
Urobilinogen, UA: 0.2 E.U./dL
pH, UA: 6 (ref 5.0–8.0)

## 2019-12-12 LAB — POCT WET PREP (WET MOUNT)
Clue Cells Wet Prep Whiff POC: POSITIVE
Trichomonas Wet Prep HPF POC: ABSENT

## 2019-12-12 LAB — POCT URINE PREGNANCY: Preg Test, Ur: NEGATIVE

## 2019-12-12 MED ORDER — MEDROXYPROGESTERONE ACETATE 150 MG/ML IM SUSP
150.0000 mg | Freq: Once | INTRAMUSCULAR | Status: AC
  Administered 2019-12-12: 150 mg via INTRAMUSCULAR

## 2019-12-12 MED ORDER — METRONIDAZOLE 500 MG PO TABS: 500.0000 mg | ORAL_TABLET | Freq: Two times a day (BID) | ORAL | 0 refills | Status: AC

## 2019-12-12 NOTE — Assessment & Plan Note (Addendum)
Wet prep consistent with BV, will send flagyl x7 days to pharmacy. Also obtained GC/CT, trich, HIV, RPR, UA will call with results. Recommend regular condom use.

## 2019-12-12 NOTE — Assessment & Plan Note (Signed)
UPreg negative today. Depo shot provided.

## 2019-12-12 NOTE — Patient Instructions (Addendum)
It was great to see you!  Our plans for today:  - Take the antibiotic for 7 days. - Come back at the appropriate interval for your depo shot.  We are checking some labs today, we will call you or send you a letter with these results.    Take care and seek immediate care sooner if you develop any concerns.   Dr. [facility]

## 2019-12-12 NOTE — Progress Notes (Signed)
  Subjective:   Patient ID: Kaitlyn Hunt    DOB: November 27, 1990, 29 y.o. female   MRN: [id number]  Kaitlyn Hunt is a 29 y.o. 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 Recommended f/u pap in 1 year. - a little abnl vaginal d/c, milky. Some burning and itching. Some dysuria. No rashes or ulcers. Some concern for STI. No hematuria.  - LMP 11/24/19, has not had sex since period  Review of Systems:  Per HPI.  Medications and smoking status reviewed.  Objective:   BP 102/62   Pulse (!) 101   Wt 94 lb (42.6 kg)   LMP 11/24/2019   SpO2 98%   BMI 16.65 kg/m  Vitals and nursing note reviewed.  General: well nourished, well developed, in no acute distress with non-toxic appearance GYN:  External genitalia within normal limits.  Vaginal mucosa pink, moist, normal rugae.  Nonfriable cervix without lesions, no discharge or bleeding noted on speculum exam.  Bimanual exam revealed normal, nongravid uterus.  No cervical motion tenderness. No adnexal masses bilaterally.   Skin: warm, dry, no rashes or lesions  Assessment & Plan:   Vaginal discharge Wet prep consistent with BV, will send flagyl x7 days to pharmacy. Also obtained GC/CT, trich, HIV, RPR, UA will call with results. Recommend regular condom use.   Contraception management UPreg negative today. Depo shot provided.  Health Maintenance Obtained pap today, will call with results.  Orders Placed This Encounter  Procedures  . HIV antibody (with reflex)  . RPR  . POCT urine pregnancy  . POCT Wet Prep [employer])  . Urinalysis Dipstick  . POCT urinalysis dipstick   Meds ordered this encounter  Medications  . metroNIDAZOLE (FLAGYL) 500 MG tablet    Sig: Take 1 tablet  (500 mg total) by mouth 2 (two) times daily for 7 days.    Dispense:  14 tablet    Refill:  0  . medroxyPROGESTERone (DEPO-PROVERA) injection 150 mg    Rory Percy, DO PGY-3, [facility] 12/12/2019 11:59 AM

## 2019-12-13 ENCOUNTER — Other Ambulatory Visit: Payer: Self-pay | Admitting: Family Medicine

## 2019-12-13 ENCOUNTER — Telehealth: Payer: Self-pay

## 2019-12-13 LAB — HIV ANTIBODY (ROUTINE TESTING W REFLEX): HIV Screen 4th Generation wRfx: NONREACTIVE

## 2019-12-13 LAB — RPR: RPR Ser Ql: NONREACTIVE

## 2019-12-13 MED ORDER — METRONIDAZOLE 0.75 % VA GEL: 1.0000 | Freq: Every day | VAGINAL | 0 refills | Status: AC

## 2019-12-13 NOTE — Telephone Encounter (Signed)
Rx sent to pharmacy   

## 2019-12-13 NOTE — Telephone Encounter (Signed)
Patient calls nurse line stating she would rather have the gel verses taking 7 days worth of flagyl, due to upset stomach. I informed her insurance companies typically do not pay for the gel, however she stated she would try to pay cash price. Please advise. Will send to ordering provider.

## 2019-12-16 ENCOUNTER — Telehealth: Payer: Self-pay | Admitting: Family Medicine

## 2019-12-16 LAB — CYTOLOGY - PAP
Chlamydia: POSITIVE — AB
Comment: NEGATIVE
Comment: NEGATIVE
Comment: NEGATIVE
Comment: NORMAL
Diagnosis: NEGATIVE
High risk HPV: NEGATIVE
Neisseria Gonorrhea: NEGATIVE
Trichomonas: NEGATIVE

## 2019-12-16 NOTE — Telephone Encounter (Signed)
LVM instructing to call clinic about labs. Tested positive for chlamydia.  Should come to clinic for 1g azithromycin under supervision.   Rory Percy, DO PGY-3, [facility] 12/16/2019 4:16 PM

## 2019-12-17 ENCOUNTER — Ambulatory Visit (INDEPENDENT_AMBULATORY_CARE_PROVIDER_SITE_OTHER): Payer: Medicaid Other

## 2019-12-17 ENCOUNTER — Other Ambulatory Visit: Payer: Self-pay

## 2019-12-17 DIAGNOSIS — Z202 Contact with and (suspected) exposure to infections with a predominantly sexual mode of transmission: Secondary | ICD-10-CM

## 2019-12-17 MED ORDER — AZITHROMYCIN 500 MG PO TABS
1000.0000 mg | ORAL_TABLET | Freq: Once | ORAL | Status: AC
  Administered 2019-12-17: 11:00:00 1000 mg via ORAL

## 2019-12-17 NOTE — Progress Notes (Signed)
Patient in nurse clinic today for STD treatment of chlamydia.    Patient advised to abstain from sex for 7-10 days after treatment of self and partner.    Azithromycin 1 GM PO x 1 given per Dr. Mary Sella orders.  Patient to follow up in 2-3 months for re-screening.    Provided condoms and advised to use with all sexual activity. Patient verbalized understanding.   STD report form fax completed and faxed to [facility] Department at [phone] (STD department).     Talbot Grumbling, RN

## 2020-05-11 ENCOUNTER — Encounter: Payer: Self-pay | Admitting: Family Medicine

## 2020-05-12 ENCOUNTER — Other Ambulatory Visit (HOSPITAL_COMMUNITY)
Admission: RE | Admit: 2020-05-12 | Discharge: 2020-05-12 | Disposition: A | Discharge status: Home / Self Care | Payer: Medicaid Other | Source: Ambulatory Visit | Attending: Family Medicine | Admitting: Family Medicine

## 2020-05-12 ENCOUNTER — Ambulatory Visit (INDEPENDENT_AMBULATORY_CARE_PROVIDER_SITE_OTHER): Payer: Medicaid Other | Admitting: Family Medicine

## 2020-05-12 ENCOUNTER — Other Ambulatory Visit: Payer: Self-pay

## 2020-05-12 ENCOUNTER — Encounter: Payer: Self-pay | Admitting: Family Medicine

## 2020-05-12 VITALS — BP 108/70 | HR 94 | Ht 63.0 in | Wt 106.0 lb

## 2020-05-12 DIAGNOSIS — F3170 Bipolar disorder, currently in remission, most recent episode unspecified: Secondary | ICD-10-CM

## 2020-05-12 DIAGNOSIS — Z309 Encounter for contraceptive management, unspecified: Secondary | ICD-10-CM

## 2020-05-12 DIAGNOSIS — R300 Dysuria: Secondary | ICD-10-CM

## 2020-05-12 DIAGNOSIS — D649 Anemia, unspecified: Secondary | ICD-10-CM

## 2020-05-12 DIAGNOSIS — N898 Other specified noninflammatory disorders of vagina: Secondary | ICD-10-CM | POA: Diagnosis not present

## 2020-05-12 DIAGNOSIS — E0590 Thyrotoxicosis, unspecified without thyrotoxic crisis or storm: Secondary | ICD-10-CM | POA: Diagnosis not present

## 2020-05-12 DIAGNOSIS — Z1159 Encounter for screening for other viral diseases: Secondary | ICD-10-CM | POA: Diagnosis not present

## 2020-05-12 LAB — POCT URINALYSIS DIP (MANUAL ENTRY)
Bilirubin, UA: NEGATIVE
Blood, UA: NEGATIVE
Glucose, UA: NEGATIVE mg/dL
Ketones, POC UA: NEGATIVE mg/dL
Leukocytes, UA: NEGATIVE
Nitrite, UA: NEGATIVE
Protein Ur, POC: NEGATIVE mg/dL
Spec Grav, UA: 1.025 (ref 1.010–1.025)
Urobilinogen, UA: 0.2 E.U./dL
pH, UA: 6.5 (ref 5.0–8.0)

## 2020-05-12 LAB — POCT WET PREP (WET MOUNT)
Clue Cells Wet Prep Whiff POC: POSITIVE
Trichomonas Wet Prep HPF POC: ABSENT

## 2020-05-12 LAB — POCT URINE PREGNANCY: Preg Test, Ur: NEGATIVE

## 2020-05-12 MED ORDER — METRONIDAZOLE 500 MG PO TABS: 500.0000 mg | ORAL_TABLET | Freq: Two times a day (BID) | ORAL | 0 refills | Status: DC

## 2020-05-12 MED ORDER — MEDROXYPROGESTERONE ACETATE 150 MG/ML IM SUSY
150.0000 mg | PREFILLED_SYRINGE | INTRAMUSCULAR | Status: AC
  Administered 2020-05-12: 150 mg via INTRAMUSCULAR

## 2020-05-12 NOTE — Assessment & Plan Note (Signed)
Anemia panel obtained today. I will contact her with the result.

## 2020-05-12 NOTE — Assessment & Plan Note (Signed)
TSH checked today. No thyromegaly.

## 2020-05-12 NOTE — Assessment & Plan Note (Signed)
Off meds. Psych resources provided during this visit. [facility] referral placed. Unclear if she will be contacted by [facility]. I also discussed her with Casimer Lanius to offer additional support and resources. Return precaution discussed.

## 2020-05-12 NOTE — Progress Notes (Signed)
    SUBJECTIVE:   CHIEF COMPLAINT / HPI:  Chlamydia infection/Contraceptive management: S/P A/B treated. Here for f/u. Endorses new discharges. Still sexually active.  Abdominal pain: C/O suprapubic cramping on and off. Started about 3 months ago. Last episode was 4 days ago. No known trigger and it is self resolving. Currently asymptomatic. Endorses occasional dysuria.  Hyperthyroidism:Here for f/u.  Anemia:Denies any symptoms.   Bipolar: Off medication due to cost. She will like to get back on treatment. Denies SI or HI.   PERTINENT  PMH / PSH: PMX reviewed  OBJECTIVE:   BP 108/70   Pulse 94   Ht 5\' 3"  (1.6 m)   Wt 106 lb (48.1 kg)   LMP 04/21/2020 (Exact Date)   SpO2 98%   Breastfeeding No   BMI 18.78 kg/m   Physical Exam Vitals and nursing note reviewed. Exam conducted with a chaperone present (Jazmin [name]).  Cardiovascular:     Rate and Rhythm: Normal rate and regular rhythm.     Pulses: Normal pulses.     Heart sounds: Normal heart sounds. No murmur heard.   Pulmonary:     Effort: Pulmonary effort is normal. No respiratory distress.     Breath sounds: Normal breath sounds. No stridor. No wheezing or rhonchi.  Abdominal:     General: Abdomen is flat. Bowel sounds are normal. There is no distension.     Palpations: Abdomen is soft. There is no mass.     Tenderness: There is no abdominal tenderness.  Genitourinary:    Pubic Area: No rash.      Vagina: Vaginal discharge present.     Cervix: Discharge present. No cervical motion tenderness.     Uterus: Normal.      Adnexa: Right adnexa normal and left adnexa normal.      ASSESSMENT/PLAN:  Contraceptive management: Upreg negative. Depo shot given.  Bipolar disorder Off meds. Psych resources provided during this visit. [facility] referral placed on epic. Unclear if she will be contacted by [facility]. I also discussed her with Casimer Lanius to offer additional support and resources. Return  precaution discussed.  Normocytic anemia Anemia panel obtained today. I will contact her with the result.  Hyperthyroidism TSH checked today. No thyromegaly.    Suprapubic pain. Abdominal exam benign. UA negative. Currently asymptomatic. Monitor closely. Pelvic U/S in the future if no improvement.  Hx of Chlamydia infection Test of cure completed today. I will call with the result.   Andrena Mews, MD [place]

## 2020-05-12 NOTE — Patient Instructions (Signed)
Psychiatry Resource List (Adults and Children) Most of these providers will take Medicaid. please consult your insurance for a complete and updated list of available providers. When calling to make an appointment have your insurance information available to confirm you are covered.  [place]:   [place]: [address]     [phone]   Linna Hoff: [place],        [phone] [place]: [place],    [place]: [place],                   [phone] [place]: [place] [address]         [phone]  [place]  (Psychiatry & counseling ; adults & children ; will take Medicaid) [place], [place], [place]        [phone]   [place]  (Psychiatry only; Adults /children 12 and over, will take Medicaid)  [place], [place], [place] [address]       [phone]   [place] (Psychiatry & counseling ; adults & children ; will take Medicaid [address]   [place] [address]   Go on-line to complete referral ( [email or web address]/en/make-[email or web address] [email or web address]    (Spanish therapist)  [facility]  Psychiatry & counseling; Adults and children;  Call Registration prior to scheduling an appointment [phone] [address]    [place], [place] [address]    [phone]  [facility] (Psychiatry & counseling; adults & children; Medicare no Medicaid)  [place], [place]  [address]      [phone]    [facility] (up to age 72)  Psychiatry & counseling ,will take Medicaid, must do counseling to receive psychiatry services  [address] [place] [address]        ([place] (Psychiatry & counseling; adults & children; will take  Medicaid) Will need a referral from provider [address],  [place], [place]  [phone]  [facility]---  Walk-in Mon-Fri, 8:30-5:00 (will take Medicaid)  [address] [place], [place]  [phone]  To schedule an appointment call [phone]  [facility] --- Walk-In Mon-Friday 8am-3pm ( will take Medicaid, Psychiatry, Adults & children,  [address], [place], [place]   [phone]   [facility]--, Walk-in M-F 8am-12pm and 1pm -3pm   (Counseling, Psychiatry, will take Medicaid, adults & children)  [address], [place], [place]  [phone]

## 2020-05-13 ENCOUNTER — Encounter: Payer: Self-pay | Admitting: Family Medicine

## 2020-05-13 LAB — ANEMIA PROFILE B
Basophils Absolute: 0 10*3/uL (ref 0.0–0.2)
Basos: 1 %
EOS (ABSOLUTE): 0.2 10*3/uL (ref 0.0–0.4)
Eos: 2 %
Ferritin: 23 ng/mL (ref 15–150)
Folate: 4.7 ng/mL (ref >3.0)
Hematocrit: 43.3 % (ref 34.0–46.6)
Hemoglobin: 14.4 g/dL (ref 11.1–15.9)
Immature Grans (Abs): 0 10*3/uL (ref 0.0–0.1)
Immature Granulocytes: 0 %
Iron Saturation: 33 % (ref 15–55)
Iron: 131 ug/dL (ref 27–159)
Lymphocytes Absolute: 2 10*3/uL (ref 0.7–3.1)
Lymphs: 24 %
MCH: 30.3 pg (ref 26.6–33.0)
MCHC: 33.3 g/dL (ref 31.5–35.7)
MCV: 91 fL (ref 79–97)
Monocytes Absolute: 0.4 10*3/uL (ref 0.1–0.9)
Monocytes: 5 %
Neutrophils Absolute: 5.6 10*3/uL (ref 1.4–7.0)
Neutrophils: 68 %
Platelets: 285 10*3/uL (ref 150–450)
RBC: 4.75 x10E6/uL (ref 3.77–5.28)
RDW: 13.6 % (ref 11.7–15.4)
Retic Ct Pct: 1.2 % (ref 0.6–2.6)
Total Iron Binding Capacity: 395 ug/dL (ref 250–450)
UIBC: 264 ug/dL (ref 131–425)
Vitamin B-12: 308 pg/mL (ref 232–1245)
WBC: 8.3 10*3/uL (ref 3.4–10.8)

## 2020-05-13 LAB — CERVICOVAGINAL ANCILLARY ONLY
Chlamydia: NEGATIVE
Comment: NEGATIVE
Comment: NORMAL
Neisseria Gonorrhea: NEGATIVE

## 2020-05-13 LAB — TSH: TSH: 0.498 u[IU]/mL (ref 0.450–4.500)

## 2020-05-13 LAB — HEPATITIS C ANTIBODY: Hep C Virus Ab: <0.1 s/co ratio (ref 0.0–0.9)

## 2020-05-13 MED ORDER — METRONIDAZOLE 0.75 % VA GEL: 1.0000 | Freq: Every day | VAGINAL | 0 refills | Status: AC

## 2020-05-13 NOTE — Telephone Encounter (Signed)
She requested MetroGel instead of Oral Metronidazole.   I called her pharmacy and spoke with Merleen Nicely.  Verbal order given to cancel Metronidazole 500 mg BID.  I called in MetroGel prescription.  Merleen Nicely read back prescription.

## 2020-05-15 ENCOUNTER — Ambulatory Visit: Payer: Self-pay | Admitting: Licensed Clinical Social Worker

## 2020-05-15 NOTE — Chronic Care Management (AMB) (Signed)
    Clinical Social Work  Care Management Outreach   9/53/9672 Name: Kaitlyn Hunt MRN: [id number] DOB: [birth year]  Kaitlyn Hunt is a 29 y.o. year old female who is a primary care patient of [name], MD .  The Care Management team was consulted for assistance with connecting to psychiatry services.   LCSW reached out to Kaitlyn Hunt today by phone to introduce self, assess needs and offer Care Management services and interventions.   Plan: Phone appointment scheduled with LCSW 05/21/2020  Review of patient status, including review of consultants reports, relevant laboratory and other test results, and collaboration with appropriate care team members and the patient's provider was performed as part of comprehensive patient evaluation and provision of care management services.    Casimer Lanius, [place] / [place]   [phone] 11:01 AM

## 2020-05-21 ENCOUNTER — Telehealth: Payer: Medicaid Other | Admitting: Licensed Clinical Social Worker

## 2020-05-21 ENCOUNTER — Ambulatory Visit: Payer: Self-pay | Admitting: Licensed Clinical Social Worker

## 2020-05-21 NOTE — Chronic Care Management (AMB) (Signed)
    Clinical Social Work  Care Management 2nd Outreach   01/25/164 Name: ZAIDE MCCLENAHAN MRN: [id number] DOB: [birth year]  [name] is a 29 y.o. year old female who is a primary care patient of [name], MD .  The Care Management team was consulted for assistance with Mental Health Counseling and Resources.  Phone appointment with  [name] to introduce self, assess needs and offer Care Management services and interventions. Patient was unable to talk and requested that LCSW call back in the PM.  LCSW called patient at designated time. The outreach was unsuccessful. A HIPPA compliant phone message was left for the patient providing contact information and requesting a return call.   Plan: LCSW will wait for return call  Review of patient status, including review of consultants reports, relevant laboratory and other test results, and collaboration with appropriate care team members and the patient's provider was performed as part of comprehensive patient evaluation and provision of care management services.    Casimer Lanius, [place] / [place]   [phone] 2:49 PM

## 2020-06-02 ENCOUNTER — Telehealth: Payer: Self-pay | Admitting: Family Medicine

## 2020-06-02 NOTE — Chronic Care Management (AMB) (Signed)
  Care Management   Note  8/33/5825 Name: [name] MRN: [id number] DOB: [birth year]  Kaitlyn Hunt is a 29 y.o. year old female who is a primary care patient of [name], MD and is actively engaged with the care management team. I reached out to Kaitlyn Hunt by phone today to assist with re-scheduling an initial visit with the Licensed Clinical Education officer, museum.  Follow up plan: Unsuccessful telephone outreach attempt made. A HIPPA compliant phone message was left for the patient providing contact information and requesting a return call. The care management team will reach out to the patient again over the next 7 days. If patient returns call to provider office, please advise to call [facility] [place], [place] [facility]  [place], [place] [address] Direct Dial: [email or web address] Website: [email or web address]

## 2020-06-16 NOTE — Chronic Care Management (AMB) (Signed)
  Care Management   Note  6/86/1683 Name: Kaitlyn Hunt MRN: [id number] DOB: [birth year]  Kaitlyn Hunt is a 29 y.o. year old female who is a primary care patient of [place], Phill Myron, MD. I reached out to Kaitlyn Hunt by phone today in response to a referral sent by Kaitlyn Hunt's health plan.    Kaitlyn Hunt was given information about care management services today including:  1. Care management services include personalized support from designated clinical staff supervised by her physician, including individualized plan of care and coordination with other care providers 2. 24/7 contact phone numbers for assistance for urgent and routine care needs. 3. The patient may stop care management services at any time by phone call to the office staff.  Patient agreed to services and verbal consent obtained.   Follow up plan: Telephone appointment with care management team member scheduled for:06/25/2020  Annalee Meyerhoff  Care Guide, [place] [facility]  [place], [place] [address] Direct Dial: [email or web address] Website: [email or web address]

## 2020-06-25 ENCOUNTER — Telehealth: Payer: Medicaid Other

## 2020-06-25 ENCOUNTER — Telehealth: Payer: Self-pay | Admitting: Licensed Clinical Social Worker

## 2020-06-25 NOTE — Chronic Care Management (AMB) (Signed)
    Clinical Social Work  Care Management Outreach   0/06/1447 Name: Kaitlyn Hunt MRN: [id number] DOB: [birth year]  Kaitlyn Hunt is a 29 y.o. year old female who is a primary care patient of [name], MD .  The Care Management team was consulted for assistance with connecting to psychiatry and counseling  LCSW reached out to Kaitlyn Hunt today by phone to introduce self, assess needs and offer Care Management services and interventions.  The outreach was unsuccessful. A HIPPA compliant phone message was left for the patient providing contact information and requesting a return call.   Plan: LCSW will wait for return call.  Review of patient status, including review of consultants reports, relevant laboratory and other test results, and collaboration with appropriate care team members and the patient's provider was performed as part of comprehensive patient evaluation and provision of care management services.    Casimer Lanius, [place] / [place]   [phone] 2:08 PM

## 2020-06-26 ENCOUNTER — Telehealth: Payer: Self-pay | Admitting: Licensed Clinical Social Worker

## 2020-06-26 NOTE — Chronic Care Management (AMB) (Signed)
   Social Work Care Management  3rd Unsuccessful  Phone Outreach  06/24/6658 Name: BERNIS STECHER MRN: [id number] DOB: 09/25/1991  Referred by: [name], MD Reason for referral : Care Coordination ;.  JANELIS STELZER is a 29 y.o. year old female who sees [name], MD for primary care. unsuccessful telephone outreach attempt to Ms. [name] today.  A HIPPA compliant phone message was left for the patient providing contact information and requesting a return call.  Review of patient status, including review of consultants reports, relevant laboratory and other test results, and collaboration with appropriate care team members and the patient's provider was performed as part of comprehensive patient evaluation and provision of care management services.   Plan: LCSW will discontinue outreach calls but will gladly be available at any time to provide services to Ms. [name] when she calls.    Casimer Lanius, [place] / [place]   [phone] 11:28 AM

## 2020-07-15 ENCOUNTER — Encounter: Payer: Self-pay | Admitting: Family Medicine

## 2020-09-28 ENCOUNTER — Encounter: Payer: Self-pay | Admitting: Family Medicine

## 2020-09-28 ENCOUNTER — Other Ambulatory Visit: Payer: Self-pay

## 2020-09-28 ENCOUNTER — Other Ambulatory Visit (HOSPITAL_COMMUNITY)
Admission: RE | Admit: 2020-09-28 | Discharge: 2020-09-28 | Disposition: A | Discharge status: Home / Self Care | Payer: Medicaid Other | Source: Ambulatory Visit | Attending: Family Medicine | Admitting: Family Medicine

## 2020-09-28 ENCOUNTER — Ambulatory Visit (INDEPENDENT_AMBULATORY_CARE_PROVIDER_SITE_OTHER): Payer: Medicaid Other | Admitting: Family Medicine

## 2020-09-28 VITALS — BP 120/64 | HR 97 | Ht 63.0 in | Wt 105.0 lb

## 2020-09-28 DIAGNOSIS — Z111 Encounter for screening for respiratory tuberculosis: Secondary | ICD-10-CM | POA: Insufficient documentation

## 2020-09-28 DIAGNOSIS — Z113 Encounter for screening for infections with a predominantly sexual mode of transmission: Secondary | ICD-10-CM | POA: Insufficient documentation

## 2020-09-28 DIAGNOSIS — B9689 Other specified bacterial agents as the cause of diseases classified elsewhere: Secondary | ICD-10-CM | POA: Diagnosis not present

## 2020-09-28 DIAGNOSIS — N760 Acute vaginitis: Secondary | ICD-10-CM | POA: Diagnosis not present

## 2020-09-28 LAB — POCT WET PREP (WET MOUNT)
Clue Cells Wet Prep Whiff POC: POSITIVE
Trichomonas Wet Prep HPF POC: ABSENT

## 2020-09-28 MED ORDER — METRONIDAZOLE 0.75 % VA GEL: 1.0000 | Freq: Every day | VAGINAL | 0 refills | Status: DC

## 2020-09-28 NOTE — Progress Notes (Signed)
    SUBJECTIVE:   CHIEF COMPLAINT / HPI:   STD screening: Patient reports that she last had sexual course in October.  She has since then had then white/clear discharge without odor.  She states she had this earlier in the year, and was told that she had a "bacterial infection".  Patient was also seen on 1/22 by Dr. Ky Barban for similar issue and was given metronidazole vaginally x5 days with resolution of sympotms.  Patient reports minimal vaginal pruritus.  TB screening: Patient also reports that she needs her TB screen today.  PERTINENT  PMH / PSH: Reviewed  OBJECTIVE:   BP 120/64   Pulse 97   Ht 5\' 3"  (1.6 m)   Wt 105 lb (47.6 kg)   LMP 08/24/2020   SpO2 100%   BMI 18.60 kg/m   Gen: well-appearing, NAD, well-nourished Pelvic exam: cervical os appears red and irritated, thin white discharge in vaginal vault, no lesions noted. Chaperones present: Dr. Wendy Poet, Cherrie Distance   ASSESSMENT/PLAN:   Screening for STD (sexually transmitted disease) GC/Ch swab, wet prep, HIV, and RPR screening done today. - Will call patient with results and treatment if necessary  Screening-pulmonary TB Interferon gold ordered today.      Kaitlyn Hunt, [place]

## 2020-09-28 NOTE — Patient Instructions (Signed)
It was so great seeing you today. We will call you with the results from your testing today. If you have any questions or concerns or worsening of your symptoms please contact our office. If there is any treatment that is needed, we will call you and send in the medication.

## 2020-09-28 NOTE — Assessment & Plan Note (Addendum)
GC/Ch swab, wet prep, HIV, and RPR screening done today. -Positive for clue cells, sending patient metronidazole gel x5 days.

## 2020-09-28 NOTE — Assessment & Plan Note (Signed)
Interferon gold ordered today.

## 2020-09-29 LAB — CERVICOVAGINAL ANCILLARY ONLY
Chlamydia: NEGATIVE
Comment: NEGATIVE
Comment: NEGATIVE
Comment: NORMAL
Neisseria Gonorrhea: NEGATIVE
Trichomonas: NEGATIVE

## 2020-09-30 LAB — QUANTIFERON-TB GOLD PLUS
QuantiFERON Mitogen Value: >10 IU/mL
QuantiFERON Nil Value: 0.01 IU/mL
QuantiFERON TB1 Ag Value: 0.01 IU/mL
QuantiFERON TB2 Ag Value: 0.01 IU/mL
QuantiFERON-TB Gold Plus: NEGATIVE

## 2020-09-30 LAB — HIV ANTIBODY (ROUTINE TESTING W REFLEX): HIV Screen 4th Generation wRfx: NONREACTIVE

## 2020-09-30 LAB — RPR: RPR Ser Ql: NONREACTIVE

## 2020-10-28 ENCOUNTER — Telehealth: Payer: Self-pay | Admitting: Family Medicine

## 2020-10-28 NOTE — Telephone Encounter (Signed)
I contacted her to schedule PAP, COVID-19 and flu shot appointment.  She stated that she does not take the flu shot and will not get COVID shot.  PAP appointment scheduled for January.

## 2020-12-01 ENCOUNTER — Ambulatory Visit: Payer: Medicaid Other | Admitting: Family Medicine

## 2020-12-08 ENCOUNTER — Ambulatory Visit: Payer: Medicaid Other | Admitting: Family Medicine

## 2021-01-28 ENCOUNTER — Encounter: Payer: Self-pay | Admitting: Family Medicine

## 2021-02-05 ENCOUNTER — Other Ambulatory Visit (HOSPITAL_COMMUNITY)
Admission: RE | Admit: 2021-02-05 | Discharge: 2021-02-05 | Disposition: A | Discharge status: Home / Self Care | Payer: Medicaid Other | Source: Ambulatory Visit | Attending: Family Medicine | Admitting: Family Medicine

## 2021-02-05 ENCOUNTER — Encounter: Payer: Self-pay | Admitting: Family Medicine

## 2021-02-05 ENCOUNTER — Other Ambulatory Visit: Payer: Self-pay

## 2021-02-05 ENCOUNTER — Ambulatory Visit (INDEPENDENT_AMBULATORY_CARE_PROVIDER_SITE_OTHER): Payer: Medicaid Other | Admitting: Family Medicine

## 2021-02-05 VITALS — BP 129/75 | HR 90 | Ht 63.0 in | Wt 103.2 lb

## 2021-02-05 DIAGNOSIS — M25561 Pain in right knee: Secondary | ICD-10-CM

## 2021-02-05 DIAGNOSIS — Z113 Encounter for screening for infections with a predominantly sexual mode of transmission: Secondary | ICD-10-CM

## 2021-02-05 DIAGNOSIS — G8929 Other chronic pain: Secondary | ICD-10-CM

## 2021-02-05 DIAGNOSIS — R079 Chest pain, unspecified: Secondary | ICD-10-CM

## 2021-02-05 DIAGNOSIS — Z124 Encounter for screening for malignant neoplasm of cervix: Secondary | ICD-10-CM | POA: Insufficient documentation

## 2021-02-05 DIAGNOSIS — N760 Acute vaginitis: Secondary | ICD-10-CM

## 2021-02-05 LAB — POCT WET PREP (WET MOUNT)
Clue Cells Wet Prep Whiff POC: NEGATIVE
Trichomonas Wet Prep HPF POC: ABSENT

## 2021-02-05 MED ORDER — IBUPROFEN 400 MG PO TABS: 400.0000 mg | ORAL_TABLET | Freq: Three times a day (TID) | ORAL | 1 refills | Status: DC | PRN

## 2021-02-05 MED ORDER — CYCLOBENZAPRINE HCL 5 MG PO TABS: 5.0000 mg | ORAL_TABLET | Freq: Two times a day (BID) | ORAL | 0 refills | Status: AC | PRN
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
## 2021-02-05 NOTE — Patient Instructions (Signed)
Journal for Nurse Practitioners, 15(4), 263-267. Retrieved August 27, 2018 from http://clinicalkey.com/nursing">  Knee Exercises Ask your health care provider which exercises are safe for you. Do exercises exactly as told by your health care provider and adjust them as directed. It is normal to feel mild stretching, pulling, tightness, or discomfort as you do these exercises. Stop right away if you feel sudden pain or your pain gets worse. Do not begin these exercises until told by your health care provider. Stretching and range-of-motion exercises These exercises warm up your muscles and joints and improve the movement and flexibility of your knee. These exercises also help to relieve pain and swelling. Knee extension, prone 1. Lie on your abdomen (prone position) on a bed. 2. Place your left / right knee just beyond the edge of the surface so your knee is not on the bed. You can put a towel under your left / right thigh just above your kneecap for comfort. 3. Relax your leg muscles and allow gravity to straighten your knee (extension). You should feel a stretch behind your left / right knee. 4. Hold this position for __________ seconds. 5. Scoot up so your knee is supported between repetitions. Repeat __________ times. Complete this exercise __________ times a day. Knee flexion, active 1. Lie on your back with both legs straight. If this causes back discomfort, bend your left / right knee so your foot is flat on the floor. 2. Slowly slide your left / right heel back toward your buttocks. Stop when you feel a gentle stretch in the front of your knee or thigh (flexion). 3. Hold this position for __________ seconds. 4. Slowly slide your left / right heel back to the starting position. Repeat __________ times. Complete this exercise __________ times a day.   Quadriceps stretch, prone 1. Lie on your abdomen on a firm surface, such as a bed or padded floor. 2. Bend your left / right knee and hold  your ankle. If you cannot reach your ankle or pant leg, loop a belt around your foot and grab the belt instead. 3. Gently pull your heel toward your buttocks. Your knee should not slide out to the side. You should feel a stretch in the front of your thigh and knee (quadriceps). 4. Hold this position for __________ seconds. Repeat __________ times. Complete this exercise __________ times a day.   Hamstring, supine 1. Lie on your back (supine position). 2. Loop a belt or towel over the ball of your left / right foot. The ball of your foot is on the walking surface, right under your toes. 3. Straighten your left / right knee and slowly pull on the belt to raise your leg until you feel a gentle stretch behind your knee (hamstring). ? Do not let your knee bend while you do this. ? Keep your other leg flat on the floor. 4. Hold this position for __________ seconds. Repeat __________ times. Complete this exercise __________ times a day. Strengthening exercises These exercises build strength and endurance in your knee. Endurance is the ability to use your muscles for a long time, even after they get tired. Quadriceps, isometric This exercise stretches the muscles in front of your thigh (quadriceps) without moving your knee joint (isometric). 1. Lie on your back with your left / right leg extended and your other knee bent. Put a rolled towel or small pillow under your knee if told by your health care provider. 2. Slowly tense the muscles in the front of your   left / right thigh. You should see your kneecap slide up toward your hip or see increased dimpling just above the knee. This motion will push the back of the knee toward the floor. 3. For __________ seconds, hold the muscle as tight as you can without increasing your pain. 4. Relax the muscles slowly and completely. Repeat __________ times. Complete this exercise __________ times a day.   Straight leg raises This exercise stretches the muscles in  front of your thigh (quadriceps) and the muscles that move your hips (hip flexors). 1. Lie on your back with your left / right leg extended and your other knee bent. 2. Tense the muscles in the front of your left / right thigh. You should see your kneecap slide up or see increased dimpling just above the knee. Your thigh may even shake a bit. 3. Keep these muscles tight as you raise your leg 4-6 inches (10-15 cm) off the floor. Do not let your knee bend. 4. Hold this position for __________ seconds. 5. Keep these muscles tense as you lower your leg. 6. Relax your muscles slowly and completely after each repetition. Repeat __________ times. Complete this exercise __________ times a day. Hamstring, isometric 1. Lie on your back on a firm surface. 2. Bend your left / right knee about __________ degrees. 3. Dig your left / right heel into the surface as if you are trying to pull it toward your buttocks. Tighten the muscles in the back of your thighs (hamstring) to "dig" as hard as you can without increasing any pain. 4. Hold this position for __________ seconds. 5. Release the tension gradually and allow your muscles to relax completely for __________ seconds after each repetition. Repeat __________ times. Complete this exercise __________ times a day. Hamstring curls If told by your health care provider, do this exercise while wearing ankle weights. Begin with __________ lb weights. Then increase the weight by 1 lb (0.5 kg) increments. Do not wear ankle weights that are more than __________ lb. 1. Lie on your abdomen with your legs straight. 2. Bend your left / right knee as far as you can without feeling pain. Keep your hips flat against the floor. 3. Hold this position for __________ seconds. 4. Slowly lower your leg to the starting position. Repeat __________ times. Complete this exercise __________ times a day.   Squats This exercise strengthens the muscles in front of your thigh and knee  (quadriceps). 1. Stand in front of a table, with your feet and knees pointing straight ahead. You may rest your hands on the table for balance but not for support. 2. Slowly bend your knees and lower your hips like you are going to sit in a chair. ? Keep your weight over your heels, not over your toes. ? Keep your lower legs upright so they are parallel with the table legs. ? Do not let your hips go lower than your knees. ? Do not bend lower than told by your health care provider. ? If your knee pain increases, do not bend as low. 3. Hold the squat position for __________ seconds. 4. Slowly push with your legs to return to standing. Do not use your hands to pull yourself to standing. Repeat __________ times. Complete this exercise __________ times a day. Wall slides This exercise strengthens the muscles in front of your thigh and knee (quadriceps). 1. Lean your back against a smooth wall or door, and walk your feet out 18-24 inches (46-61 cm) from it. 2.   Place your feet hip-width apart. 3. Slowly slide down the wall or door until your knees bend __________ degrees. Keep your knees over your heels, not over your toes. Keep your knees in line with your hips. 4. Hold this position for __________ seconds. Repeat __________ times. Complete this exercise __________ times a day.   Straight leg raises This exercise strengthens the muscles that rotate the leg at the hip and move it away from your body (hip abductors). 1. Lie on your side with your left / right leg in the top position. Lie so your head, shoulder, knee, and hip line up. You may bend your bottom knee to help you keep your balance. 2. Roll your hips slightly forward so your hips are stacked directly over each other and your left / right knee is facing forward. 3. Leading with your heel, lift your top leg 4-6 inches (10-15 cm). You should feel the muscles in your outer hip lifting. ? Do not let your foot drift forward. ? Do not let your  knee roll toward the ceiling. 4. Hold this position for __________ seconds. 5. Slowly return your leg to the starting position. 6. Let your muscles relax completely after each repetition. Repeat __________ times. Complete this exercise __________ times a day.   Straight leg raises This exercise stretches the muscles that move your hips away from the front of the pelvis (hip extensors). 1. Lie on your abdomen on a firm surface. You can put a pillow under your hips if that is more comfortable. 2. Tense the muscles in your buttocks and lift your left / right leg about 4-6 inches (10-15 cm). Keep your knee straight as you lift your leg. 3. Hold this position for __________ seconds. 4. Slowly lower your leg to the starting position. 5. Let your leg relax completely after each repetition. Repeat __________ times. Complete this exercise __________ times a day. This information is not intended to replace advice given to you by your health care provider. Make sure you discuss any questions you have with your health care provider. Document Revised: 08/28/2018 Document Reviewed: 08/28/2018 Elsevier Patient Education  2021 [employer]  

## 2021-02-09 ENCOUNTER — Telehealth: Payer: Self-pay | Admitting: *Deleted

## 2021-02-09 LAB — CYTOLOGY - PAP
Chlamydia: NEGATIVE
Comment: NEGATIVE
Comment: NORMAL
Diagnosis: NEGATIVE
Neisseria Gonorrhea: NEGATIVE

## 2021-02-09 NOTE — Telephone Encounter (Signed)
Patient need to be seen for this.

## 2021-02-09 NOTE — Telephone Encounter (Signed)
Patient called referral line asking for a note for work today.  States that she had to call out of work today due to the pain from her knee/leg and is planning to get the xray done today.  She would like to know if she can get a note for her being out.  She is also going to check with her job about possible accommodations for when the pain is worse.  She complains of it feeling "weak and may give out" at times.  Will forward to MD. [name]

## 2021-02-15 NOTE — Telephone Encounter (Signed)
Spoke with pt informed me that she has an x-ray for tomorrow, and that she saw Dr. Gwendlyn Deutscher last week. Salvatore Marvel, CMA

## 2021-03-06 ENCOUNTER — Encounter (HOSPITAL_COMMUNITY): Payer: Self-pay

## 2021-03-06 ENCOUNTER — Ambulatory Visit (HOSPITAL_COMMUNITY)
Admission: EM | Admit: 2021-03-06 | Discharge: 2021-03-06 | Disposition: A | Discharge status: Home / Self Care | Payer: Medicaid Other | Attending: Physician Assistant | Admitting: Physician Assistant

## 2021-03-06 ENCOUNTER — Other Ambulatory Visit: Payer: Self-pay

## 2021-03-06 DIAGNOSIS — M79604 Pain in right leg: Secondary | ICD-10-CM | POA: Diagnosis not present

## 2021-03-06 MED ORDER — KETOROLAC TROMETHAMINE 30 MG/ML IJ SOLN
INTRAMUSCULAR | Status: AC
  Filled 2021-03-06: qty 1

## 2021-03-06 MED ORDER — CYCLOBENZAPRINE HCL 5 MG PO TABS: 5.0000 mg | ORAL_TABLET | Freq: Three times a day (TID) | ORAL | 0 refills | Status: DC | PRN

## 2021-03-06 MED ORDER — KETOROLAC TROMETHAMINE 30 MG/ML IJ SOLN
30.0000 mg | Freq: Once | INTRAMUSCULAR | Status: AC
  Administered 2021-03-06: 30 mg via INTRAMUSCULAR
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
## 2021-03-06 NOTE — Discharge Instructions (Addendum)
Keep follow up with PCP Take flexeril as needed
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
## 2021-03-08 ENCOUNTER — Encounter: Payer: Self-pay | Admitting: Family Medicine

## 2021-04-02 ENCOUNTER — Encounter: Payer: Self-pay | Admitting: Family Medicine

## 2021-04-05 IMAGING — US US MFM UA CORD DOPPLER
1 series · 14 of 14 positions shown · non-contrast
Comparison: none

[Series 1: us mfm ua cord doppler · 14 acquisitions, 14 frames shown]
[im 1/14]
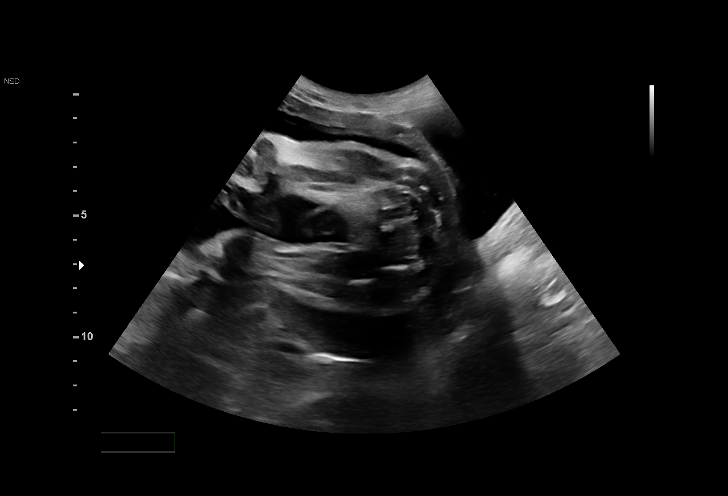
[im 2/14]
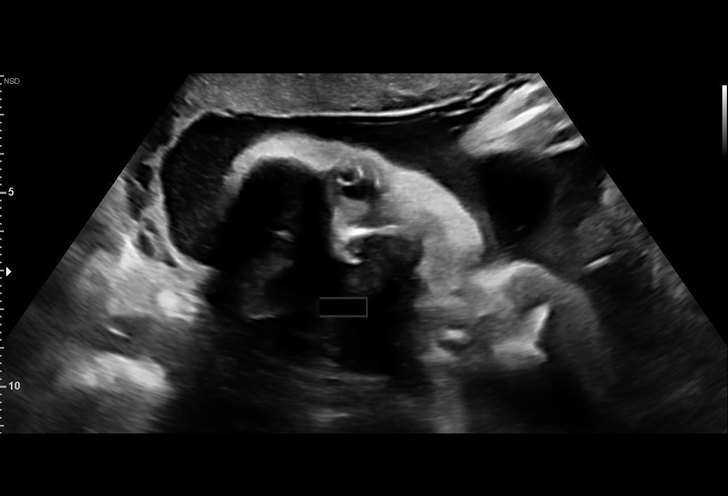
[im 3/14]
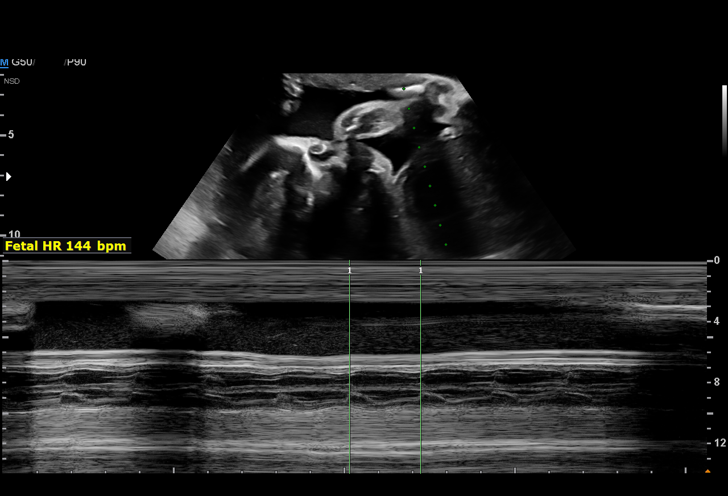
[im 4/14]
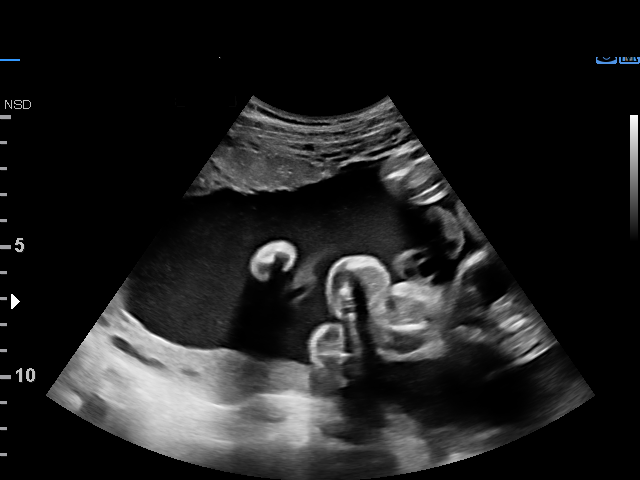
[im 5/14]
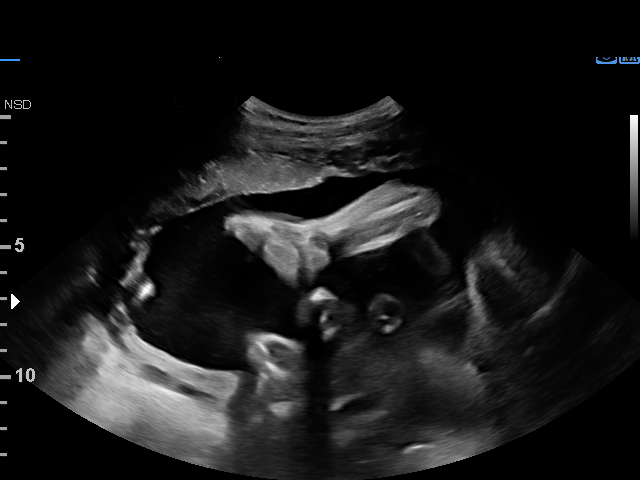
[im 6/14]
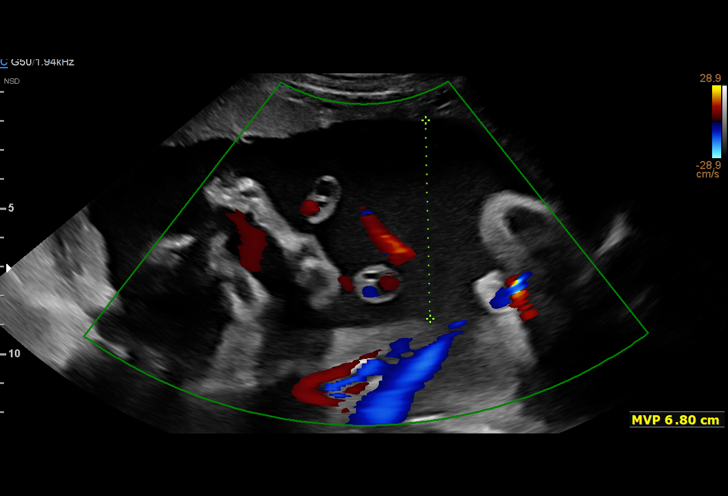
[im 7/14]
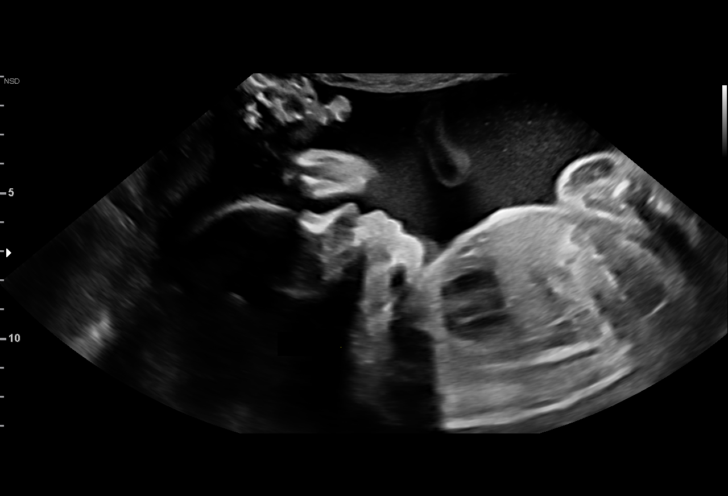
[im 8/14]
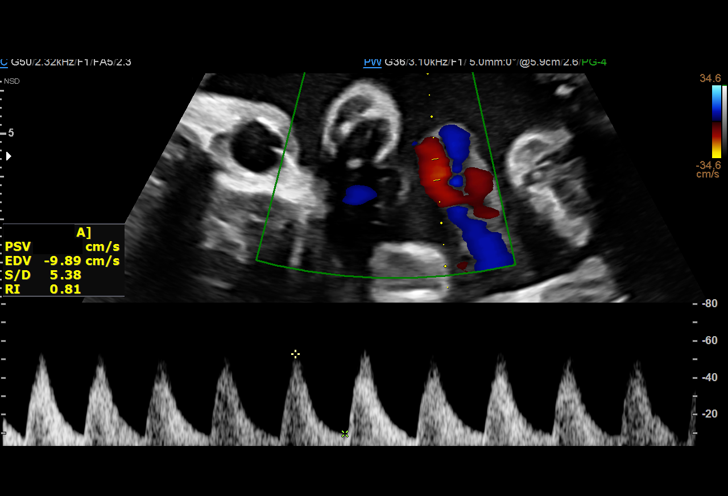
[im 9/14]
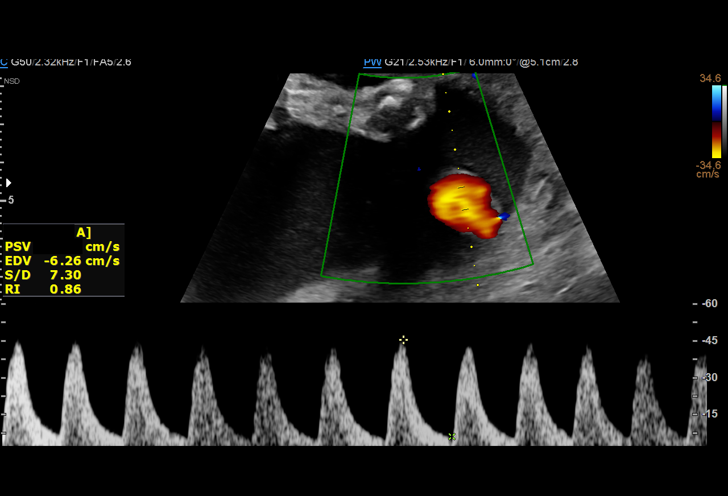
[im 10/14]
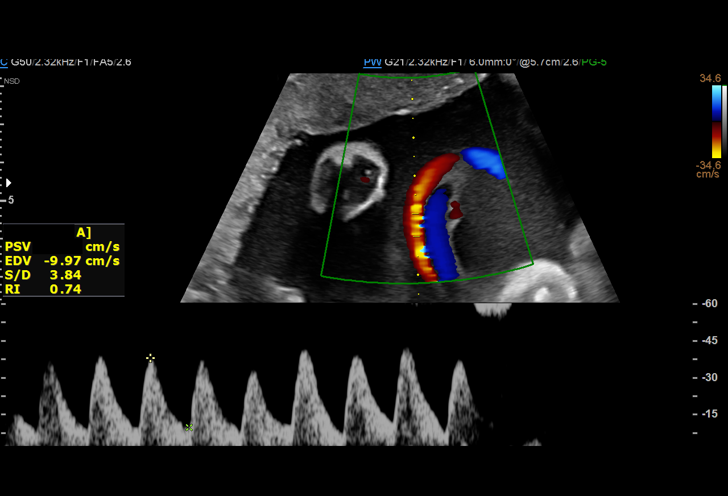
[im 11/14]
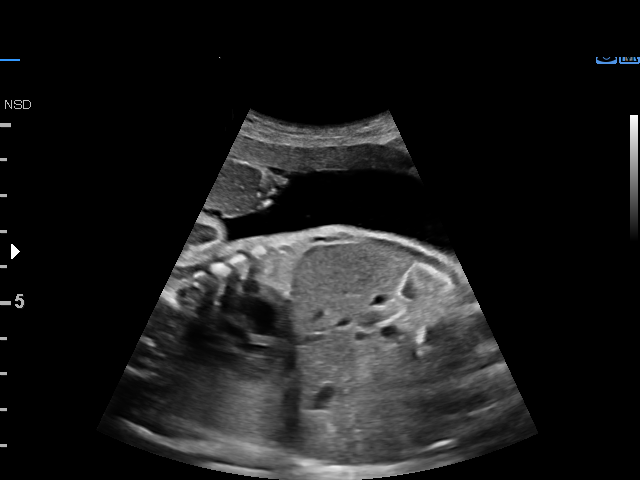
[im 12/14]
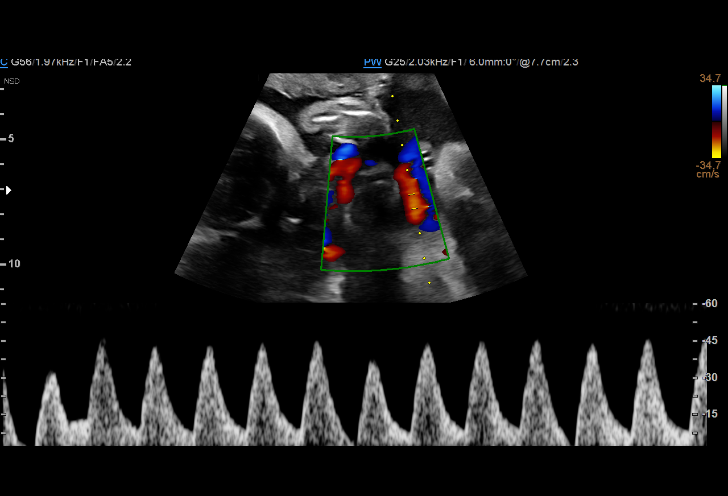
[im 13/14]
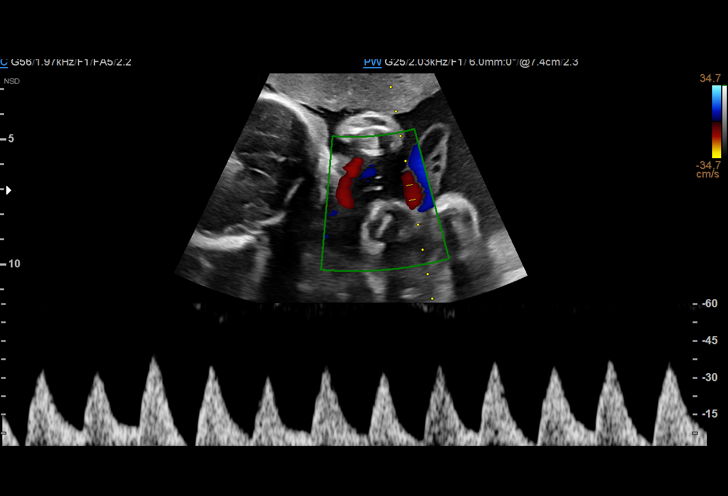
[im 14/14]
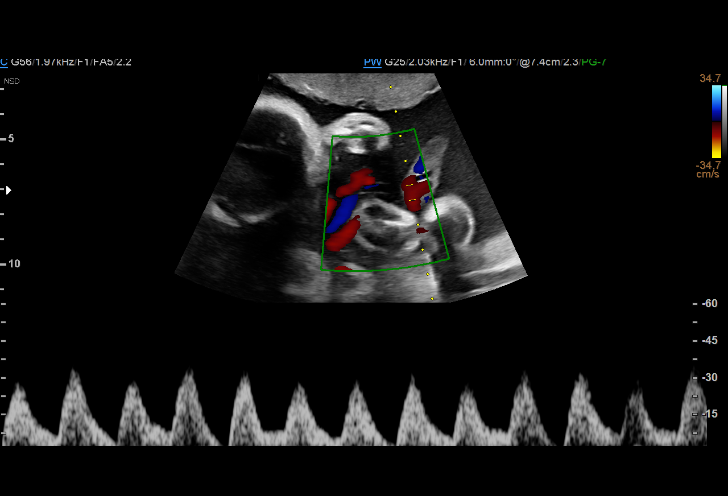

[14 of 14 positions shown; findings below may reference images not displayed]

1  US MFM UA CORD DOPPLER               76820.02     [place]
                                                       BART
 ----------------------------------------------------------------------

 ----------------------------------------------------------------------
Indications

  Encounter for fetal growth retardation
  28 weeks gestation of pregnancy
  Hyperthyroid
  Genetic carrier ([facility])
  Poor obstetric history: Previous
  preeclampsia / eclampsia/gestational HTN
  Poor obstetric history: Previous preterm
  delivery, antepartum 34 wk for preeclampsia
  History of cesarean delivery, currently
  pregnant
  Small for gestational age fetus affecting
  management of mother
  Encounter for other antenatal screening
  follow-up
 ----------------------------------------------------------------------
Vital Signs

                                                Height:        5'3"
Fetal Evaluation

 Num Of Fetuses:         1
 Fetal Heart Rate(bpm):  144
 Cardiac Activity:       Observed
 Presentation:           Breech

 Amniotic Fluid
 AFI FV:      Within normal limits

                             Largest Pocket(cm)

OB History

 Gravidity:    2         Term:   0        Prem:   1        SAB:   0
 TOP:          0       Ectopic:  0        Living: 1
Gestational Age

 LMP:           28w 5d        Date:  11/16/18                 EDD:   08/23/19
 Best:          28w 5d     Det. By:  LMP  (11/16/18)          EDD:   08/23/19
Doppler - Fetal Vessels

 Umbilical Artery
  S/D     %tile
 4.61    >

Impression

 Known IUGR
 Elevated UA Dopplers
Recommendations

 Follow up UA Dopplers and NST on [REDACTED]- If again elevated
 will increase Doppler assessment to twice weekly.
 Repeat UA dopplers and NST next week-

## 2021-04-14 ENCOUNTER — Encounter: Payer: Self-pay | Admitting: Family Medicine

## 2021-08-17 ENCOUNTER — Encounter (HOSPITAL_COMMUNITY): Payer: Self-pay | Admitting: Emergency Medicine

## 2021-08-17 ENCOUNTER — Other Ambulatory Visit: Payer: Self-pay

## 2021-08-17 ENCOUNTER — Ambulatory Visit (HOSPITAL_COMMUNITY)
Admission: EM | Admit: 2021-08-17 | Discharge: 2021-08-17 | Disposition: A | Discharge status: Home / Self Care | Payer: Medicaid Other | Attending: Emergency Medicine | Admitting: Emergency Medicine

## 2021-08-17 ENCOUNTER — Ambulatory Visit (INDEPENDENT_AMBULATORY_CARE_PROVIDER_SITE_OTHER): Payer: Medicaid Other

## 2021-08-17 DIAGNOSIS — S62339A Displaced fracture of neck of unspecified metacarpal bone, initial encounter for closed fracture: Secondary | ICD-10-CM

## 2021-08-17 DIAGNOSIS — M7989 Other specified soft tissue disorders: Secondary | ICD-10-CM | POA: Diagnosis not present

## 2021-08-17 DIAGNOSIS — M25531 Pain in right wrist: Secondary | ICD-10-CM | POA: Diagnosis not present

## 2021-08-17 DIAGNOSIS — S62316A Displaced fracture of base of fifth metacarpal bone, right hand, initial encounter for closed fracture: Secondary | ICD-10-CM

## 2021-08-17 DIAGNOSIS — M79641 Pain in right hand: Secondary | ICD-10-CM | POA: Diagnosis not present

## 2021-08-17 MED ORDER — ACETAMINOPHEN 325 MG PO TABS
650.0000 mg | ORAL_TABLET | Freq: Once | ORAL | Status: AC
  Administered 2021-08-17: 650 mg via ORAL

## 2021-08-17 MED ORDER — ACETAMINOPHEN 325 MG PO TABS
ORAL_TABLET | ORAL | Status: AC
  Filled 2021-08-17: qty 2

## 2021-08-17 NOTE — ED Triage Notes (Signed)
Pt c/o right wrist and hand pain from fall on 9/20 Wearing a brace on hand

## 2021-08-17 NOTE — ED Notes (Signed)
Spoke with an ortho tech regarding orders for ulnar gutter. Tech states she is in route.

## 2021-08-17 NOTE — ED Provider Notes (Signed)
[place]    CSN: [id number] Arrival date & time: 08/17/21  2119      History   Chief Complaint Chief Complaint  Patient presents with   Fall   Hand Pain    HPI JOVONNE WILTON is a 30 y.o. female.   30 year old female presents to urgent care chief complaint of right hand pain.  Patient states she fell 1 week ago and has had difficulty with pain and movement of right hand and wrist.  Patient states her last menstrual period was Friday 08/13/21.  Patient has not tried any Tylenol or ibuprofen was wearing wrist splint of mom's.  Patient reports she is right-handed.  The history is provided by the patient. No language interpreter was used.  Fall This is a new problem. Episode onset: 08/10/2021. The problem occurs constantly. The problem has been gradually improving. Exacerbated by: Movement. The symptoms are relieved by rest. She has tried rest for the symptoms. The treatment provided mild relief.   Past Medical History:  Diagnosis Date   Amenorrhea 09/23/2014   ASCUS with positive high risk HPV 05/05/2015   ATTENTION DEFICIT, W/HYPERACTIVITY 01/18/2007   Qualifier: Diagnosis of  By: Hoy Morn MD, HEIDI     Bipolar 1 disorder [facility])    previously on Depakote D/C 5/11 bc pt stated it made her feel lazy    Chorioamnionitis, delivered, current hospitalization 07/04/2019   Confirmed on placental pathology   Depression    not currently taking bipolar meds, "crashes some when a lot is coming at her, stress"   Eclampsia in pregnancy    one seizure after delivery of first child   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    Hyperthyroidism     Low TSH level 04/27/2015   Ovarian mass, right 02/04/2016   Pregnancy affected by fetal growth restriction 06/18/2019   Weekly dopplers and MFM screening   Pregnancy induced hypertension    Preterm premature rupture of membranes (PPROM) delivered at [redacted]w[redacted]d due to Triple I 06/28/2019   PROBLEMS, BEHAVIORAL NEC 08/03/2007   Qualifier: Diagnosis of  By: Hoy Morn MD, HEIDI     Supervision of high-risk pregnancy 12/14/2018    Nursing Staff Provider Office Location  [place] Dating  1st trimester [place] Language   Anatomy US   Flu Vaccine  Declined Genetic Screen  NIPS: Low risk female     TDaP vaccine    Hgb A1C or  GTT  Third trimester: normal Rhogam  n/a   LAB RESULTS  Feeding Plan Bottle Blood Type --/--/O POS Performed at [facility], [address], [place], [place] [address]  [phone])  Contraception  depo pro   Trichimoniasis    Vaginal Pap smear, abnormal     Patient Active Problem List   Diagnosis Date Noted   Boxer's metacarpal fracture, neck, closed 08/17/2021   Right hand pain 08/17/2021   Screening for STD (sexually transmitted disease) 09/28/2020   Screening-pulmonary TB 09/28/2020   Normocytic anemia 05/12/2020   Hyperthyroidism 12/14/2018   CIN III (cervical intraepithelial neoplasia grade III) with severe dysplasia 01/22/2016   Bipolar disorder ([place]) 10/09/2013   GOITER, UNSPECIFIED 10/31/2007  Past Surgical History:  Procedure Laterality Date   CESAREAN SECTION  2012   Fetal intolerance following epidural   CESAREAN SECTION N/A 07/01/2019   Procedure: CESAREAN SECTION;  Surgeon: Aletha Halim, MD;  Location: [facility] LD [facility]S;  Service: Obstetrics;  Laterality: N/A;   LEEP     WISDOM TOOTH EXTRACTION      OB History     Gravida  2   Para  2   Term      Preterm  2   AB      Living  2      SAB      IAB      Ectopic      Multiple  0   Live Births  2            Home Medications    Prior to Admission medications   Medication Sig Start Date End Date  Taking? Authorizing Provider  cyclobenzaprine (FLEXERIL) 5 MG tablet Take 1 tablet (5 mg total) by mouth 3 (three) times daily as needed for muscle spasms. 03/06/21   Ward, Lenise Arena, PA-C  ibuprofen (ADVIL) 400 MG tablet Take 1 tablet (400 mg total) by mouth every 8 (eight) hours as needed for moderate pain or cramping. 02/05/21   [name], MD    Family History Family History  Problem Relation Age of Onset   Bipolar disorder Mother    Bipolar disorder Maternal Aunt    Bipolar disorder Sister    Healthy Father     Social History Social History   Tobacco Use   Smoking status: Former    Packs/day: 0.50    Types: Cigarettes    Quit date: 12/14/2018    Years since quitting: 2.6   Smokeless tobacco: Never  Vaping Use   Vaping Use: Never used  Substance Use Topics   Alcohol use: Not Currently   Drug use: No     Allergies   Patient has no known allergies.   Review of Systems Review of Systems  Musculoskeletal:  Positive for myalgias.  Skin:  Negative for wound.  All other systems reviewed and are negative.   Physical Exam Triage Vital Signs ED Triage Vitals  Enc Vitals Group     BP 08/17/21 1003 114/74     Pulse Rate 08/17/21 1003 75     Resp 08/17/21 1003 17     Temp 08/17/21 1003 98.2 F (36.8 C)     Temp Source 08/17/21 1003 Oral     SpO2 08/17/21 1003 95 %     Weight --      Height --      Head Circumference --      Peak Flow --      Pain Score 08/17/21 1001 10     Pain Loc --      Pain Edu? --      Excl. in [place]? --    No data found.  Updated Vital Signs BP 114/74 (BP Location: Left Arm)   Pulse 75   Temp 98.2 F (36.8 C) (Oral)   Resp 17   LMP 08/10/2021   SpO2 95%   Visual Acuity Right Eye Distance:   Left Eye Distance:   Bilateral Distance:    Right Eye Near:   Left Eye Near:    Bilateral Near:     Physical Exam Vitals and nursing note reviewed.  Constitutional:      Appearance: She is well-developed.  Cardiovascular:  Rate and Rhythm: Normal rate.     Pulses: Normal pulses.          Radial pulses are 2+ on the right side.  Musculoskeletal:     Right hand: Swelling, deformity and bony tenderness present. No lacerations. Decreased range of motion. Normal sensation. There is no disruption of two-point discrimination. Normal capillary refill. Normal pulse.  Neurological:     General: No focal deficit present.     Mental Status: She is alert and oriented to person, place, and time.     GCS: GCS eye subscore is 4. GCS verbal subscore is 5. GCS motor subscore is 6.     Cranial Nerves: Cranial nerves are intact.     Sensory: Sensation is intact.  Psychiatric:        Behavior: Behavior is cooperative.     UC Treatments / Results  Labs (all labs ordered are listed, but only abnormal results are displayed) Labs Reviewed - No data to display  EKG   Radiology DG Wrist Complete Right  Result Date: 08/17/2021 CLINICAL DATA:  Pain with history of injury. EXAM: RIGHT WRIST - COMPLETE 3+ VIEW; RIGHT HAND - COMPLETE 3+ VIEW COMPARISON:  None. FINDINGS: Right hand and wrist: Mildly displaced fracture of the base of the fifth metacarpal with intra-articular extension. There is no evidence of arthropathy or other focal bone abnormality. Soft tissues are unremarkable. Soft tissue swelling of the medial hand. IMPRESSION: Mildly displaced fracture of the base of the fifth metacarpal with intra-articular extension. These results will be called to the ordering clinician or representative by the Radiologist Assistant, and communication documented in the PACS or [employer]. Electronically Signed   By: Yetta Glassman M.D.   On: 08/17/2021 11:24   DG Hand Complete Right  Result Date: 08/17/2021 CLINICAL DATA:  Pain with history of injury. EXAM: RIGHT WRIST - COMPLETE 3+ VIEW; RIGHT HAND - COMPLETE 3+ VIEW COMPARISON:  None. FINDINGS: Right hand and wrist: Mildly displaced fracture of the base of the fifth metacarpal with  intra-articular extension. There is no evidence of arthropathy or other focal bone abnormality. Soft tissues are unremarkable. Soft tissue swelling of the medial hand. IMPRESSION: Mildly displaced fracture of the base of the fifth metacarpal with intra-articular extension. These results will be called to the ordering clinician or representative by the Radiologist Assistant, and communication documented in the PACS or [employer]. Electronically Signed   By: Yetta Glassman M.D.   On: 08/17/2021 11:24    Procedures Procedures (including critical care time)  Medications Ordered in UC Medications  acetaminophen (TYLENOL) tablet 650 mg (650 mg Oral Given 08/17/21 1058)    Initial Impression / Assessment and Plan / UC Course  I have reviewed the triage vital signs and the nursing notes.  Pertinent labs & imaging results that were available during my care of the patient were reviewed by me and considered in my medical decision making (see chart for details).  Clinical Course as of 08/17/21 1229  Tue Aug 17, 2021  1145 Spoke with Dr. Mable Fill, [facility]  Orthopedics on call, regarding pt exam and xray resutls. Recommends ulna gutter splint, call office at [phone] for appt this week/follow up of right dominant hand boxer's fracture.  [JD]    Clinical Course User Index [JD] Blas Riches, Jeanett Schlein, NP    Ddx: Fracture, dislocation, sprain,strain.  NV intact pre/post OCL ulna gutter splint application by [facility] Orth[facility] tech. Pt given verbal and written discharge instructions. Call and follow up with Ortho  this week.  Final Clinical Impressions(s) / UC Diagnoses   Final diagnoses:  Closed boxer's fracture, initial encounter  Right hand pain     Discharge Instructions      Rest,ice,elevate, wear splint until cleared by orthopedics-call office at [phone] for appt this week w Dr. Mable Fill. May alternate tylenol/ibuprofen for pain as label directed.      ED Prescriptions   None    PDMP  not reviewed this encounter.   Tori Milks, NP 57/01/77 1230

## 2021-08-17 NOTE — Discharge Instructions (Signed)
Rest,ice,elevate, wear splint until cleared by orthopedics-call office at [phone] for appt this week w Dr. Mable Fill. May alternate tylenol/ibuprofen for pain as label directed.

## 2021-08-17 NOTE — Progress Notes (Signed)
Orthopedic Tech Progress Note Patient Details:  Kaitlyn Hunt [birth year] [id number]  Ortho Devices Type of Ortho Device: Ulna gutter splint Ortho Device/Splint Location: Right arm Ortho Device/Splint Interventions: Ordered, Application, Adjustment   Post Interventions Patient Tolerated: Fair Instructions Provided: Adjustment of device, Care of device Pt. Was difficult to splint due to length of nails, as well as not keeping still while splint was being applied.  [name] 08/17/2021, 12:24 PM

## 2021-08-19 ENCOUNTER — Other Ambulatory Visit: Payer: Self-pay

## 2021-08-19 DIAGNOSIS — S62316A Displaced fracture of base of fifth metacarpal bone, right hand, initial encounter for closed fracture: Secondary | ICD-10-CM | POA: Diagnosis not present

## 2021-08-19 DIAGNOSIS — M79641 Pain in right hand: Secondary | ICD-10-CM | POA: Diagnosis not present

## 2021-09-07 ENCOUNTER — Encounter (HOSPITAL_COMMUNITY): Payer: Self-pay | Admitting: *Deleted

## 2021-09-07 ENCOUNTER — Ambulatory Visit (INDEPENDENT_AMBULATORY_CARE_PROVIDER_SITE_OTHER): Payer: Medicaid Other

## 2021-09-07 ENCOUNTER — Other Ambulatory Visit: Payer: Self-pay

## 2021-09-07 ENCOUNTER — Ambulatory Visit (HOSPITAL_COMMUNITY)
Admission: EM | Admit: 2021-09-07 | Discharge: 2021-09-07 | Disposition: A | Discharge status: Home / Self Care | Payer: Medicaid Other | Attending: Physician Assistant | Admitting: Physician Assistant

## 2021-09-07 DIAGNOSIS — G8929 Other chronic pain: Secondary | ICD-10-CM

## 2021-09-07 DIAGNOSIS — M25561 Pain in right knee: Secondary | ICD-10-CM

## 2021-09-07 MED ORDER — NAPROXEN 500 MG PO TABS: 500.0000 mg | ORAL_TABLET | Freq: Two times a day (BID) | ORAL | 0 refills | Status: DC

## 2021-09-07 NOTE — ED Triage Notes (Signed)
Pt presents today because her Rt knee locked up last night after work. Pt arrives with wrist splint in RT arm.
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
## 2021-09-07 NOTE — Discharge Instructions (Signed)
Your x-ray was negative.  If you continue have symptoms I recommend you follow-up with an orthopedic provider.  I have called in Naprosyn 500 mg to be taken twice daily.  Do not take additional NSAIDs including aspirin, ibuprofen/Advil, naproxen/Aleve with this medication as cause stomach bleeding.  You can use Tylenol for breakthrough pain.  Keep it elevated and use ice and compression for additional symptom relief.  If you have any worsening symptoms please return for reevaluation.

## 2021-09-08 NOTE — Telephone Encounter (Signed)
Contact PCP

## 2021-09-16 DIAGNOSIS — Z9889 Other specified postprocedural states: Secondary | ICD-10-CM | POA: Diagnosis not present

## 2021-12-22 ENCOUNTER — Ambulatory Visit: Payer: Medicaid Other | Admitting: Family Medicine

## 2021-12-28 ENCOUNTER — Ambulatory Visit (INDEPENDENT_AMBULATORY_CARE_PROVIDER_SITE_OTHER): Payer: Medicaid Other | Admitting: Family Medicine

## 2021-12-28 ENCOUNTER — Other Ambulatory Visit (HOSPITAL_COMMUNITY)
Admission: RE | Admit: 2021-12-28 | Discharge: 2021-12-28 | Disposition: A | Discharge status: Home / Self Care | Payer: Medicaid Other | Source: Ambulatory Visit | Attending: Family Medicine | Admitting: Family Medicine

## 2021-12-28 ENCOUNTER — Encounter: Payer: Self-pay | Admitting: Family Medicine

## 2021-12-28 ENCOUNTER — Other Ambulatory Visit: Payer: Self-pay

## 2021-12-28 ENCOUNTER — Telehealth: Payer: Self-pay | Admitting: Psychology

## 2021-12-28 VITALS — BP 120/87 | HR 100 | Ht 63.0 in | Wt 104.2 lb

## 2021-12-28 DIAGNOSIS — Z124 Encounter for screening for malignant neoplasm of cervix: Secondary | ICD-10-CM | POA: Insufficient documentation

## 2021-12-28 DIAGNOSIS — F3170 Bipolar disorder, currently in remission, most recent episode unspecified: Secondary | ICD-10-CM

## 2021-12-28 DIAGNOSIS — Z113 Encounter for screening for infections with a predominantly sexual mode of transmission: Secondary | ICD-10-CM

## 2021-12-28 DIAGNOSIS — N898 Other specified noninflammatory disorders of vagina: Secondary | ICD-10-CM | POA: Diagnosis not present

## 2021-12-28 DIAGNOSIS — Z114 Encounter for screening for human immunodeficiency virus [HIV]: Secondary | ICD-10-CM | POA: Diagnosis not present

## 2021-12-28 NOTE — Assessment & Plan Note (Signed)
Stable off meds. She does not want to get on meds but is interested in counseling. I discussed starting a short-term intervention session with Dr. Hartford Poli, and she is interested in this. I will message Hartford Poli to connect with her.

## 2021-12-28 NOTE — Patient Instructions (Signed)
Preventing Sexually Transmitted Infections, Adult  Sexually transmitted infections (STIs) are diseases that are spread from person to person (are contagious). They are spread, or transmitted, through bodily fluids exchanged during sex or sexual contact. These bodily fluids include saliva, semen, blood, vaginal mucus, and urine. STIs are very common among people of all ages.  Some common STIs include:  Herpes.  Hepatitis B.  Chlamydia.  Gonorrhea.  Syphilis.  HPV (human papillomavirus).  HIV, also called the human immunodeficiency virus. This is the virus that can cause AIDS (acquired immunodeficiency syndrome).  Often, people who have these STIs do not have symptoms. Even without symptoms, these infections can be spread from person to person and require treatment.  How can these conditions affect me?  STIs can be treated, and many STIs can be cured. However, some STIs cannot be cured and will affect you for the rest of your life.  Certain STIs may:  Require you to take medicine for the rest of your life.  Affect your ability to have children (your fertility).  Increase your risk for developing another STI or certain serious health conditions. These may include:  Cervical cancer.  Head and neck cancer.  Pelvic inflammatory disease (PID), in women.  Organ damage or damage to other parts of your body, if the infection spreads.  Cause problems during pregnancy and may be transmitted to the baby during the pregnancy or childbirth.  What can increase my risk?  You may have an increased risk for developing an STI if:  You have unprotected sex. Sex includes oral, vaginal, or anal sex.  You have more than one sex partner.  You have a sex partner who has multiple sex partners.  You have sex with someone who has an STI.  You have an STI or you had an STI before.  You inject drugs or have a sex partner or partners who inject drugs.  What actions can I take to prevent STIs?  The only way to completely prevent STIs is not to have  sex of any kind. This is called practicing abstinence. If you are sexually active, you can protect yourself and others by taking these actions to lower your risk of getting an STI:  Lifestyle  Avoid mixing alcohol, drugs, and sex. Alcohol and drug use can affect your ability to make good decisions and can lead to risky sexual behaviors.  Medicines  Ask your health care provider about taking pre-exposure prophylaxis (PrEP) to prevent HIV infection.  General information    Stay up to date on vaccinations. Certain vaccines can lower your risk of getting certain STIs, such as:  Hepatitis A and hepatitis B vaccines. You may have been vaccinated as a young child, but you will likely need a booster shot as a teen or young adult.  HPV (human papillomavirus) vaccine.  Have only one sex partner (be monogamous) or limit the number of sex partners you have.  Use methods that prevent the exchange of body fluids between partners (barrier protection) correctly every time you have sex. Barrier protection can be used during oral, vaginal, or anal sex. Commonly used barrier methods include:  Female condom.  Female condom.  Dental dam.  Use a new condom for every sex act from start to finish.  Get tested for STIs. Have your partners get tested, too.  If you test positive for an STI, follow recommendations from your health care provider about treatment and make sure your sex partners are tested and treated.  Birth control   pills, injections, implants, and intrauterine devices (IUDs) do not protect against STIs. To prevent both STIs and pregnancy, always use a condom with another form of birth control.  Some STIs, such as herpes, are spread through skin-to-skin contact. A condom may not protect you from getting such STIs. Avoid all sexual contact if you or your partners have herpes and there is an active flare with open sores.  Where to find more information  Learn more about STIs from:  Centers for Disease Control and Prevention:  More  information about specific STIs: cdc.gov/std  Places to get sexual health counseling and treatment for free or at a low cost: gettested.cdc.gov  U.S. Department of Health and Human Services: www.womenshealth.gov  Summary  Sexually transmitted infections (STIs) can spread through exchanging bodily fluids during sexual contact. Fluids include saliva, semen, blood, vaginal mucus, and urine.  You may have an increased risk for developing an STI if you have unprotected sex. Sex includes oral, vaginal, or anal sex.  If you do have sex, limit your number of sex partners and use barrier protection every time you have sex.  This information is not intended to replace advice given to you by your health care provider. Make sure you discuss any questions you have with your health care provider.  Document Revised: 12/23/2019 Document Reviewed: 12/23/2019  Elsevier Patient Education  2022 Elsevier Inc.

## 2021-12-28 NOTE — Telephone Encounter (Signed)
Scheduled [facility] appt for 2/10 at [place], PhD., LMFT

## 2021-12-28 NOTE — Progress Notes (Signed)
° ° °  SUBJECTIVE:   CHIEF COMPLAINT / HPI:   Cervical cancer screen: Hx of abnormal PAP. She is here for a follow-up.  STD screening:  The last unprotected sex was yesterday. She has been with the same partner for more than 6 months but is uncertain if her partner sees other people. She wants an STD screen to protect herself. She endorses scanty vaginal discharge. Her LPM was 12/24/21. She is not on birth control and does not want to be on one.  Bipolar: She denies any new concerns. She has been off her medication for a while. No SI, or HI. She is interested in talking with a counselor.  PERTINENT  PMH / PSH: PMHx reviewed  OBJECTIVE:   BP 120/87    Pulse 100    Ht 5\' 3"  (1.6 m)    Wt 104 lb 3.2 oz (47.3 kg)    LMP 12/24/2021    SpO2 100%    BMI 18.46 kg/m   Physical Exam Vitals and nursing note reviewed. Exam conducted with a chaperone present Lavell Anchors).  Cardiovascular:     Rate and Rhythm: Normal rate and regular rhythm.     Heart sounds: Normal heart sounds. No murmur heard. Pulmonary:     Effort: Pulmonary effort is normal. No respiratory distress.     Breath sounds: Normal breath sounds. No wheezing.  Abdominal:     General: Abdomen is flat. Bowel sounds are normal. There is no distension.     Palpations: Abdomen is soft. There is no mass.     Tenderness: There is no abdominal tenderness.  Genitourinary:    General: Normal vulva.     Vagina: Normal.     Cervix: Normal.     Comments: Normal vaginal discharge/mucus    ASSESSMENT/PLAN:   Cervical CA screening: PAP completed. If normal, she can return to routine screening. She agreed with the plan.  STD screening. Last unprotected sex was yesterday. GC/Chlamydia, HIV, RPR tested today. I advised her if negative, she might need a repeat test soon especially if symptomatic, given that she only had unprotected sex yesterday. She agreed with the plan. I will contact her soon with results. STD and pregnancy  prevention discussed. She is not interested in birth control since she does not have sex often.   Bipolar: Stable off meds. She does not want to get on meds but is interested in counseling. I discussed starting a short-term intervention session with Dr. Hartford Poli, and she is interested in this. I will message Hartford Poli to connect with her.  Andrena Mews, MD [place]

## 2021-12-29 ENCOUNTER — Telehealth: Payer: Self-pay | Admitting: Family Medicine

## 2021-12-29 LAB — CERVICOVAGINAL ANCILLARY ONLY
Bacterial Vaginitis (gardnerella): POSITIVE — AB
Candida Glabrata: NEGATIVE
Candida Vaginitis: NEGATIVE
Chlamydia: NEGATIVE
Comment: NEGATIVE
Comment: NEGATIVE
Comment: NEGATIVE
Comment: NEGATIVE
Comment: NEGATIVE
Comment: NORMAL
Neisseria Gonorrhea: NEGATIVE
Trichomonas: POSITIVE — AB

## 2021-12-29 LAB — HIV ANTIBODY (ROUTINE TESTING W REFLEX): HIV Screen 4th Generation wRfx: NONREACTIVE

## 2021-12-29 LAB — RPR: RPR Ser Ql: NONREACTIVE

## 2021-12-29 MED ORDER — METRONIDAZOLE 500 MG PO TABS: 500.0000 mg | ORAL_TABLET | Freq: Two times a day (BID) | ORAL | 0 refills | Status: AC

## 2021-12-29 NOTE — Telephone Encounter (Signed)
PAP result is pending.  + BV and Trich discussed.  I advised her to have her partner get tested and treated.  Metronidazole escribed.  She has no other questions.

## 2021-12-30 ENCOUNTER — Encounter: Payer: Self-pay | Admitting: Family Medicine

## 2021-12-30 LAB — CYTOLOGY - PAP
Comment: NEGATIVE
Diagnosis: NEGATIVE
Diagnosis: REACTIVE
High risk HPV: NEGATIVE

## 2021-12-31 ENCOUNTER — Ambulatory Visit: Payer: Medicaid Other | Admitting: Psychology

## 2022-03-04 ENCOUNTER — Ambulatory Visit (INDEPENDENT_AMBULATORY_CARE_PROVIDER_SITE_OTHER): Payer: Medicaid Other | Admitting: Family Medicine

## 2022-03-04 ENCOUNTER — Encounter: Payer: Self-pay | Admitting: Family Medicine

## 2022-03-04 DIAGNOSIS — L42 Pityriasis rosea: Secondary | ICD-10-CM | POA: Diagnosis not present

## 2022-03-04 MED ORDER — PREDNISONE 20 MG PO TABS: 20.0000 mg | ORAL_TABLET | Freq: Every day | ORAL | 0 refills | Status: AC

## 2022-03-04 MED ORDER — DIPHENHYDRAMINE HCL 25 MG PO CAPS: 25.0000 mg | ORAL_CAPSULE | Freq: Three times a day (TID) | ORAL | 0 refills | Status: AC | PRN

## 2022-03-04 MED ORDER — TRIAMCINOLONE ACETONIDE 0.1 % EX CREA: 1.0000 "application " | TOPICAL_CREAM | Freq: Two times a day (BID) | CUTANEOUS | 0 refills | Status: DC

## 2022-03-04 NOTE — Patient Instructions (Addendum)
What Is Pityriasis Rosea? ?Pityriasis rosea (pit-ih-RYE-uh-sis ROE-zee-uh) is a harmless temporary skin condition that's common in older kids and teens. This pink or gray scaly skin rash can last for 4 to 8 weeks -- or, sometimes, months. The rash usually starts with one big patch on the chest, abdomen, thighs, or back that's often mistaken for ringworm. ? ?As the rash spreads, the original patch is joined by a number of smaller spots that spread out across the torso. In some cases, the spots spread to the arms and legs (however, it usually doesn't appear on the palms or soles). The spots can be slightly itchy. ? ? ?Pityriasis rosea is not contagious. Although sometimes the spots take a while to fade completely, most kids have no lasting traces of the rash after it's healed. ? ?What Causes Pityriasis Rosea? ?Doctors aren't really sure what causes pityriasis rosea. Some think it's caused by a virus, but this hasn't been proved. ? ?Pityriasis rosea is more likely to show up in the spring and fall. ? ?What Are the Signs & Symptoms of Pityriasis Rosea? ?Most kids and teens who get pityriasis rosea have no warning signs. Others can have flu-like symptoms (a sore throat, swollen glands, headaches, or feel tired) a few days before the rash appears. ? ?The rash itself usually starts with one large spot, called the herald patch or "mother" patch, which can appear anywhere on the skin but usually is on the chest, abdomen, back, or thighs. This patch can be raised and may feel scaly. In people with light skin, the patch is pink or red. People with darker skin can see a variety of colors, from violet to brown to gray. ? ?The herald patch might be the only sign of this condition for up to 2 to 3 weeks. As the rash grows, however, smaller spots (called "daughter" spots) can appear across the torso and on the arms and legs. The spots look almost identical on both sides of the body. These small patches are usually oval shaped and  often form a pattern on the back that looks like a Christmas tree. ? ?How Is Pityriasis Rosea Diagnosed? ?To diagnose pityriasis rosea, the doctor will examine your child's skin to look for the telltale signs of the rash. Sometimes doctors gently scrape off a few scales from the rash to examine under the microscope to rule out other possible causes, like ringworm or psoriasis. ? ?How Is Pityriasis Rosea Treated? ?Most cases of pityriasis rosea go away in 1 to 2 months without any treatment. Some cases can be as short as 2 weeks, while others can last for 3 months or longer. ? ?When pityriasis rosea does need treatment, it's usually just to control the itching. Over-the-counter itch creams or allergy syrups can help, and so can oatmeal baths. ? ?In some cases, just getting a moderate amount of sunlight can help improve the rash and the itching. If your child uses this form of therapy, make sure he or she is protected from sunburn, which can sometimes make a rash worse. ? ?Light therapy might be prescribed for cases where the itching is really bothersome. Usually, this involves ultraviolet B (UVB) therapy and is done by a dermatologist. ?

## 2022-03-04 NOTE — Assessment & Plan Note (Signed)
Kaitlyn Hunt of the lesion discussed with her. ?This is self limiting and may resolve in 2-4 weeks. ?Symptomatic treatment with oral and topical steroid for itching due to the wide spread nature and severity of her rash. ?Benadryl also prescribed prn itching. ?She was counseled not to use benadryl when driving or operating machinery. ?F/U as needed. ?She agreed with the plan. ?

## 2022-03-04 NOTE — Progress Notes (Signed)
? ? ?  SUBJECTIVE:  ? ?CHIEF COMPLAINT / HPI:  ? ?Skin rash: ?She c/o rash, ongoing x 2 weeks. The rash is worsening, spreading to every part of her body. She endorses associated itching. No fever, no other symptoms. She denies sick contact or contact with someone with a rash.  ? ?PERTINENT  PMH / PSH: PMHx reviewed ? ?OBJECTIVE:  ? ?BP (!) 132/96   Pulse 98   Ht '5\' 3"'$  (1.6 m)   Wt 96 lb 12.8 oz (43.9 kg)   LMP 02/17/2022   SpO2 99%   BMI 17.15 kg/m?   ?Physical Exam ?Vitals and nursing note reviewed.  ?Cardiovascular:  ?   Rate and Rhythm: Normal rate and regular rhythm.  ?   Heart sounds: Normal heart sounds.  ?Pulmonary:  ?   Effort: Pulmonary effort is normal. No respiratory distress.  ?   Breath sounds: Normal breath sounds.  ?Skin: ?   Comments: Widespread macular rash in different sizes and shapes with herald patches on her left lateral abdomen and chest. Rash covers her back, chest, abdomen and limbs.  ?Neurological:  ?   Mental Status: She is alert.  ? ? ? ? ? ? ?ASSESSMENT/PLAN:  ? ?Pityriasis rosea ?Petra Kuba of the lesion discussed with her. ?This is self limiting and may resolve in 2-4 weeks. ?Symptomatic treatment with oral and topical steroid for itching due to the wide spread nature and severity of her rash. ?Benadryl also prescribed prn itching. ?She was counseled not to use benadryl when driving or operating machinery. ?F/U as needed. ?She agreed with the plan. ?  ? ?Andrena Mews, MD ?[place]  ? ?

## 2022-03-09 ENCOUNTER — Encounter: Payer: Self-pay | Admitting: Family Medicine

## 2022-03-09 ENCOUNTER — Telehealth: Payer: Self-pay | Admitting: Family Medicine

## 2022-03-09 NOTE — Telephone Encounter (Signed)
.. ?  Medicaid Managed Care  ? ?Unsuccessful Outreach Note ? ?03/09/2022 ?Name: Kaitlyn Hunt MRN: [id number] DOB: October 01, 1991 ? ?Referred by: [name], MD ?Reason for referral : High Risk Managed Medicaid (I called the patient today to get her scheduled with the MM Team. Her number was not in working order.) ? ? ?An unsuccessful telephone outreach was attempted today. The patient was referred to the case management team for assistance with care management and care coordination.  ? ?Follow Up Plan: The care management team will reach out to the patient again over the next 7-14 days.  ? ? ? ?Reita Chard ?Care Guide, High Risk Medicaid Managed Care ?Embedded Care Coordination ?[place]  ? ? ?SIGNATURE  ?

## 2022-03-10 ENCOUNTER — Other Ambulatory Visit: Payer: Self-pay | Admitting: Family Medicine

## 2022-03-10 MED ORDER — HALOBETASOL PROPIONATE 0.05 % EX CREA: TOPICAL_CREAM | Freq: Two times a day (BID) | CUTANEOUS | 0 refills | Status: AC

## 2022-04-12 ENCOUNTER — Emergency Department (HOSPITAL_COMMUNITY)
Admission: EM | Admit: 2022-04-12 | Discharge: 2022-04-12 | Disposition: A | Discharge status: Home / Self Care | Payer: Medicaid Other | Attending: Emergency Medicine | Admitting: Emergency Medicine

## 2022-04-12 ENCOUNTER — Encounter (HOSPITAL_COMMUNITY): Payer: Self-pay

## 2022-04-12 ENCOUNTER — Other Ambulatory Visit: Payer: Self-pay

## 2022-04-12 ENCOUNTER — Emergency Department (HOSPITAL_COMMUNITY): Payer: Medicaid Other

## 2022-04-12 DIAGNOSIS — M542 Cervicalgia: Secondary | ICD-10-CM

## 2022-04-12 DIAGNOSIS — S199XXA Unspecified injury of neck, initial encounter: Secondary | ICD-10-CM | POA: Diagnosis not present

## 2022-04-12 DIAGNOSIS — S0990XA Unspecified injury of head, initial encounter: Secondary | ICD-10-CM | POA: Diagnosis not present

## 2022-04-12 DIAGNOSIS — M25511 Pain in right shoulder: Secondary | ICD-10-CM

## 2022-04-12 DIAGNOSIS — S62316A Displaced fracture of base of fifth metacarpal bone, right hand, initial encounter for closed fracture: Secondary | ICD-10-CM | POA: Diagnosis not present

## 2022-04-12 DIAGNOSIS — M25551 Pain in right hip: Secondary | ICD-10-CM | POA: Diagnosis not present

## 2022-04-12 DIAGNOSIS — Z041 Encounter for examination and observation following transport accident: Secondary | ICD-10-CM | POA: Diagnosis not present

## 2022-04-12 DIAGNOSIS — R519 Headache, unspecified: Secondary | ICD-10-CM | POA: Diagnosis not present

## 2022-04-12 DIAGNOSIS — S4991XA Unspecified injury of right shoulder and upper arm, initial encounter: Secondary | ICD-10-CM | POA: Diagnosis present

## 2022-04-12 DIAGNOSIS — Y92410 Unspecified street and highway as the place of occurrence of the external cause: Secondary | ICD-10-CM | POA: Diagnosis not present

## 2022-04-12 DIAGNOSIS — S42114A Nondisplaced fracture of body of scapula, right shoulder, initial encounter for closed fracture: Secondary | ICD-10-CM | POA: Diagnosis not present

## 2022-04-12 DIAGNOSIS — S0993XA Unspecified injury of face, initial encounter: Secondary | ICD-10-CM | POA: Diagnosis not present

## 2022-04-12 LAB — I-STAT CHEM 8, ED
BUN: 6 mg/dL (ref 6–20)
Calcium, Ion: 1.1 mmol/L — ABNORMAL LOW (ref 1.15–1.40)
Chloride: 103 mmol/L (ref 98–111)
Creatinine, Ser: 1 mg/dL (ref 0.44–1.00)
Glucose, Bld: 91 mg/dL (ref 70–99)
HCT: 43 % (ref 36.0–46.0)
Hemoglobin: 14.6 g/dL (ref 12.0–15.0)
Potassium: 3.2 mmol/L — ABNORMAL LOW (ref 3.5–5.1)
Sodium: 143 mmol/L (ref 135–145)
TCO2: 24 mmol/L (ref 22–32)

## 2022-04-12 LAB — I-STAT BETA HCG BLOOD, ED (MC, WL, AP ONLY): I-stat hCG, quantitative: <5 m[IU]/mL (ref <5)

## 2022-04-12 LAB — CBC WITH DIFFERENTIAL/PLATELET
Abs Immature Granulocytes: 0.03 10*3/uL (ref 0.00–0.07)
Basophils Absolute: 0.1 10*3/uL (ref 0.0–0.1)
Basophils Relative: 1 %
Eosinophils Absolute: 0.1 10*3/uL (ref 0.0–0.5)
Eosinophils Relative: 1 %
HCT: 42.5 % (ref 36.0–46.0)
Hemoglobin: 14.5 g/dL (ref 12.0–15.0)
Immature Granulocytes: 0 %
Lymphocytes Relative: 28 %
Lymphs Abs: 2.9 10*3/uL (ref 0.7–4.0)
MCH: 31.8 pg (ref 26.0–34.0)
MCHC: 34.1 g/dL (ref 30.0–36.0)
MCV: 93.2 fL (ref 80.0–100.0)
Monocytes Absolute: 0.6 10*3/uL (ref 0.1–1.0)
Monocytes Relative: 5 %
Neutro Abs: 6.8 10*3/uL (ref 1.7–7.7)
Neutrophils Relative %: 65 %
Platelets: 269 10*3/uL (ref 150–400)
RBC: 4.56 MIL/uL (ref 3.87–5.11)
RDW: 13.2 % (ref 11.5–15.5)
WBC: 10.5 10*3/uL (ref 4.0–10.5)
nRBC: 0 % (ref 0.0–0.2)

## 2022-04-12 LAB — BASIC METABOLIC PANEL
Anion gap: 9 (ref 5–15)
BUN: 8 mg/dL (ref 6–20)
CO2: 26 mmol/L (ref 22–32)
Calcium: 9.2 mg/dL (ref 8.9–10.3)
Chloride: 107 mmol/L (ref 98–111)
Creatinine, Ser: 0.76 mg/dL (ref 0.44–1.00)
GFR, Estimated: >60 mL/min (ref >60)
Glucose, Bld: 91 mg/dL (ref 70–99)
Potassium: 3 mmol/L — ABNORMAL LOW (ref 3.5–5.1)
Sodium: 142 mmol/L (ref 135–145)

## 2022-04-12 MED ORDER — NAPROXEN 500 MG PO TABS: 500.0000 mg | ORAL_TABLET | Freq: Two times a day (BID) | ORAL | 0 refills | Status: DC

## 2022-04-12 MED ORDER — POTASSIUM CHLORIDE CRYS ER 20 MEQ PO TBCR
40.0000 meq | EXTENDED_RELEASE_TABLET | Freq: Once | ORAL | Status: AC
  Administered 2022-04-12: 40 meq via ORAL
  Filled 2022-04-12: qty 2

## 2022-04-12 MED ORDER — SODIUM CHLORIDE (PF) 0.9 % IJ SOLN
INTRAMUSCULAR | Status: AC
  Filled 2022-04-12: qty 50

## 2022-04-12 MED ORDER — IOHEXOL 300 MG/ML  SOLN
75.0000 mL | Freq: Once | INTRAMUSCULAR | Status: AC | PRN
  Administered 2022-04-12: 75 mL via INTRAVENOUS

## 2022-04-12 MED ORDER — METHOCARBAMOL 500 MG PO TABS: 500.0000 mg | ORAL_TABLET | Freq: Every evening | ORAL | 0 refills | Status: AC

## 2022-04-12 MED ORDER — HYDROMORPHONE HCL 1 MG/ML IJ SOLN
0.5000 mg | Freq: Once | INTRAMUSCULAR | Status: AC
Start: 2022-04-12 — End: 2022-04-12
  Administered 2022-04-12: 0.5 mg via INTRAMUSCULAR
  Filled 2022-04-12: qty 1

## 2022-04-12 NOTE — ED Notes (Signed)
Pt ambulatory without assistance.  

## 2022-04-12 NOTE — ED Provider Notes (Signed)
5809: Care of patient assumed from [facility] PA-C.  Please see his note for further details.  Pt worked up for [employer].  Imaging overall negative with the exception of an acute right clavicular fracture.  CT chest w/ contrast pending to evaluate for further intrathoracic trauma.  If negative, can continue with follow up with orthopedics.  Hand fracture incidental finding from previous recent injury and unrelated to [facility] encounter today.  Pt placed in sling.  Physical Exam  BP 117/78 (BP Location: Left Arm)   Pulse 62   Temp 97.9 F (36.6 C) (Oral)   Resp 18   LMP 04/08/2022 Comment: negatie beta HCG 04/12/22  SpO2 100%   Physical Exam Vitals and nursing note reviewed.  Constitutional:      General: She is not in acute distress.    Appearance: She is well-developed. She is not ill-appearing or diaphoretic.  HENT:     Head: Normocephalic and atraumatic.  Eyes:     Conjunctiva/sclera: Conjunctivae normal.  Neck:     Comments: No trismus or torticollis observed.  Neck very supple. Cardiovascular:     Rate and Rhythm: Normal rate and regular rhythm.     Heart sounds: No murmur heard. Pulmonary:     Effort: Pulmonary effort is normal. No respiratory distress.     Breath sounds: Normal breath sounds.  Chest:     Chest wall: No tenderness.  Abdominal:     Palpations: Abdomen is soft.     Tenderness: There is no abdominal tenderness.  Musculoskeletal:        General: No swelling.     Cervical back: Neck supple. No rigidity or torticollis.     Comments: Stiffness and tenderness of the right shoulder, especially with range of motion.  Mild tenderness of the right hand, due to pre-existing injury.  Skin:    General: Skin is warm and dry.     Capillary Refill: Capillary refill takes less than 2 seconds.  Neurological:     Mental Status: She is alert and oriented to person, place, and time.  Psychiatric:        Mood and Affect: Mood normal.    Procedures  Procedures  ED Course /  MDM    Medical Decision Making Amount and/or Complexity of Data Reviewed External Data Reviewed: notes. Labs: ordered. Decision-making details documented in ED Course. Radiology: ordered and independent interpretation performed. Decision-making details documented in ED Course.    Details: R clavicular fracture identified via xrays.  Negative head/neck CTs and hip/pelvis xrays. ECG/medicine tests:  Decision-making details documented in ED Course.  Risk OTC drugs. Prescription drug management.   31 y.o. 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  Sleeping comfortably.  CT imaging of chest not indicative of right clavicular fracture that was previously interpreted via x-ray, and negative for any other acute abnormalities.  Not suspicious for significant emergency that requires further treatment or work-up.  Patient feels more comfortable with the right arm in the sling due to tenderness.  Believe patient would best benefit by following up with orthopedics in the next 1 to 2 weeks, and close follow-up in the next few days with her PCP.  Patient complaining of significant stiffness setting in.  Anti-inflammatories and muscle relaxants discussed at length and provided to pharmacy.  Reviewed results with patient at great length.  Patient in NAD and good condition at time of discharge.  After consideration of the diagnostic  results and the patient's encounter today, I feel that the emergency department workup does not suggest an emergent condition requiring admission or immediate intervention beyond what has been performed at this time.  The patient is safe for discharge and has been instructed to return immediately for worsening symptoms, change in symptoms or any other concerns.  Discussed course of treatment thoroughly with the patient, whom demonstrated understanding.  Patient in agreement and has no further questions.  I  discussed this case with my attending physician Dr. Doren Custard, who agreed with the proposed treatment course and cosigned this note including patient's presenting symptoms, physical exam, and planned diagnostics and interventions.  Attending physician stated agreement with plan or made changes to plan which were implemented.     This chart was dictated using voice recognition software.  Despite best efforts to proofread, errors can occur which can change the documentation meaning.     Prince Rome, PA-C [id number]    Godfrey Pick, MD 04/12/22 [phone]

## 2022-04-12 NOTE — ED Notes (Signed)
Pt transported to CT ?
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
## 2022-04-12 NOTE — ED Notes (Signed)
Pt at xray
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
## 2022-04-13 ENCOUNTER — Telehealth: Payer: Self-pay

## 2022-04-13 NOTE — Telephone Encounter (Signed)
Transition Care Management Follow-up Telephone Call Date of discharge and from where: 04/12/2022 from [facility] How have you been since you were released from the hospital? Patient stated that she is still sore but did not have any questions or concerns at this time.  Any questions or concerns? No  Items Reviewed: Did the pt receive and understand the discharge instructions provided? Yes  Medications obtained and verified? Yes  Other? No  Any new allergies since your discharge? No  Dietary orders reviewed? No Do you have support at home? Yes   Functional Questionnaire: (I = Independent and D = Dependent) ADLs: I  Bathing/Dressing- I  Meal Prep- I  Eating- I  Maintaining continence- I  Transferring/Ambulation- I  Managing Meds- I   Follow up appointments reviewed:  PCP Hospital f/u appt confirmed? No   Specialist Hospital f/u appt confirmed? No   Are transportation arrangements needed? No  If their condition worsens, is the pt aware to call PCP or go to the Emergency Dept.? Yes Was the patient provided with contact information for the PCP's office or ED? Yes Was to pt encouraged to call back with questions or concerns? Yes

## 2022-04-22 ENCOUNTER — Encounter: Payer: Self-pay | Admitting: Family Medicine

## 2022-04-22 ENCOUNTER — Other Ambulatory Visit: Payer: Self-pay

## 2022-04-22 ENCOUNTER — Ambulatory Visit (INDEPENDENT_AMBULATORY_CARE_PROVIDER_SITE_OTHER): Payer: Medicaid Other | Admitting: Family Medicine

## 2022-04-22 DIAGNOSIS — M25511 Pain in right shoulder: Secondary | ICD-10-CM

## 2022-04-22 HISTORY — DX: Pain in right shoulder: M25.511

## 2022-04-22 MED ORDER — OXYCODONE HCL 5 MG PO CAPS: 5.0000 mg | ORAL_CAPSULE | Freq: Three times a day (TID) | ORAL | 0 refills | Status: AC | PRN
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
## 2022-04-22 NOTE — Assessment & Plan Note (Signed)
ED note and images reviewed. Per her initial Xray report, there was a concern for scapular fracture which was not noted on her CT scan. I discussed repeat imaging vs referral to orthopedic for further evaluation and monitoring. She prefers referral. I started her on Oxycodone 5 mg for 5-7 days. Referral placed to ortho. ED precautions discussed.
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
## 2022-04-22 NOTE — Addendum Note (Signed)
Addended by: Andrena Mews T on: 04/22/2022 02:32 PM   Modules accepted: Orders

## 2022-04-26 ENCOUNTER — Ambulatory Visit (INDEPENDENT_AMBULATORY_CARE_PROVIDER_SITE_OTHER): Payer: Medicaid Other

## 2022-04-26 ENCOUNTER — Ambulatory Visit (INDEPENDENT_AMBULATORY_CARE_PROVIDER_SITE_OTHER): Payer: Medicaid Other | Admitting: Orthopaedic Surgery

## 2022-04-26 ENCOUNTER — Encounter: Payer: Self-pay | Admitting: *Deleted

## 2022-04-26 VITALS — BP 136/81 | HR 86 | Ht 63.0 in | Wt 97.0 lb

## 2022-04-26 DIAGNOSIS — M25511 Pain in right shoulder: Secondary | ICD-10-CM

## 2022-04-26 DIAGNOSIS — M542 Cervicalgia: Secondary | ICD-10-CM
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
## 2022-05-10 ENCOUNTER — Encounter (HOSPITAL_COMMUNITY): Payer: Self-pay | Admitting: Emergency Medicine

## 2022-05-10 ENCOUNTER — Ambulatory Visit (HOSPITAL_COMMUNITY)
Admission: EM | Admit: 2022-05-10 | Discharge: 2022-05-10 | Disposition: A | Discharge status: Home / Self Care | Payer: Medicaid Other | Attending: Emergency Medicine | Admitting: Emergency Medicine

## 2022-05-10 ENCOUNTER — Other Ambulatory Visit: Payer: Self-pay

## 2022-05-10 DIAGNOSIS — M25511 Pain in right shoulder: Secondary | ICD-10-CM | POA: Diagnosis not present

## 2022-05-10 DIAGNOSIS — M25512 Pain in left shoulder: Secondary | ICD-10-CM | POA: Diagnosis not present

## 2022-05-10 DIAGNOSIS — M542 Cervicalgia: Secondary | ICD-10-CM

## 2022-05-10 MED ORDER — PREDNISONE 20 MG PO TABS: 40.0000 mg | ORAL_TABLET | Freq: Every day | ORAL | 0 refills | Status: DC
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
## 2022-05-10 NOTE — Discharge Instructions (Signed)
Your pain is most likely caused by irritation to the muscles.  Stop use of meloxicam temporarily, begin use of prednisone every morning with food for the next 5 days  You may continue use of oxycodone and muscle relaxer as needed for additional comfort  You may continue use of sling as needed for support  You may use heating pad in 15 minute intervals as needed for additional comfort, or you may find comfort in using ice in 10-15 minutes over affected area  Begin stretching affected area daily for 10 minutes as tolerated to further loosen muscles   When lying /sitting down place pillow underneath arms and behind back and neck   Can try sleeping without pillow on firm mattress   Practice good posture: head back, shoulders back, chest forward, pelvis back and weight distributed evenly on both legs  If you pain continues to persist or worsen please follow-up with your orthopedic specialist for further evaluation and management

## 2022-07-18 ENCOUNTER — Ambulatory Visit: Payer: Medicaid Other | Admitting: Family Medicine

## 2022-07-26 ENCOUNTER — Encounter: Payer: Self-pay | Admitting: Family Medicine

## 2022-07-26 ENCOUNTER — Ambulatory Visit (INDEPENDENT_AMBULATORY_CARE_PROVIDER_SITE_OTHER): Payer: Medicaid Other | Admitting: Family Medicine

## 2022-07-26 VITALS — BP 119/84 | HR 99 | Ht 63.0 in | Wt 102.1 lb

## 2022-07-26 DIAGNOSIS — E0590 Thyrotoxicosis, unspecified without thyrotoxic crisis or storm: Secondary | ICD-10-CM

## 2022-07-26 DIAGNOSIS — Z23 Encounter for immunization: Secondary | ICD-10-CM | POA: Diagnosis not present

## 2022-07-26 DIAGNOSIS — Z309 Encounter for contraceptive management, unspecified: Secondary | ICD-10-CM | POA: Diagnosis not present

## 2022-07-26 DIAGNOSIS — F3170 Bipolar disorder, currently in remission, most recent episode unspecified: Secondary | ICD-10-CM | POA: Diagnosis not present

## 2022-07-26 LAB — POCT URINE PREGNANCY: Preg Test, Ur: NEGATIVE

## 2022-07-26 MED ORDER — MEDROXYPROGESTERONE ACETATE 150 MG/ML IM SUSP
150.0000 mg | Freq: Once | INTRAMUSCULAR | Status: AC
  Administered 2022-07-26: 150 mg via INTRAMUSCULAR

## 2022-07-26 NOTE — Progress Notes (Signed)
    SUBJECTIVE:   CHIEF COMPLAINT / HPI:   Contraceptive management:  She is here for depo shot. She had been on this in the past. Her LMP: 8/27, regular period of 5 days duration. No belly pain or vaginal discharge.  Bipolar: She wishes to reestablish care with Psych.  Thyroid dysfunction: Weight goes up and down. She denies palpitations or tremors or temperature intolerance.  PERTINENT  PMH / PSH: PMHx reviewed.  OBJECTIVE:   Vitals:   07/26/22 0951  BP: 119/84  Pulse: 99  SpO2: 100%  Weight: 102 lb 2 oz (46.3 kg)  Height: '5\' 3"'$  (1.6 m)    Physical Exam Vitals and nursing note reviewed.  Neck:     Comments: No thyromegaly Cardiovascular:     Rate and Rhythm: Normal rate and regular rhythm.     Heart sounds: No murmur heard. Pulmonary:     Effort: Pulmonary effort is normal. No respiratory distress.     Breath sounds: Normal breath sounds. No rhonchi.  Abdominal:     General: Abdomen is flat. Bowel sounds are normal. There is no distension.     Palpations: Abdomen is soft. There is no mass.     Tenderness: There is no abdominal tenderness.      ASSESSMENT/PLAN:   Bipolar disorder Currently not a danger to self or others. List of Psychiatrist provided for her to make her appointment. She agreed with the plan.  Hyperthyroidism TSH check offered. She will return for this in the future due to insurance reason.   Contraception management Counseling provided including medication s/e. Upreg negative Use back up for 7 days advised. F/U in 3 months for re-injection. She agreed with the plan.      Andrena Mews, MD [place]

## 2022-07-26 NOTE — Addendum Note (Signed)
Addended by: Salvatore Marvel on: 07/26/2022 10:15 AM   Modules accepted: Orders

## 2022-07-26 NOTE — Addendum Note (Signed)
Addended by: Salvatore Marvel on: 07/26/2022 10:11 AM   Modules accepted: Orders

## 2022-07-26 NOTE — Assessment & Plan Note (Signed)
Counseling provided including medication s/e. Upreg negative Use back up for 7 days advised. F/U in 3 months for re-injection. She agreed with the plan.

## 2022-07-26 NOTE — Assessment & Plan Note (Signed)
Currently not a danger to self or others. List of Psychiatrist provided for her to make her appointment. She agreed with the plan.

## 2022-07-26 NOTE — Patient Instructions (Addendum)
It was nice seeing you today. We have given you your depo shot for birth control. Please use condoms for the next 7 days to allow the shot to be effective.  See list below to schedule Psychiatry appointment for bipolar. Psychiatry Resource List (Adults and Children) Most of these providers will take Medicaid. please consult your insurance for a complete and updated list of available providers. When calling to make an appointment have your insurance information available to confirm you are covered.   [facility]:Psychiatry and Counseling [address] [facility] [place]. [place], [place] [address] [phone]  [facility]  [place], [place]:   [facility]: [address]     [phone]   Linna Hoff: [place]. [place],        [phone] [place]: [place],    [place]: [phone] [address],                   [phone] [place]: [place] [address]         [phone]  [place] (virtual only) [phone]   [facility]  (Psychiatry only; Adults /children 12 and over, will take Medicaid)  [place], [place], [place] [address]       [phone]   [facility] (Psychiatry & counseling ; adults & children ; will take Medicaid [address]  [address]   [place] [address]  Go on-line to complete referral ( [email or web address] [phone]    (Spanish speaking therapists)  [facility]  Psychiatry & counseling; Adults and children;  Call Registration prior to scheduling an appointment [phone] [place]    [place], [place] [address]    [phone]  [facility] (Psychiatry & counseling; adults & children; Medicare no Medicaid)  [place], [place]  [address]      [phone]    [facility] (up to age 37)  Psychiatry & counseling ,will take Medicaid, must do counseling to receive psychiatry services  [address] [place] [address]        ([place] (Psychiatry & counseling; adults & children; will take Medicaid) Will need a referral from provider [address],  [place], [place]  [phone]   [facility] --- Walk-In Mon-Friday 8am-3pm ( will take Medicaid, Psychiatry, Adults & children,  [address] [place], [place]   [phone]   [facility]--, Walk-in M-F 8am-12pm and 1pm -3pm   (Counseling, Psychiatry, will take Medicaid, adults & children)  [address] [place], [place]  [phone]

## 2022-07-26 NOTE — Assessment & Plan Note (Signed)
TSH check offered. She will return for this in the future due to insurance reason.

## 2022-08-04 ENCOUNTER — Other Ambulatory Visit: Payer: Self-pay | Admitting: *Deleted

## 2022-08-04 NOTE — Patient Instructions (Signed)
Visit Information  Kaitlyn Hunt was given information about Medicaid Managed Care team care coordination services as a part of their [place] Medicaid benefit. Kaitlyn Hunt verbally consented to engagement with the [facility] Managed Care team.   If you are experiencing a medical emergency, please call 911 or report to your local emergency department or urgent care.   If you have a non-emergency medical problem during routine business hours, please contact your provider's office and ask to speak with a nurse.   For questions related to your [facility], please call: [phone] or visit the homepage here: [email or web address]  If you would like to schedule transportation through your [facility], please call the following number at least 2 days in advance of your appointment: [phone]   Rides for urgent appointments can also be made after hours by calling Member Services.  Call the [place] at [phone], at any time, 24 hours a day, 7 days a week. If you are in danger or need immediate medical attention call 911.  If you would like help to quit smoking, call 1-800-QUIT-NOW 607-627-9048) OR Espaol: 1-855-Djelo-Ya (7-858-850-2774) o para ms informacin haga clic aqu or Text READY to 200-400 to register via text  Kaitlyn Hunt,   Please see education materials related to hyperthyroid provided by MyChart link.  Patient verbalizes understanding of instructions and care plan provided today and agrees to view in [place]. Active MyChart status and patient understanding of how to access instructions and care plan via MyChart confirmed with patient.     Telephone follow up appointment with Managed Medicaid care management team member scheduled for:09/07/22 @ [place] RN, [place] RN Care  Coordinator   Following is a copy of your plan of care:  Care Plan : RN Care Manager Plan of Care  Updates made by Kaitlyn Montane, RN since 08/04/2022 12:00 AM     Problem: Health Management needs related to Hyperthyroid      Long-Range Goal: Development of Plan of Care To address Health Management needs related to Hyperthyroid   Start Date: 08/04/2022  Expected End Date: 11/02/2022  Priority: High  Note:   Current Barriers:  Care Coordination needs related to Limited access to food and Housing barriers  Chronic Disease Management support and education needs related to Thyroid Disorder  RNCM Clinical Goal(s):  Patient will verbalize understanding of plan for management of thyroid disorder as evidenced by patient reports work with Education officer, museum to address Limited access to food and Housing barriers related to the management of thyroid disorder as evidenced by review of EMR and patient or Education officer, museum report     work with LCSW to establish care for Bipolar as evidenced by EMR documentation  through collaboration with Consulting civil engineer, provider, and care team.   Interventions: Inter-disciplinary care team collaboration (see longitudinal plan of care) Evaluation of current treatment plan related to  self management and patient's adherence to plan as established by provider   Hyperthyroid  (Status: New goal.) Long Term Goal  Evaluation of current treatment plan related to  thyroid disorder ,  self-management and patient's adherence to plan as established by provider. Discussed plans with patient for ongoing care management follow up and provided patient with direct contact information for care management team Advised patient to call PCP office to schedule lab visit; Provided education to patient re: hyperthyroid; Social Work referral for assistance  with housing and food pantries, telephone appointment scheduled for 08/10/22 at 9am ; Assessed social determinant of health barriers;  LCSW  referral for assistance with establishing care for Bipolar, telephone appointment scheduled for 08/18/22 st 9am Reviewed recent PCP note  Patient Goals/Self-Care Activities: Attend all scheduled provider appointments Call provider office for new concerns or questions  Work with the social worker to address care coordination needs and will continue to work with the clinical team to address health care and disease management related needs Work with LCSW to establish care for Bipolar

## 2022-08-04 NOTE — Patient Outreach (Signed)
Medicaid Managed Care   Nurse Care Manager Note  02/27/8118 Name:  Kaitlyn Hunt MRN:  [id number] DOB:  [birth year]  Kaitlyn Hunt is an 31 y.o. year old female who is a primary patient of [name], MD.  The [facility] Managed Care Coordination team was consulted for assistance with:    Thyroid disorder  Ms. Littleton was given information about Medicaid Managed Care Coordination team services today. Kaitlyn Hunt Patient agreed to services and verbal consent obtained.  Engaged with patient by telephone for initial visit in response to provider referral for case management and/or care coordination services.   Assessments/Interventions:  Review of past medical history, allergies, medications, health status, including review of consultants reports, laboratory and other test data, was performed as part of comprehensive evaluation and provision of chronic care management services.  SDOH (Social Determinants of Health) assessments and interventions performed: SDOH Interventions    Flowsheet Row Patient Outreach Telephone from 08/04/2022 in [place] Coordination Office Visit from 05/12/2020 in [place] Office Visit from 07/25/2017 in [place] Interventions     Housing Interventions Other (Comment)  Merilyn Baba to BSW] -- --  Transportation Interventions Intervention Not Indicated -- --  Depression Interventions/Treatment  -- Counseling Referral to Psychiatry       Care Plan  No Known Allergies  Medications Reviewed Today     Reviewed by Melissa Montane, RN (Registered Nurse) on 08/04/22 at [place] List Status: <None>   Medication Order Taking? Sig Documenting Provider Last Dose Status Informant  diphenhydrAMINE (BENADRYL ALLERGY) 25 mg capsule [id number] No Take 1 capsule (25 mg total) by mouth every 8 (eight) hours as needed for itching.  Patient not taking: Reported on 04/22/2022    [name], MD Not Taking Active   halobetasol (ULTRAVATE) 0.05 % cream [id number] No Apply topically 2 (two) times daily. Apply to rash on the skin  Patient not taking: Reported on 07/26/2022   [name], MD Not Taking Active   naproxen (NAPROSYN) 500 MG tablet [id number] No Take 1 tablet (500 mg total) by mouth 2 (two) times daily.  Patient not taking: Reported on 4/0/3474   Prince Rome, PA-C Not Taking Active   predniSONE (DELTASONE) 20 MG tablet [id number] No Take 2 tablets (40 mg total) by mouth daily.  Patient not taking: Reported on 07/26/2022   Hans Eden, NP Not Taking Active             Patient Active Problem List   Diagnosis Date Noted   Right shoulder pain 04/22/2022   Boxer's metacarpal fracture, neck, closed 08/17/2021   Hyperthyroidism 12/14/2018   Bipolar disorder ([place]) 10/09/2013   Contraception management 02/25/2013   GOITER, UNSPECIFIED 10/31/2007    Conditions to be addressed/monitored per PCP order:   Thyroid Disorder  Care Plan : [place] of Care  Updates made by Melissa Montane, RN since 08/04/2022 12:00 AM     Problem: Health Management needs related to Hyperthyroid      Long-Range Goal: Development of Plan of Care To address Health Management needs related to Hyperthyroid   Start Date: 08/04/2022  Expected End Date: 11/02/2022  Priority: High  Note:   Current Barriers:  Care Coordination needs related to Limited access to food and Housing barriers  Chronic Disease Management support and education needs related to Thyroid Disorder  RNCM Clinical Goal(s):  Patient will verbalize understanding  of plan for management of thyroid disorder as evidenced by patient reports work with Education officer, museum to address Limited access to food and Housing barriers related to the management of thyroid disorder as evidenced by review of EMR and patient or Education officer, museum report     work with LCSW to establish care for Bipolar as evidenced  by EMR documentation  through collaboration with Consulting civil engineer, provider, and care team.   Interventions: Inter-disciplinary care team collaboration (see longitudinal plan of care) Evaluation of current treatment plan related to  self management and patient's adherence to plan as established by provider   Hyperthyroid  (Status: New goal.) Long Term Goal  Evaluation of current treatment plan related to  thyroid disorder ,  self-management and patient's adherence to plan as established by provider. Discussed plans with patient for ongoing care management follow up and provided patient with direct contact information for care management team Advised patient to call PCP office to schedule lab visit; Provided education to patient re: hyperthyroid; Social Work referral for assistance with housing and food pantries, telephone appointment scheduled for 08/10/22 at 9am ; Assessed social determinant of health barriers;  LCSW referral for assistance with establishing care for Bipolar, telephone appointment scheduled for 08/18/22 st 9am Reviewed recent PCP note  Patient Goals/Self-Care Activities: Attend all scheduled provider appointments Call provider office for new concerns or questions  Work with the social worker to address care coordination needs and will continue to work with the clinical team to address health care and disease management related needs Work with LCSW to establish care for Bipolar       Follow Up:  Patient agrees to Care Plan and Follow-up.  Plan: The Managed Medicaid care management team will reach out to the patient again over the next 30 days.  Date/time of next scheduled RN care management/care coordination outreach:  09/07/22 @ [place] RN, BSN [place]  [employer] manager

## 2022-08-10 ENCOUNTER — Other Ambulatory Visit: Payer: Self-pay

## 2022-08-10 NOTE — Patient Outreach (Signed)
Medicaid Managed Care Social Work Note  8/65/7846 Name:  Kaitlyn Hunt MRN:  [id number] DOB:  [birth year]  Kaitlyn Hunt is an 31 y.o. year old female who is a primary patient of [name], MD.  The Medicaid Managed Care Coordination team was consulted for assistance with:  Food Insecurity Community Resources   Ms. Scherzinger was given information about Medicaid Managed Care Coordination team services today. Kaitlyn Hunt Patient agreed to services and verbal consent obtained.  Engaged with patient  for by telephone forinitial visit in response to referral for case management and/or care coordination services.   Assessments/Interventions:  Review of past medical history, allergies, medications, health status, including review of consultants reports, laboratory and other test data, was performed as part of comprehensive evaluation and provision of chronic care management services.  SDOH: (Social Determinant of Health) assessments and interventions performed: SDOH Interventions    Flowsheet Row Patient Outreach Telephone from 08/04/2022 in [place] Coordination Office Visit from 05/12/2020 in [place] Office Visit from 07/25/2017 in [place]  SDOH Interventions     Housing Interventions Other (Comment)  [Referral to BSW] -- --  Transportation Interventions Intervention Not Indicated -- --  Depression Interventions/Treatment  -- Counseling Referral to Psychiatry      08/10/22: BSW completed a telephone outreach with patient. She is currently staying with her mom and helping her with her rent. Patient states she just started working and does not know what her monthly income will me. BSW will send patient a list of housing resources to try. Patient stated she does receives foodstamps but only getting $60 per month she has contacted DSS but has not received a telephone call back yet. BSW will send  patient a list of food pantries in the area. No other resources are needed at this time. Advanced Directives Status:  Not addressed in this encounter.  Care Plan                 No Known Allergies  Medications Reviewed Today     Reviewed by Melissa Montane, RN (Registered Nurse) on 08/04/22 at 40  Med List Status: <None>   Medication Order Taking? Sig Documenting Provider Last Dose Status Informant  diphenhydrAMINE (BENADRYL ALLERGY) 25 mg capsule [id number] No Take 1 capsule (25 mg total) by mouth every 8 (eight) hours as needed for itching.  Patient not taking: Reported on 04/22/2022   [name], MD Not Taking Active   halobetasol (ULTRAVATE) 0.05 % cream [id number] No Apply topically 2 (two) times daily. Apply to rash on the skin  Patient not taking: Reported on 07/26/2022   [name], MD Not Taking Active   naproxen (NAPROSYN) 500 MG tablet [id number] No Take 1 tablet (500 mg total) by mouth 2 (two) times daily.  Patient not taking: Reported on 12/30/5186   Prince Rome, PA-C Not Taking Active   predniSONE (DELTASONE) 20 MG tablet [id number] No Take 2 tablets (40 mg total) by mouth daily.  Patient not taking: Reported on 07/26/2022   Hans Eden, NP Not Taking Active             Patient Active Problem List   Diagnosis Date Noted   Right shoulder pain 04/22/2022   Boxer's metacarpal fracture, neck, closed 08/17/2021   Hyperthyroidism 12/14/2018   Bipolar disorder ([place]) 10/09/2013   Contraception management 02/25/2013   GOITER, UNSPECIFIED 10/31/2007    Conditions to  be addressed/monitored per PCP order:   community resources  Care Plan : [place] of Care  Updates made by Ethelda Chick since 08/10/2022 12:00 AM     Problem: Health Management needs related to Hyperthyroid      Long-Range Goal: Development of Plan of Care To address Health Management needs related to Hyperthyroid   Start Date: 08/04/2022  Expected End Date:  11/02/2022  Priority: High  Note:   Current Barriers:  Care Coordination needs related to Limited access to food and Housing barriers  Chronic Disease Management support and education needs related to Thyroid Disorder  RNCM Clinical Goal(s):  Patient will verbalize understanding of plan for management of thyroid disorder as evidenced by patient reports work with Education officer, museum to address Limited access to food and Housing barriers related to the management of thyroid disorder as evidenced by review of EMR and patient or Education officer, museum report     work with LCSW to establish care for Bipolar as evidenced by EMR documentation  through collaboration with Consulting civil engineer, provider, and care team.   Interventions: Inter-disciplinary care team collaboration (see longitudinal plan of care) Evaluation of current treatment plan related to  self management and patient's adherence to plan as established by provider 08/10/22: BSW completed a telephone outreach with patient. She is currently staying with her mom and helping her with her rent. Patient states she just started working and does not know what her monthly income will me. BSW will send patient a list of housing resources to try. Patient stated she does receives foodstamps but only getting $60 per month she has contacted DSS but has not received a telephone call back yet. BSW will send patient a list of food pantries in the area. No other resources are needed at this time.   Hyperthyroid  (Status: New goal.) Long Term Goal  Evaluation of current treatment plan related to  thyroid disorder ,  self-management and patient's adherence to plan as established by provider. Discussed plans with patient for ongoing care management follow up and provided patient with direct contact information for care management team Advised patient to call PCP office to schedule lab visit; Provided education to patient re: hyperthyroid; Social Work referral for assistance  with housing and food pantries, telephone appointment scheduled for 08/10/22 at 9am ; Assessed social determinant of health barriers;  LCSW referral for assistance with establishing care for Bipolar, telephone appointment scheduled for 08/18/22 st 9am Reviewed recent PCP note  Patient Goals/Self-Care Activities: Attend all scheduled provider appointments Call provider office for new concerns or questions  Work with the social worker to address care coordination needs and will continue to work with the clinical team to address health care and disease management related needs Work with LCSW to establish care for Bipolar       Follow up:  Patient agrees to Care Plan and Follow-up.  Plan: The Managed Medicaid care management team will reach out to the patient again over the next 30 days.  Date/time of next scheduled Social Work care management/care coordination outreach:  09/09/22  Mickel Fuchs, Arita Miss, [place] Managed Medicaid Team  [phone]

## 2022-08-10 NOTE — Patient Instructions (Signed)
Visit Information  Kaitlyn Hunt was given information about Medicaid Managed Care team care coordination services as a part of their [place] Medicaid benefit. [name] verbally consented to engagement with the [facility] Managed Care team.   If you are experiencing a medical emergency, please call 911 or report to your local emergency department or urgent care.   If you have a non-emergency medical problem during routine business hours, please contact your provider's office and ask to speak with a nurse.   For questions related to your [facility], please call: [phone] or visit the homepage here: [email or web address]  If you would like to schedule transportation through your [facility], please call the following number at least 2 days in advance of your appointment: [phone]   Rides for urgent appointments can also be made after hours by calling Member Services.  Call the [place] at [phone], at any time, 24 hours a day, 7 days a week. If you are in danger or need immediate medical attention call 911.  If you would like help to quit smoking, call 1-800-QUIT-NOW 805-635-8083) OR Espaol: 1-855-Djelo-Ya (6-378-588-5027) o para ms informacin haga clic aqu or Text READY to 200-400 to register via text  Ms. Bureau - following are the goals we discussed in your visit today:   Goals Addressed   None      Social Worker will follow up in 30 days .   Mickel Fuchs, BSW, [place]  High Risk Managed Medicaid Team  [phone]   Following is a copy of your plan of care:  Care Plan : [place] of Care  Updates made by Ethelda Chick since 08/10/2022 12:00 AM     Problem: Health Management needs related to Hyperthyroid      Long-Range Goal: Development of Plan of  Care To address Health Management needs related to Hyperthyroid   Start Date: 08/04/2022  Expected End Date: 11/02/2022  Priority: High  Note:   Current Barriers:  Care Coordination needs related to Limited access to food and Housing barriers  Chronic Disease Management support and education needs related to Thyroid Disorder  RNCM Clinical Goal(s):  Patient will verbalize understanding of plan for management of thyroid disorder as evidenced by patient reports work with Education officer, museum to address Limited access to food and Housing barriers related to the management of thyroid disorder as evidenced by review of EMR and patient or Education officer, museum report     work with LCSW to establish care for Bipolar as evidenced by EMR documentation  through collaboration with Consulting civil engineer, provider, and care team.   Interventions: Inter-disciplinary care team collaboration (see longitudinal plan of care) Evaluation of current treatment plan related to  self management and patient's adherence to plan as established by provider 08/10/22: BSW completed a telephone outreach with patient. She is currently staying with her mom and helping her with her rent. Patient states she just started working and does not know what her monthly income will me. BSW will send patient a list of housing resources to try. Patient stated she does receives foodstamps but only getting $60 per month she has contacted DSS but has not received a telephone call back yet. BSW will send patient a list of food pantries in the area. No other resources are needed at this time.   Hyperthyroid  (Status: New goal.) Long Term Goal  Evaluation  of current treatment plan related to  thyroid disorder ,  self-management and patient's adherence to plan as established by provider. Discussed plans with patient for ongoing care management follow up and provided patient with direct contact information for care management team Advised patient to call PCP office to  schedule lab visit; Provided education to patient re: hyperthyroid; Social Work referral for assistance with housing and food pantries, telephone appointment scheduled for 08/10/22 at 9am ; Assessed social determinant of health barriers;  LCSW referral for assistance with establishing care for Bipolar, telephone appointment scheduled for 08/18/22 st 9am Reviewed recent PCP note  Patient Goals/Self-Care Activities: Attend all scheduled provider appointments Call provider office for new concerns or questions  Work with the social worker to address care coordination needs and will continue to work with the clinical team to address health care and disease management related needs Work with LCSW to establish care for Bipolar

## 2022-08-18 ENCOUNTER — Other Ambulatory Visit: Payer: Self-pay | Admitting: Licensed Clinical Social Worker

## 2022-08-18 DIAGNOSIS — F3170 Bipolar disorder, currently in remission, most recent episode unspecified: Secondary | ICD-10-CM

## 2022-08-18 NOTE — Patient Outreach (Signed)
Medicaid Managed Care Social Work Note  3/79/0240 Name:  Kaitlyn Hunt MRN:  [id number] DOB:  [birth year]  Kaitlyn Hunt is an 31 y.o. year old female who is a primary patient of [name], MD.  The Medicaid Managed Care Coordination team was consulted for assistance with:  [place] and Resources  [name] was given information about Medicaid Managed Care Coordination team services today. Kaitlyn Hunt Patient agreed to services and verbal consent obtained.  Engaged with patient  for by telephone forinitial visit in response to referral for case management and/or care coordination services.   Assessments/Interventions:  Review of past medical history, allergies, medications, health status, including review of consultants reports, laboratory and other test data, was performed as part of comprehensive evaluation and provision of chronic care management services.  SDOH: (Social Determinant of Health) assessments and interventions performed: SDOH Interventions    Flowsheet Row Patient Outreach Telephone from 08/18/2022 in [place] Patient Outreach Telephone from 08/04/2022 in [place] Coordination Office Visit from 05/12/2020 in [place] Office Visit from 07/25/2017 in [place]  SDOH Interventions      Housing Interventions -- Other (Comment)  [Referral to BSW] -- --  Transportation Interventions -- Intervention Not Indicated -- --  Depression Interventions/Treatment  Referral to Psychiatry -- Counseling Referral to Psychiatry  Stress Interventions Offered [employer], Provide Counseling -- -- --       Advanced Directives Status:  See Care Plan for related entries.  Care Plan                 No Known Allergies  Medications Reviewed Today     Reviewed by Greg Cutter, LCSW (Social Worker) on 08/18/22 at [phone]   Med List Status: <None>   Medication Order Taking? Sig Documenting Provider Last Dose Status Informant  diphenhydrAMINE (BENADRYL ALLERGY) 25 mg capsule [id number] No Take 1 capsule (25 mg total) by mouth every 8 (eight) hours as needed for itching.  Patient not taking: Reported on 04/22/2022   [name], MD Not Taking Active   halobetasol (ULTRAVATE) 0.05 % cream [id number] No Apply topically 2 (two) times daily. Apply to rash on the skin  Patient not taking: Reported on 07/26/2022   [name], MD Not Taking Active   naproxen (NAPROSYN) 500 MG tablet [id number] No Take 1 tablet (500 mg total) by mouth 2 (two) times daily.  Patient not taking: Reported on 07/28/262   Prince Rome, PA-C Not Taking Active   predniSONE (DELTASONE) 20 MG tablet [id number] No Take 2 tablets (40 mg total) by mouth daily.  Patient not taking: Reported on 07/26/2022   Hans Eden, NP Not Taking Active             Patient Active Problem List   Diagnosis Date Noted   Right shoulder pain 04/22/2022   Boxer's metacarpal fracture, neck, closed 08/17/2021   Hyperthyroidism 12/14/2018   Bipolar disorder ([place]) 10/09/2013   Contraception management 02/25/2013   GOITER, UNSPECIFIED 10/31/2007    Conditions to be addressed/monitored per PCP order:  Bipolar Disorder  Care Plan : LCSW Plan of Care  Updates made by Greg Cutter, LCSW since 08/18/2022 12:00 AM     Problem: Coping Skills (General Plan of Care)      Goal: Coping Skills Enhanced   Start Date: 08/18/2022  Priority: High  Note:   Priority:  High  Timeframe:  Long-Range Goal Priority:  High Start Date:   08/18/22     Expected End Date:  ongoing                     Follow Up Date--09/01/22 at 1030 am  - keep 90 percent of scheduled appointments -consider counseling in addition to psychiatry  -consider bumping up your self-care  -consider creating a stronger support network   Why is this important?             Beating  stress may take some time.            If you don't feel better right away, don't give up on your treatment plan.    Current barriers:   Chronic Mental Health needs related to bipolar and stress. Patient requires Support, Education, Resources, Referrals, Advocacy, and Care Coordination, in order to meet Unmet Mental Health Needs. Patient will implement clinical interventions discussed today to decrease symptoms of bipolar and increase knowledge and/or ability of: coping skills. Mental Health Concerns and Social Isolation Patient lacks knowledge of available community counseling agencies and resources.  Clinical Goal(s): verbalize understanding of plan for management of Bi polar and Stress and demonstrate a reduction in symptoms. Patient will connect with a provider for ongoing mental health treatment, increase coping skills, healthy habits, self-management skills, and stress reduction        Clinical Interventions:  Assessed patient's previous and current treatment, coping skills, support system and barriers to care. Patient provided hx  Verbalization of feelings encouraged, motivational interviewing employed Emotional support provided, positive coping strategies explored. Establishing healthy boundaries emphasized and healthy self-care education provided Patient was educated on available mental health resources within their area that accept Medicaid and offer counseling and psychiatry. Patient reports only wanting psychiatry at this time. Patient does not wish to gain a referral for counseling. Patient reports being in need of medication to help manage her bi polar symptoms.  Patient denies any transportation issues and reports being able to go to [facility] for her initial appointment for psychiatry.  Patient is agreeable to referral to [facility] for psychiatry. [facility] LCSW made referral on 08/18/22.  Patient reports that her stress has increase recently and that she is wanting to help decrease this with  medication therapy.  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 LCSW provided education on relaxation techniques such as meditation, deep breathing, massage, grounding exercises or yoga that can activate the body's relaxation response and ease symptoms of stress and anxiety. LCSW ask that when pt is struggling with difficult emotions and racing thoughts that they start this relaxation response process. LCSW provided extensive education on healthy coping skills for anxiety. SW used active and reflective listening, validated patient's feelings/concerns, and provided emotional support. Patient will work on implementing appropriate self-care habits into their daily routine such as: staying positive, writing a gratitude list, drinking water, staying active around the house, taking their medications as prescribed, combating negative thoughts or emotions and staying connected with their family and friends. Positive reinforcement provided for this decision to work on this.  Motivational Interviewing employed Depression screen reviewed  PHQ2/ PHQ9 completed or reviewed  Mindfulness or Relaxation training provided Active listening / Reflection utilized  Advance Care and HCPOA education provided Emotional Support Provided Problem [place] strategies reviewed Provided psychoeducation for mental health needs  Provided brief CBT  Reviewed mental health medications and discussed  importance of compliance:  Quality of sleep assessed & Sleep Hygiene techniques promoted  Participation in counseling encouraged  Verbalization of feelings encouraged  Suicidal Ideation/Homicidal Ideation assessed: Patient denies SI/HI  Review resources, discussed options and provided patient information about  [place] care team collaboration (see longitudinal plan of care) Patient Goals/Self-Care Activities: Over the next 120 days Attend  scheduled medical appointments Utilize healthy coping skills and supportive resources discussed Contact PCP with any questions or concerns Keep 90 percent of counseling appointments Call your insurance provider for more information about your Enhanced Benefits  Check out counseling resources provided  Begin personal counseling with LCSW, to reduce and manage symptoms of Depression and Stress, until well-established with mental health provider Accept all calls from representative with [facility] in an effort to establish ongoing mental health counseling and supportive services. Incorporate into daily practice - relaxation techniques, deep breathing exercises, and mindfulness meditation strategies. Talk about feelings with friends, family members, spiritual advisor, etc. Contact LCSW directly [phone]), if you have questions, need assistance, or if additional social work needs are identified between now and our next scheduled telephone outreach call. Call 988 for mental health hotline/crisis line if needed (24/7 available) Try techniques to reduce symptoms of anxiety/negative thinking (deep breathing, distraction, positive self talk, etc)  - develop a personal safety plan - develop a plan to deal with triggers like holidays, anniversaries - exercise at least 2 to 3 times per week - have a plan for how to handle bad days - journal feelings and what helps to feel better or worse - spend time or talk with others at least 2 to 3 times per week - watch for early signs of feeling worse - begin personal counseling - call and visit an old friend - check out volunteer opportunities - join a support group - laugh; watch a funny movie or comedian - learn and use visualization or guided imagery - perform a random act of kindness - practice relaxation or meditation daily - start or continue a personal journal - practice positive thinking and self-talk -continue with compliance of taking medication   -identify current effective and ineffective coping strategies.  -implement positive self-talk in care to increase self-esteem, confidence and feelings of control.  -consider alternative and complementary therapy approaches such as meditation, mindfulness or yoga.  -journaling, prayer, worship services, meditation or pastoral counseling.  -increase participation in pleasurable group activities such as hobbies, singing, sports or volunteering).  -consider the use of meditative movement therapy such as tai chi, yoga or qigong.  -start a regular daily exercise program based on tolerance, ability and patient choice to support positive thinking and activity    The following coping skill education was provided for stress relief and mental health management: "When your car dies or a deadline looms, how do you respond? Long-term, low-grade or acute stress takes a serious toll on your body and mind, so don't ignore feelings of constant tension. Stress is a natural part of life. However, too much stress can harm our health, especially if it continues every day. This is chronic stress and can put you at risk for heart problems like heart disease and depression. Understand what's happening inside your body and learn simple coping skills to combat the negative impacts of everyday stressors.  Types of Stress There are two types of stress: Emotional - types of emotional stress are relationship problems, pressure at work, financial worries, experiencing discrimination or having a major life change. Physical - Examples  of physical stress include being sick having pain, not sleeping well, recovery from an injury or having an alcohol and drug use disorder. Fight or Flight Sudden or ongoing stress activates your nervous system and floods your bloodstream with adrenaline and cortisol, two hormones that raise blood pressure, increase heart rate and spike blood sugar. These changes pitch your body into a fight or flight  response. That enabled our ancestors to outrun saber-toothed tigers, and it's helpful today for situations like dodging a car accident. But most modern chronic stressors, such as finances or a challenging relationship, keep your body in that heightened state, which hurts your health. Effects of Too Much Stress If constantly under stress, most of [place] will eventually start to function less well.  Multiple studies link chronic stress to a higher risk of heart disease, stroke, depression, weight gain, memory loss and even premature death, so it's important to recognize the warning signals. Talk to your doctor about ways to manage stress if you're experiencing any of these symptoms: Prolonged periods of poor sleep. Regular, severe headaches. Unexplained weight loss or gain. Feelings of isolation, withdrawal or worthlessness. Constant anger and irritability. Loss of interest in activities. Constant worrying or obsessive thinking. Excessive alcohol or drug use. Inability to concentrate.  10 Ways to Cope with Chronic Stress It's key to recognize stressful situations as they occur because it allows you to focus on managing how you react. We all need to know when to close our eyes and take a deep breath when we feel tension rising. Use these tips to prevent or reduce chronic stress. 1. Rebalance Work and Home All work and no play? If you're spending too much time at the office, intentionally put more dates in your calendar to enjoy time for fun, either alone or with others. 2. Get Regular Exercise Moving your body on a regular basis balances the nervous system and increases blood circulation, helping to flush out stress hormones. Even a daily 20-minute walk makes a difference. Any kind of exercise can lower stress and improve your mood ? just pick activities that you enjoy and make it a regular habit. 3. Eat Well and Limit Alcohol and Stimulants Alcohol, nicotine and caffeine may temporarily relieve  stress but have negative health impacts and can make stress worse in the long run. Well-nourished bodies cope better, so start with a good breakfast, add more organic fruits and vegetables for a well-balanced diet, avoid processed foods and sugar, try herbal tea and drink more water. 4. Connect with Supportive People Talking face to face with another person releases hormones that reduce stress. Lean on those good listeners in your life. 5. [place] Time Do you enjoy gardening, reading, listening to music or some other creative pursuit? Engage in activities that bring you pleasure and joy; research shows that reduces stress by almost half and lowers your heart rate, too. 6. Practice Meditation, Stress Reduction or Yoga Relaxation techniques activate a state of restfulness that counterbalances your body's fight-or-flight hormones. Even if this also means a 10-minute break in a long day: listen to music, read, go for a walk in nature, do a hobby, take a bath or spend time with a friend. Also consider doing a mindfulness exercise or try a daily deep breathing or imagery practice. Deep Breathing Slow, calm and deep breathing can help you relax. Try these steps to focus on your breathing and repeat as needed. Find a comfortable position and close your eyes. Exhale and drop your shoulders.  Breathe in through your nose; fill your lungs and then your belly. Think of relaxing your body, quieting your mind and becoming calm and peaceful. Breathe out slowly through your nose, relaxing your belly. Think of releasing tension, pain, worries or distress. Repeat steps three and four until you feel relaxed. Imagery This involves using your mind to excite the senses -- sound, vision, smell, taste and feeling. This may help ease your stress. Begin by getting comfortable and then do some slow breathing. Imagine a place you love being at. It could be somewhere from your childhood, somewhere you vacationed or just  a place in your imagination. Feel how it is to be in the place you're imagining. Pay attention to the sounds, air, colors, and who is there with you. This is a place where you feel cared for and loved. All is well. You are safe. Take in all the smells, sounds, tastes and feelings. As you do, feel your body being nourished and healed. Feel the calm that surrounds you. Breathe in all the good. Breathe out any discomfort or tension. 7. Sleep Enough If you get less than seven to eight hours of sleep, your body won't tolerate stress as well as it could. If stress keeps you up at night, address the cause, and add extra meditation into your day to make up for the lost z's. Try to get seven to nine hours of sleep each night. Make a regular bedtime schedule. Keep your room dark and cool. Try to avoid computers, TV, cell phones and tablets before bed. 8. Bond with Connections You Enjoy Go out for a coffee with a friend, chat with a neighbor, call a family member, visit with a clergy member, or even hang out with your pet. Clinical studies show that spending even a short time with a companion animal can cut anxiety levels almost in half. 9. Take a Vacation Getting away from it all can reset your stress tolerance by increasing your mental and emotional outlook, which makes you a happier, more productive person upon return. Leave your cellphone and laptop at home! 10. See a Counselor, Coach or Therapist If negative thoughts overwhelm your ability to make positive changes, it's time to seek professional help. Make an appointment today--your health and life are worth it."  If you are experiencing a Mental Health or [place] or need someone to talk to, please call the Suicide and Crisis Lifeline: 988    Patient Goals: Initial goal        08/18/2022    9:45 AM 07/26/2022    9:50 AM 04/22/2022    9:29 AM 03/04/2022   10:47 AM 12/28/2021    9:40 AM  Depression screen PHQ 2/9  Decreased Interest 1 1  0 1 1  Down, Depressed, Hopeless 1 1 0 0 1  PHQ - 2 Score 2 2 0 1 2  Altered sleeping '1 1 1 2 1  ' Tired, decreased energy '1 1 1 1 1  ' Change in appetite '1 1 1 1 1  ' Feeling bad or failure about yourself  0 0 0 0 1  Trouble concentrating 1 1 0 1 1  Moving slowly or fidgety/restless 0 0 1 1 0  Suicidal thoughts 0 0 0 0 0  PHQ-9 Score '6 6 4 7 7  ' Difficult doing work/chores Somewhat difficult       Follow up:  Patient agrees to Care Plan and Follow-up.  Plan: The Managed Medicaid care management team will reach out  to the patient again over the next 30 days.  Date/time of next scheduled Social Work care management/care coordination outreach:  09/01/22 at [place], [place], MSW, [place] Medicaid LCSW [email or web address] Phone: [phone]

## 2022-08-18 NOTE — Patient Instructions (Signed)
Visit Information  Ms. Crayton was given information about Medicaid Managed Care team care coordination services as a part of their [place] Medicaid benefit. [name] verbally consented to engagement with the [facility] Managed Care team.   If you are experiencing a medical emergency, please call 911 or report to your local emergency department or urgent care.   If you have a non-emergency medical problem during routine business hours, please contact your provider's office and ask to speak with a nurse.   For questions related to your [facility], please call: [phone] or visit the homepage here: [email or web address]  If you would like to schedule transportation through your [facility], please call the following number at least 2 days in advance of your appointment: [phone]   Rides for urgent appointments can also be made after hours by calling Member Services.  Call the [place] at [phone], at any time, 24 hours a day, 7 days a week. If you are in danger or need immediate medical attention call 911.  If you would like help to quit smoking, call 1-800-QUIT-NOW 573-556-8621) OR Espaol: 1-855-Djelo-Ya (2-229-798-9211) o para ms informacin haga clic aqu or Text READY to 200-400 to register via text  Following is a copy of your plan of care:  Care Plan : LCSW Plan of Care  Updates made by Greg Cutter, LCSW since 08/18/2022 12:00 AM     Problem: Coping Skills (General Plan of Care)      Goal: Coping Skills Enhanced   Start Date: 08/18/2022  Priority: High  Note:   Priority: High  Timeframe:  Long-Range Goal Priority:  High Start Date:   08/18/22     Expected End Date:  ongoing                     Follow Up Date--09/01/22 at 1030 am  - keep 90 percent of scheduled appointments -consider counseling in  addition to psychiatry  -consider bumping up your self-care  -consider creating a stronger support network   Why is this important?             Beating stress may take some time.            If you don't feel better right away, don't give up on your treatment plan.    Current barriers:   Chronic Mental Health needs related to bipolar and stress. Patient requires Support, Education, Resources, Referrals, Advocacy, and Care Coordination, in order to meet Unmet Mental Health Needs. Patient will implement clinical interventions discussed today to decrease symptoms of bipolar and increase knowledge and/or ability of: coping skills. Mental Health Concerns and Social Isolation Patient lacks knowledge of available community counseling agencies and resources.  Clinical Goal(s): verbalize understanding of plan for management of Bi polar and Stress and demonstrate a reduction in symptoms. Patient will connect with a provider for ongoing mental health treatment, increase coping skills, healthy habits, self-management skills, and stress reduction        Patient Goals/Self-Care Activities: Over the next 120 days Attend scheduled medical appointments Utilize healthy coping skills and supportive resources discussed Contact PCP with any questions or concerns Keep 90 percent of counseling appointments Call your insurance provider for more information about your Enhanced Benefits  Check out counseling resources provided  Begin personal counseling with LCSW, to reduce and manage symptoms of Depression and Stress, until well-established with mental health provider Accept all calls  from representative with [facility] in an effort to establish ongoing mental health counseling and supportive services. Incorporate into daily practice - relaxation techniques, deep breathing exercises, and mindfulness meditation strategies. Talk about feelings with friends, family members, spiritual advisor, etc. Contact LCSW directly  [phone]), if you have questions, need assistance, or if additional social work needs are identified between now and our next scheduled telephone outreach call. Call 988 for mental health hotline/crisis line if needed (24/7 available) Try techniques to reduce symptoms of anxiety/negative thinking (deep breathing, distraction, positive self talk, etc)  - develop a personal safety plan - develop a plan to deal with triggers like holidays, anniversaries - exercise at least 2 to 3 times per week - have a plan for how to handle bad days - journal feelings and what helps to feel better or worse - spend time or talk with others at least 2 to 3 times per week - watch for early signs of feeling worse - begin personal counseling - call and visit an old friend - check out volunteer opportunities - join a support group - laugh; watch a funny movie or comedian - learn and use visualization or guided imagery - perform a random act of kindness - practice relaxation or meditation daily - start or continue a personal journal - practice positive thinking and self-talk -continue with compliance of taking medication  -identify current effective and ineffective coping strategies.  -implement positive self-talk in care to increase self-esteem, confidence and feelings of control.  -consider alternative and complementary therapy approaches such as meditation, mindfulness or yoga.  -journaling, prayer, worship services, meditation or pastoral counseling.  -increase participation in pleasurable group activities such as hobbies, singing, sports or volunteering).  -consider the use of meditative movement therapy such as tai chi, yoga or qigong.  -start a regular daily exercise program based on tolerance, ability and patient choice to support positive thinking and activity    The following coping skill education was provided for stress relief and mental health management: "When your car dies or a deadline  looms, how do you respond? Long-term, low-grade or acute stress takes a serious toll on your body and mind, so don't ignore feelings of constant tension. Stress is a natural part of life. However, too much stress can harm our health, especially if it continues every day. This is chronic stress and can put you at risk for heart problems like heart disease and depression. Understand what's happening inside your body and learn simple coping skills to combat the negative impacts of everyday stressors.  Types of Stress There are two types of stress: Emotional - types of emotional stress are relationship problems, pressure at work, financial worries, experiencing discrimination or having a major life change. Physical - Examples of physical stress include being sick having pain, not sleeping well, recovery from an injury or having an alcohol and drug use disorder. Fight or Flight Sudden or ongoing stress activates your nervous system and floods your bloodstream with adrenaline and cortisol, two hormones that raise blood pressure, increase heart rate and spike blood sugar. These changes pitch your body into a fight or flight response. That enabled our ancestors to outrun saber-toothed tigers, and it's helpful today for situations like dodging a car accident. But most modern chronic stressors, such as finances or a challenging relationship, keep your body in that heightened state, which hurts your health. Effects of Too Much Stress If constantly under stress, most of [place] will eventually start to function less well.  Multiple studies link chronic stress to a higher risk of heart disease, stroke, depression, weight gain, memory loss and even premature death, so it's important to recognize the warning signals. Talk to your doctor about ways to manage stress if you're experiencing any of these symptoms: Prolonged periods of poor sleep. Regular, severe headaches. Unexplained weight loss or gain. Feelings of  isolation, withdrawal or worthlessness. Constant anger and irritability. Loss of interest in activities. Constant worrying or obsessive thinking. Excessive alcohol or drug use. Inability to concentrate.  10 Ways to Cope with Chronic Stress It's key to recognize stressful situations as they occur because it allows you to focus on managing how you react. We all need to know when to close our eyes and take a deep breath when we feel tension rising. Use these tips to prevent or reduce chronic stress. 1. Rebalance Work and Home All work and no play? If you're spending too much time at the office, intentionally put more dates in your calendar to enjoy time for fun, either alone or with others. 2. Get Regular Exercise Moving your body on a regular basis balances the nervous system and increases blood circulation, helping to flush out stress hormones. Even a daily 20-minute walk makes a difference. Any kind of exercise can lower stress and improve your mood ? just pick activities that you enjoy and make it a regular habit. 3. Eat Well and Limit Alcohol and Stimulants Alcohol, nicotine and caffeine may temporarily relieve stress but have negative health impacts and can make stress worse in the long run. Well-nourished bodies cope better, so start with a good breakfast, add more organic fruits and vegetables for a well-balanced diet, avoid processed foods and sugar, try herbal tea and drink more water. 4. Connect with Supportive People Talking face to face with another person releases hormones that reduce stress. Lean on those good listeners in your life. 5. [place] Time Do you enjoy gardening, reading, listening to music or some other creative pursuit? Engage in activities that bring you pleasure and joy; research shows that reduces stress by almost half and lowers your heart rate, too. 6. Practice Meditation, Stress Reduction or Yoga Relaxation techniques activate a state of restfulness that  counterbalances your body's fight-or-flight hormones. Even if this also means a 10-minute break in a long day: listen to music, read, go for a walk in nature, do a hobby, take a bath or spend time with a friend. Also consider doing a mindfulness exercise or try a daily deep breathing or imagery practice. Deep Breathing Slow, calm and deep breathing can help you relax. Try these steps to focus on your breathing and repeat as needed. Find a comfortable position and close your eyes. Exhale and drop your shoulders. Breathe in through your nose; fill your lungs and then your belly. Think of relaxing your body, quieting your mind and becoming calm and peaceful. Breathe out slowly through your nose, relaxing your belly. Think of releasing tension, pain, worries or distress. Repeat steps three and four until you feel relaxed. Imagery This involves using your mind to excite the senses -- sound, vision, smell, taste and feeling. This may help ease your stress. Begin by getting comfortable and then do some slow breathing. Imagine a place you love being at. It could be somewhere from your childhood, somewhere you vacationed or just a place in your imagination. Feel how it is to be in the place you're imagining. Pay attention to the sounds, air, colors, and who  is there with you. This is a place where you feel cared for and loved. All is well. You are safe. Take in all the smells, sounds, tastes and feelings. As you do, feel your body being nourished and healed. Feel the calm that surrounds you. Breathe in all the good. Breathe out any discomfort or tension. 7. Sleep Enough If you get less than seven to eight hours of sleep, your body won't tolerate stress as well as it could. If stress keeps you up at night, address the cause, and add extra meditation into your day to make up for the lost z's. Try to get seven to nine hours of sleep each night. Make a regular bedtime schedule. Keep your room dark and cool. Try  to avoid computers, TV, cell phones and tablets before bed. 8. Bond with Connections You Enjoy Go out for a coffee with a friend, chat with a neighbor, call a family member, visit with a clergy member, or even hang out with your pet. Clinical studies show that spending even a short time with a companion animal can cut anxiety levels almost in half. 9. Take a Vacation Getting away from it all can reset your stress tolerance by increasing your mental and emotional outlook, which makes you a happier, more productive person upon return. Leave your cellphone and laptop at home! 10. See a Counselor, Coach or Therapist If negative thoughts overwhelm your ability to make positive changes, it's time to seek professional help. Make an appointment today--your health and life are worth it."  If you are experiencing a Mental Health or [place] or need someone to talk to, please call the Suicide and Crisis Lifeline: 988    Patient Goals: Initial goal

## 2022-09-01 ENCOUNTER — Other Ambulatory Visit: Payer: Self-pay

## 2022-09-01 NOTE — Patient Instructions (Signed)
Kaitlyn Hunt ,   The [facility] Managed Care Team is available to provide assistance to you with your healthcare needs at no cost and as a benefit of your [facility] Health plan. I'm sorry I was unable to reach you today for our scheduled appointment. Our care guide will call you to reschedule our telephone appointment. Please call me at the number below. I am available to be of assistance to you regarding your healthcare needs. .   Thank you,   Eula Fried, BSW, MSW, LCSW Managed Medicaid LCSW [email or web address] Phone: [phone]

## 2022-09-01 NOTE — Patient Outreach (Signed)
  Medicaid Managed Care   Unsuccessful Attempt Note   96/43/8381 Name: Kaitlyn Hunt MRN: [id number] DOB: [birth year]  Referred by: [name], MD Reason for referral : High Risk Managed Medicaid   An unsuccessful telephone outreach was attempted today. The patient was referred to the case management team for assistance with care management and care coordination.    Follow Up Plan: The Managed Medicaid care management team will reach out to the patient again over the next 30 days.   Eula Fried, BSW, MSW, [employer] Managed Medicaid LCSW [email or web address] Phone: [phone]

## 2022-09-02 ENCOUNTER — Telehealth: Payer: Self-pay | Admitting: Family Medicine

## 2022-09-02 NOTE — Telephone Encounter (Signed)
..   Medicaid Managed Care   Unsuccessful Outreach Note  16/08/9603 Name: Kaitlyn Hunt MRN: [id number] DOB: [birth year]  Referred by: [name], MD Reason for referral : High Risk Managed Medicaid (I called the patient today to get her rescheduled with the MM LCSW. She did not answer and her VM was not set up.)   A second unsuccessful telephone outreach was attempted today. The patient was referred to the case management team for assistance with care management and care coordination.   Follow Up Plan: The care management team will reach out to the patient again over the next 7 days.   [place]

## 2022-09-06 ENCOUNTER — Telehealth: Payer: Self-pay | Admitting: Family Medicine

## 2022-09-06 NOTE — Telephone Encounter (Signed)
..   Medicaid Managed Care   Unsuccessful Outreach Note  19/75/8832 Name: Kaitlyn Hunt MRN: [id number] DOB: 29-Nov-1990  Referred by: [name], MD Reason for referral : High Risk Managed Medicaid (I called the patient today to get her rescheduled with the MM LCSW. She did not answer and I was not able to leave a message.)   Third unsuccessful telephone outreach was attempted today. The patient was referred to the case management team for assistance with care management and care coordination. The patient's primary care provider has been notified of our unsuccessful attempts to make or maintain contact with the patient. The care management team is pleased to engage with this patient at any time in the future should he/she be interested in assistance from the care management team.   Follow Up Plan: We have been unable to make contact with the patient for follow up. The care management team is available to follow up with the patient after provider conversation with the patient regarding recommendation for care management engagement and subsequent re-referral to the care management team.    [place], [place]

## 2022-09-07 ENCOUNTER — Other Ambulatory Visit: Payer: Self-pay | Admitting: *Deleted

## 2022-09-07 NOTE — Patient Outreach (Signed)
  Medicaid Managed Care   Unsuccessful Attempt Note   33/58/2518 Name: Kaitlyn Hunt MRN: [id number] DOB: [birth year]  Referred by: [name], MD Reason for referral : High Risk Managed Medicaid (Unsuccessful RNCM telephone follow up )   Third unsuccessful telephone outreach was attempted today. The patient was referred to the case management team for assistance with care management and care coordination. The patient's primary care provider has been notified of our unsuccessful attempts to make or maintain contact with the patient. The care management team is pleased to engage with this patient at any time in the future should he/she be interested in assistance from the care management team.    Follow Up Plan: The Managed Medicaid care management team is available to follow up with the patient after provider conversation with the patient regarding recommendation for care management engagement and subsequent re-referral to the care management team.     Lurena Joiner RN, BSN [place] RN Care Coordinator

## 2022-09-09 ENCOUNTER — Other Ambulatory Visit: Payer: Self-pay

## 2022-09-09 NOTE — Patient Outreach (Signed)
Care Coordination  03/88/8280  TYRELL BRERETON 0/34[birth year]9179 [phone]    Medicaid Managed Care   Unsuccessful Outreach Note  80/16/5537 Name: Kaitlyn Hunt MRN: [id number] DOB: 09/25/1991  Referred by: [name], MD Reason for referral : High Risk Managed Medicaid (MM Social Work [employer] )   An unsuccessful telephone outreach was attempted today. The patient was referred to the case management team for assistance with care management and care coordination.   Follow Up Plan: The care management team will reach out to the patient again over the next 30 days.   Mickel Fuchs, BSW, [place] Managed Medicaid Team  [phone]

## 2022-09-09 NOTE — Patient Instructions (Signed)
Visit Information  Ms. [name]  - as a part of your Medicaid benefit, you are eligible for care management and care coordination services at no cost or copay. I was unable to reach you by phone today but would be happy to help you with your health related needs. Please feel free to call me @ [phone].   A member of the Managed Medicaid care management team will reach out to you again over the next 30 days.  Mickel Fuchs, BSW, [place] Managed Medicaid Team  [phone]

## 2022-10-11 ENCOUNTER — Ambulatory Visit: Payer: Medicaid Other

## 2022-10-24 NOTE — Progress Notes (Signed)
    SUBJECTIVE:   CHIEF COMPLAINT / HPI:   Contraceptive management Here for depo shot. LMP was ***.   PERTINENT  PMH / PSH: hyperthyroidism, bipolar  OBJECTIVE:   There were no vitals taken for this visit.  ***  ASSESSMENT/PLAN:   No problem-specific Assessment & Plan notes found for this encounter.     Gerrit Heck, MD [place]

## 2022-10-25 ENCOUNTER — Encounter: Payer: Self-pay | Admitting: Student

## 2022-10-25 ENCOUNTER — Ambulatory Visit (INDEPENDENT_AMBULATORY_CARE_PROVIDER_SITE_OTHER): Payer: Medicaid Other | Admitting: Student

## 2022-10-25 ENCOUNTER — Other Ambulatory Visit (HOSPITAL_COMMUNITY)
Admission: RE | Admit: 2022-10-25 | Discharge: 2022-10-25 | Disposition: A | Discharge status: Home / Self Care | Payer: Medicaid Other | Source: Ambulatory Visit | Attending: Family Medicine | Admitting: Family Medicine

## 2022-10-25 VITALS — BP 120/88 | HR 79 | Ht 63.0 in | Wt 109.2 lb

## 2022-10-25 DIAGNOSIS — Z113 Encounter for screening for infections with a predominantly sexual mode of transmission: Secondary | ICD-10-CM

## 2022-10-25 DIAGNOSIS — Z789 Other specified health status: Secondary | ICD-10-CM

## 2022-10-25 DIAGNOSIS — A599 Trichomoniasis, unspecified: Secondary | ICD-10-CM | POA: Diagnosis not present

## 2022-10-25 DIAGNOSIS — N760 Acute vaginitis: Secondary | ICD-10-CM | POA: Diagnosis not present

## 2022-10-25 DIAGNOSIS — B9689 Other specified bacterial agents as the cause of diseases classified elsewhere: Secondary | ICD-10-CM | POA: Diagnosis not present

## 2022-10-25 LAB — POCT WET PREP (WET MOUNT): Clue Cells Wet Prep Whiff POC: POSITIVE

## 2022-10-25 MED ORDER — METRONIDAZOLE 500 MG PO TABS: 500.0000 mg | ORAL_TABLET | Freq: Two times a day (BID) | ORAL | 0 refills | Status: AC

## 2022-10-25 MED ORDER — MEDROXYPROGESTERONE ACETATE 150 MG/ML IM SUSP
150.0000 mg | Freq: Once | INTRAMUSCULAR | Status: AC
  Administered 2022-10-25: 150 mg via INTRAMUSCULAR

## 2022-10-25 NOTE — Telephone Encounter (Signed)
Patient calls nurse line regarding this concern. She reports that oral medication gives her GI upset. She is requesting that vaginal cream be sent in as alternative.   Forwarding request to Dr. Jinny Sanders.   Talbot Grumbling, RN

## 2022-10-25 NOTE — Patient Instructions (Signed)
It was great to see you! Thank you for allowing me to participate in your care!   Our plans for today:  - we will follow up on what these swab results show!  Take care and seek immediate care sooner if you develop any concerns.  Gerrit Heck, MD

## 2022-10-26 LAB — CERVICOVAGINAL ANCILLARY ONLY
Chlamydia: NEGATIVE
Comment: NEGATIVE
Comment: NEGATIVE
Comment: NORMAL
Neisseria Gonorrhea: NEGATIVE
Trichomonas: POSITIVE — AB

## 2022-11-02 ENCOUNTER — Ambulatory Visit (HOSPITAL_COMMUNITY): Payer: Medicaid Other | Admitting: Student

## 2022-12-16 ENCOUNTER — Encounter: Payer: Self-pay | Admitting: Family Medicine

## 2022-12-16 ENCOUNTER — Ambulatory Visit: Payer: Medicaid Other | Admitting: Family Medicine

## 2022-12-16 ENCOUNTER — Telehealth: Payer: Self-pay | Admitting: Family Medicine

## 2022-12-16 NOTE — Telephone Encounter (Addendum)
No-show letter sent via [place]. Attempt made to call and discuss no-show policy twice failed.

## 2022-12-23 ENCOUNTER — Encounter: Payer: Self-pay | Admitting: Family Medicine

## 2022-12-23 ENCOUNTER — Ambulatory Visit: Payer: Medicaid Other | Admitting: Family Medicine

## 2022-12-23 NOTE — Telephone Encounter (Signed)
She no showed again to her appointment on 12/23/22. Multiple call attempts were made to reach her. I have send her a certified letter regarding this. She will be dismissed from the practice for future no show or same day cancellation.   Letter handed to [place] to mail.

## 2023-01-20 ENCOUNTER — Ambulatory Visit: Payer: Medicaid Other

## 2023-01-23 ENCOUNTER — Ambulatory Visit (INDEPENDENT_AMBULATORY_CARE_PROVIDER_SITE_OTHER): Payer: Medicaid Other

## 2023-01-23 DIAGNOSIS — Z3042 Encounter for surveillance of injectable contraceptive: Secondary | ICD-10-CM

## 2023-01-23 MED ORDER — MEDROXYPROGESTERONE ACETATE 150 MG/ML IM SUSY
150.0000 mg | PREFILLED_SYRINGE | Freq: Once | INTRAMUSCULAR | Status: AC
  Administered 2023-01-23: 150 mg via INTRAMUSCULAR

## 2023-01-23 NOTE — Progress Notes (Signed)
Patient here today for Depo Provera injection and is within her dates.    Last contraceptive appt was 10/25/2022.  Depo given in [place] today.  Site unremarkable & patient tolerated injection.    Next injection due 04/10/2023-04/24/2023.  Reminder card given.

## 2023-03-28 ENCOUNTER — Ambulatory Visit: Payer: Medicaid Other | Admitting: Family Medicine

## 2023-04-18 ENCOUNTER — Other Ambulatory Visit (HOSPITAL_COMMUNITY)
Admission: RE | Admit: 2023-04-18 | Discharge: 2023-04-18 | Disposition: A | Discharge status: Home / Self Care | Payer: Medicaid Other | Source: Ambulatory Visit | Attending: Family Medicine | Admitting: Family Medicine

## 2023-04-18 ENCOUNTER — Ambulatory Visit (INDEPENDENT_AMBULATORY_CARE_PROVIDER_SITE_OTHER): Payer: Medicaid Other | Admitting: Family Medicine

## 2023-04-18 ENCOUNTER — Encounter: Payer: Self-pay | Admitting: Family Medicine

## 2023-04-18 VITALS — BP 116/75 | HR 84 | Ht 63.0 in | Wt 109.8 lb

## 2023-04-18 DIAGNOSIS — Z3042 Encounter for surveillance of injectable contraceptive: Secondary | ICD-10-CM

## 2023-04-18 DIAGNOSIS — A5909 Other urogenital trichomoniasis: Secondary | ICD-10-CM | POA: Insufficient documentation

## 2023-04-18 DIAGNOSIS — R519 Headache, unspecified: Secondary | ICD-10-CM

## 2023-04-18 DIAGNOSIS — Z113 Encounter for screening for infections with a predominantly sexual mode of transmission: Secondary | ICD-10-CM

## 2023-04-18 DIAGNOSIS — F3170 Bipolar disorder, currently in remission, most recent episode unspecified: Secondary | ICD-10-CM

## 2023-04-18 DIAGNOSIS — E049 Nontoxic goiter, unspecified: Secondary | ICD-10-CM | POA: Diagnosis not present

## 2023-04-18 LAB — POCT URINE PREGNANCY: Preg Test, Ur: NEGATIVE

## 2023-04-18 MED ORDER — MEDROXYPROGESTERONE ACETATE 150 MG/ML IM SUSP
150.0000 mg | Freq: Once | INTRAMUSCULAR | Status: AC
  Administered 2023-04-18: 150 mg via INTRAMUSCULAR

## 2023-04-18 NOTE — Assessment & Plan Note (Signed)
??   Migraine vs stress headache. She is currently asymptomatic No neurologic deficit on her exam Continue Tylenol prn. May trial Magnesium oxide. Return precautions discussed.

## 2023-04-18 NOTE — Assessment & Plan Note (Signed)
Upreg negative today. Depo shot given

## 2023-04-18 NOTE — Patient Instructions (Signed)

## 2023-04-18 NOTE — Assessment & Plan Note (Signed)
TOC completed today. She declined HIV or RPR blood test today.

## 2023-04-18 NOTE — Progress Notes (Signed)
    SUBJECTIVE:   CHIEF COMPLAINT / HPI:   Bipolar: Not on meds. She reports doing well. However, when stressed, she can become symptomatic. She has yet to schedule f/u with Psych, although she has her referral information.  Goitre: Need f/u for this.  Contraception:  Missed her Depo shot and would like to get back on it. Her last sexual activity was about 2 weeks ago. No new concerns.  STD screen: Following up on her positive Trich 5 months ago.  Headaches:  C/O intermittent temporal headache x a few weeks. This occurs almost everyday or every other day. She uses Tylenol as needed as well as Excedrin with some improvement. No headache today.   PERTINENT  PMH / PSH: PMHx reviewed  OBJECTIVE:   BP 116/75   Pulse 84   Ht 5\' 3"  (1.6 m)   Wt 109 lb 12.8 oz (49.8 kg)   SpO2 100%   BMI 19.45 kg/m   Physical Exam Vitals and nursing note reviewed. Exam conducted with a chaperone present Cleatrice Burke).  Neck:     Comments: No obvious Thyromegaly Cardiovascular:     Rate and Rhythm: Normal rate and regular rhythm.     Pulses: Normal pulses.     Heart sounds: Normal heart sounds. No murmur heard. Pulmonary:     Effort: Pulmonary effort is normal. No respiratory distress.     Breath sounds: Normal breath sounds. No wheezing.  Abdominal:     General: Abdomen is flat. Bowel sounds are normal. There is no distension.     Palpations: Abdomen is soft.     Tenderness: There is no abdominal tenderness.  Genitourinary:    Vagina: Normal.     Cervix: No discharge or friability.  Neurological:     General: No focal deficit present.     Cranial Nerves: Cranial nerves 2-12 are intact.     Sensory: Sensation is intact.     Motor: Motor function is intact.     Coordination: Coordination is intact.     Deep Tendon Reflexes: Reflexes are normal and symmetric.  Psychiatric:        Mood and Affect: Mood normal.      ASSESSMENT/PLAN:   Bipolar disorder Stable off  meds. Discussed Psych referral. She will make f/u appointment.   GOITER, UNSPECIFIED TSH offered today.  She prefer to defer lab test to later date.   Contraception management Upreg negative today. Depo shot given   Trichomonal cervicitis TOC completed today. She declined HIV or RPR blood test today.   Headache(784.0) ?? Migraine vs stress headache. She is currently asymptomatic No neurologic deficit on her exam Continue Tylenol prn. May trial Magnesium oxide. Return precautions discussed.    She declined COVID shot.   Janit Pagan, MD [facility]

## 2023-04-18 NOTE — Assessment & Plan Note (Signed)
TSH offered today.  She prefer to defer lab test to later date.

## 2023-04-18 NOTE — Assessment & Plan Note (Signed)
Stable off meds. Discussed Psych referral. She will make f/u appointment.

## 2023-04-19 LAB — CERVICOVAGINAL ANCILLARY ONLY
Chlamydia: NEGATIVE
Comment: NEGATIVE
Comment: NEGATIVE
Comment: NORMAL
Neisseria Gonorrhea: NEGATIVE
Trichomonas: NEGATIVE

## 2023-04-20 ENCOUNTER — Encounter: Payer: Self-pay | Admitting: Family Medicine

## 2023-04-24 ENCOUNTER — Telehealth: Payer: Self-pay | Admitting: Family Medicine

## 2023-04-24 NOTE — Telephone Encounter (Signed)
I was unable to reach her via telephone call by calling all numbers listed.  I was unable to leave a message on her phone.  I send a MyChart message about her negative STD screen which she has yet to read.  Should she call us, please let her know that STD screening is negative. Follow up as needed.

## 2023-05-09 ENCOUNTER — Ambulatory Visit: Payer: Medicaid Other | Admitting: Family Medicine

## 2023-05-09 NOTE — Progress Notes (Deleted)
    SUBJECTIVE:   CHIEF COMPLAINT / HPI:  No chief complaint on file.   ***  PERTINENT  PMH / PSH: ***  Patient Care Team: Doreene Eland, MD as PCP - General (Family Medicine) Shaune Leeks as Social Worker   OBJECTIVE:   There were no vitals taken for this visit.  Physical Exam      04/18/2023    8:57 AM  Depression screen PHQ 2/9  Decreased Interest 1  Down, Depressed, Hopeless 1  PHQ - 2 Score 2  Altered sleeping 1  Tired, decreased energy 1  Change in appetite 1  Feeling bad or failure about yourself  0  Trouble concentrating 0  Moving slowly or fidgety/restless 0  Suicidal thoughts 0  PHQ-9 Score 5     {Show previous vital signs (optional):23777}  {Labs  Heme  Chem  Endocrine  Serology  Results Review (optional):23779}  ASSESSMENT/PLAN:   No problem-specific Assessment & Plan notes found for this encounter.    No follow-ups on file.   Littie Deeds, MD [facility]

## 2023-05-11 ENCOUNTER — Ambulatory Visit (INDEPENDENT_AMBULATORY_CARE_PROVIDER_SITE_OTHER): Payer: Medicaid Other | Admitting: Family Medicine

## 2023-05-11 ENCOUNTER — Other Ambulatory Visit: Payer: Self-pay

## 2023-05-11 ENCOUNTER — Encounter: Payer: Self-pay | Admitting: Family Medicine

## 2023-05-11 VITALS — BP 126/82 | HR 94 | Ht 63.0 in | Wt 108.0 lb

## 2023-05-11 DIAGNOSIS — G44229 Chronic tension-type headache, not intractable: Secondary | ICD-10-CM

## 2023-05-11 DIAGNOSIS — M545 Low back pain, unspecified: Secondary | ICD-10-CM | POA: Diagnosis not present

## 2023-05-11 DIAGNOSIS — M25561 Pain in right knee: Secondary | ICD-10-CM | POA: Diagnosis not present
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
## 2023-05-13 DIAGNOSIS — M545 Low back pain, unspecified: Secondary | ICD-10-CM | POA: Insufficient documentation

## 2023-05-13 HISTORY — DX: Low back pain, unspecified: M54.50
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
## 2023-06-14 ENCOUNTER — Encounter: Payer: Self-pay | Admitting: Family Medicine
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
## 2023-07-20 ENCOUNTER — Ambulatory Visit: Payer: Medicaid Other

## 2023-07-26 ENCOUNTER — Ambulatory Visit: Payer: Medicaid Other

## 2023-07-28 ENCOUNTER — Telehealth: Payer: Self-pay | Admitting: Family Medicine

## 2023-07-28 ENCOUNTER — Ambulatory Visit: Payer: Medicaid Other

## 2023-07-28 NOTE — Telephone Encounter (Signed)
Called to remind her of her Depo shot appointment for today.  I offered for her to get flu shot at the same time.  She said she does not want to get flu shot.

## 2023-07-31 ENCOUNTER — Ambulatory Visit (INDEPENDENT_AMBULATORY_CARE_PROVIDER_SITE_OTHER): Payer: Medicaid Other

## 2023-07-31 DIAGNOSIS — Z3042 Encounter for surveillance of injectable contraceptive: Secondary | ICD-10-CM

## 2023-07-31 LAB — POCT URINE PREGNANCY: Preg Test, Ur: NEGATIVE

## 2023-07-31 MED ORDER — MEDROXYPROGESTERONE ACETATE 150 MG/ML IM SUSY: 150.0000 mg | PREFILLED_SYRINGE | Freq: Once | INTRAMUSCULAR | Status: AC

## 2023-07-31 NOTE — Progress Notes (Signed)
Patient here today for Depo Provera injection and is not within her dates. Urine pregnancy test obtained and negative. Patient advised to use backup birth control for 1 week.   Last contraceptive appt was 10/25/2022.   Depo given in RUOQ today.  Site unremarkable & patient tolerated injection.     Next injection due 10/16/2023-10/30/2023.  Reminder card given.

## 2023-08-03 ENCOUNTER — Ambulatory Visit (INDEPENDENT_AMBULATORY_CARE_PROVIDER_SITE_OTHER): Payer: Medicaid Other | Admitting: Student

## 2023-08-03 ENCOUNTER — Encounter: Payer: Self-pay | Admitting: Student

## 2023-08-03 VITALS — BP 118/70 | HR 78 | Wt 106.0 lb

## 2023-08-03 DIAGNOSIS — R233 Spontaneous ecchymoses: Secondary | ICD-10-CM

## 2023-08-03 HISTORY — DX: Spontaneous ecchymoses: R23.3

## 2023-08-03 NOTE — Assessment & Plan Note (Signed)
Patient comes in for concern of pain and bruising that began on right anterior thigh, following Depo injection.  Patient received injection on Monday, woke up with pain and bruise on Tuesday morning.  Patient reports bruising and pain have decreased/are resolving, will is very concerned about this episode.  Patient denies any history of trauma.  Patient's bruise located in the anterior thigh however patient received injection in lateral gluteus muscle.  I do not believe this bruising is coming from the injection site although patient has tenderness to palpation leading up to injection site, from the bruise.  Unsure what caused her bruising however it appears to be resolving and patient is doing well.  Will have patient continue to monitor with return precautions.  Patient okay to continue Depo. - Continue to monitor - Continue Depo - Return precautions for worsening pain, worsening bruising
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
## 2023-10-16 ENCOUNTER — Ambulatory Visit: Payer: Medicaid Other

## 2023-10-16 DIAGNOSIS — Z3042 Encounter for surveillance of injectable contraceptive: Secondary | ICD-10-CM | POA: Diagnosis present

## 2023-10-16 MED ORDER — MEDROXYPROGESTERONE ACETATE 150 MG/ML IM SUSP
150.0000 mg | Freq: Once | INTRAMUSCULAR | Status: AC
  Administered 2023-10-16: 150 mg via INTRAMUSCULAR

## 2023-10-16 NOTE — Progress Notes (Signed)
Patient here today for Depo Provera injection and is within her dates.    Last contraceptive appt was 10/25/2022. Advised patient that she will need to schedule next appointment with PCP for annual contraceptive appointment.   Depo given in LUOQ today.  Site unremarkable & patient tolerated injection.    Next injection due 2/10-25-2/24-25.  Reminder card given.    Veronda Prude, RN

## 2024-01-16 ENCOUNTER — Ambulatory Visit: Payer: 59 | Admitting: Family Medicine

## 2024-01-16 ENCOUNTER — Encounter: Payer: Self-pay | Admitting: Family Medicine

## 2024-01-29 ENCOUNTER — Encounter: Payer: Self-pay | Admitting: Family Medicine

## 2024-02-02 ENCOUNTER — Encounter: Payer: Self-pay | Admitting: Family Medicine

## 2024-02-02 ENCOUNTER — Ambulatory Visit (INDEPENDENT_AMBULATORY_CARE_PROVIDER_SITE_OTHER): Payer: 59 | Admitting: Family Medicine

## 2024-02-02 VITALS — BP 112/62 | HR 96 | Ht 63.0 in | Wt 116.0 lb

## 2024-02-02 DIAGNOSIS — Z23 Encounter for immunization: Secondary | ICD-10-CM | POA: Diagnosis not present

## 2024-02-02 DIAGNOSIS — E079 Disorder of thyroid, unspecified: Secondary | ICD-10-CM | POA: Insufficient documentation

## 2024-02-02 DIAGNOSIS — F17200 Nicotine dependence, unspecified, uncomplicated: Secondary | ICD-10-CM

## 2024-02-02 DIAGNOSIS — Z789 Other specified health status: Secondary | ICD-10-CM | POA: Diagnosis not present

## 2024-02-02 DIAGNOSIS — Z309 Encounter for contraceptive management, unspecified: Secondary | ICD-10-CM

## 2024-02-02 DIAGNOSIS — F3170 Bipolar disorder, currently in remission, most recent episode unspecified: Secondary | ICD-10-CM

## 2024-02-02 LAB — POCT URINE PREGNANCY: Preg Test, Ur: NEGATIVE

## 2024-02-02 MED ORDER — MEDROXYPROGESTERONE ACETATE 150 MG/ML IM SUSP
150.0000 mg | Freq: Once | INTRAMUSCULAR | Status: AC
Start: 2024-02-02 — End: 2024-02-02
  Administered 2024-02-02: 150 mg via INTRAMUSCULAR

## 2024-02-02 NOTE — Patient Instructions (Addendum)
 It was nice seeing you today. We gave Depo shot today as your urine pregnancy test is negative. However, you run the risk of getting pregnant since you had an unprotected sex recently.

## 2024-02-02 NOTE — Assessment & Plan Note (Signed)
 Asymptomatic TSH checked today

## 2024-02-02 NOTE — Assessment & Plan Note (Signed)
 PCV20 recommended She declined We will work on smoking cessation at subsequent visits

## 2024-02-02 NOTE — Progress Notes (Signed)
    SUBJECTIVE:   CHIEF COMPLAINT / HPI:   Bipolar: She endorsed coping without medication. No HI/SI  Thyroid dysfunction: Need lab f/u. Otherwise, she is asymptomatic.  Contraceptive management:  Missed depo shot time frame. She has been sexually active with and without condom use. The last protected sex was yesterday. She has had irregular periods since being on Depo and spots here and there.   Social hx: Smokes 1-2 packs of tobacco per day since age 26 (14 years). She quit for 1 month in 2012 and restarted due to stress. She is an occasional alcohol drinker.   PERTINENT  PMH / PSH: PMHx reviewed  OBJECTIVE:   BP 112/62   Pulse 96   Ht 5\' 3"  (1.6 m)   Wt 116 lb (52.6 kg)   SpO2 99%   BMI 20.55 kg/m   Physical Exam Vitals and nursing note reviewed.  Neck:     Comments: No Thyromegaly Cardiovascular:     Rate and Rhythm: Normal rate and regular rhythm.     Pulses: Normal pulses.     Heart sounds: Normal heart sounds. No murmur heard. Pulmonary:     Effort: Pulmonary effort is normal. No respiratory distress.     Breath sounds: Normal breath sounds. No wheezing.  Abdominal:     General: Abdomen is flat. Bowel sounds are normal. There is no distension.     Palpations: Abdomen is soft. There is no mass.     Tenderness: There is no abdominal tenderness.  Musculoskeletal:     Right lower leg: No edema.     Left lower leg: No edema.      ASSESSMENT/PLAN:   Bipolar disorder Stable off meds [facility] precaution provided She agreed with the plan  Thyroid dysfunction Asymptomatic TSH checked today  Contraception management Upreg negative She has been having unprotected sex intermittently I discussed risk of pregnancy and option given to wait on Depo vs getting it today She opted for shots today F/U in 2-4 weeks for pregnancy test She agreed with the plan  TOBACCO USER PCV20 recommended She declined We will work on smoking cessation at subsequent visits       Janit Pagan, MD [facility]

## 2024-02-02 NOTE — Assessment & Plan Note (Signed)
 Upreg negative She has been having unprotected sex intermittently I discussed risk of pregnancy and option given to wait on Depo vs getting it today She opted for shots today F/U in 2-4 weeks for pregnancy test She agreed with the plan
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
## 2024-02-03 ENCOUNTER — Encounter: Payer: Self-pay | Admitting: Family Medicine

## 2024-02-03 LAB — TSH: TSH: 1.58 u[IU]/mL (ref 0.450–4.500)

## 2024-02-08 NOTE — Telephone Encounter (Signed)
 Spoke with patient. She has an appt with Dr. Miquel Dunn for that issue on 3/24, and she stated that she will just keep that appt. Aquilla Solian, CMA

## 2024-02-09 ENCOUNTER — Ambulatory Visit

## 2024-02-12 ENCOUNTER — Ambulatory Visit: Admitting: Family Medicine

## 2024-02-26 ENCOUNTER — Ambulatory Visit: Admitting: Family Medicine

## 2024-02-26 ENCOUNTER — Ambulatory Visit: Admitting: Student

## 2024-02-26 NOTE — Progress Notes (Deleted)
    SUBJECTIVE:   CHIEF COMPLAINT / HPI:   Was seen 3/14 and received Depo shot, however there was concern that she could be pregnant given recent unprotected sex at that time.  Pregnancy test on that day was negative but she needs repeat test today  Developed yellow vaginal discharge after receiving her Depo shot, presents today for pelvic exam  PERTINENT  PMH / PSH: Bipolar disorder, tobacco use  OBJECTIVE:   There were no vitals taken for this visit.  ***  ASSESSMENT/PLAN:   Assessment & Plan      Para March, DO [facility]

## 2024-03-05 ENCOUNTER — Other Ambulatory Visit (HOSPITAL_COMMUNITY)
Admission: RE | Admit: 2024-03-05 | Discharge: 2024-03-05 | Disposition: A | Discharge status: Home / Self Care | Payer: Self-pay | Source: Ambulatory Visit | Attending: Family Medicine | Admitting: Family Medicine

## 2024-03-05 ENCOUNTER — Encounter: Payer: Self-pay | Admitting: Family Medicine

## 2024-03-05 ENCOUNTER — Ambulatory Visit (INDEPENDENT_AMBULATORY_CARE_PROVIDER_SITE_OTHER): Payer: Self-pay | Admitting: Family Medicine

## 2024-03-05 VITALS — BP 120/80 | HR 113 | Ht 63.0 in | Wt 112.4 lb

## 2024-03-05 DIAGNOSIS — Z309 Encounter for contraceptive management, unspecified: Secondary | ICD-10-CM | POA: Insufficient documentation

## 2024-03-05 LAB — POCT URINE PREGNANCY: Preg Test, Ur: NEGATIVE

## 2024-03-05 NOTE — Progress Notes (Signed)
   SUBJECTIVE:   CHIEF COMPLAINT / HPI:   Reproductive History - here today for pap. - G2P2 - Menses: spotting on depo - LMP: last September.  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 F/u PAP 2021, 2022, 2023 normal with negative HPV. - currently sexually active with one female partner(s) - denies abnormal vaginal discharge, rashes, ulcers   OBJECTIVE:   BP 120/80   Pulse (!) 113   Ht 5\' 3"  (1.6 m)   Wt 112 lb 6.4 oz (51 kg)   SpO2 100%   BMI 19.91 kg/m   Gen: well appearing, in NAD GYN:  External genitalia within normal limits.  Vaginal mucosa pink, moist, normal rugae.  Nonfriable cervix without lesions, no discharge or bleeding noted on speculum exam.  Ectropion noted. Bimanual exam revealed normal, nongravid uterus.  No cervical motion tenderness. No adnexal masses bilaterally.      ASSESSMENT/PLAN:   Contraception management Continues on depo. Upreg negative today. PAP obtained today, if remains normal, can likely space to q3 year checks.      Kandis Ormond, DO

## 2024-03-05 NOTE — Patient Instructions (Signed)
 It was great to see you!  Our plans for today:  - We did your pap smear today. We will release these results to your MyChart.  Take care and seek immediate care sooner if you develop any concerns.   Dr. Zoriah Pulice

## 2024-03-05 NOTE — Assessment & Plan Note (Addendum)
 Continues on depo. Upreg negative today. PAP obtained today, if remains normal, can likely space to q3 year checks.

## 2024-03-07 ENCOUNTER — Encounter: Payer: Self-pay | Admitting: Family Medicine

## 2024-03-07 ENCOUNTER — Other Ambulatory Visit: Payer: Self-pay | Admitting: Family Medicine

## 2024-03-07 DIAGNOSIS — Z113 Encounter for screening for infections with a predominantly sexual mode of transmission: Secondary | ICD-10-CM

## 2024-03-07 DIAGNOSIS — Z114 Encounter for screening for human immunodeficiency virus [HIV]: Secondary | ICD-10-CM

## 2024-03-08 LAB — CYTOLOGY - PAP
Chlamydia: NEGATIVE
Comment: NEGATIVE
Comment: NEGATIVE
Comment: NEGATIVE
Comment: NORMAL
High risk HPV: NEGATIVE
Neisseria Gonorrhea: NEGATIVE
Trichomonas: NEGATIVE

## 2024-03-11 ENCOUNTER — Encounter: Payer: Self-pay | Admitting: Family Medicine

## 2024-03-12 ENCOUNTER — Ambulatory Visit: Admitting: Family Medicine

## 2024-06-17 ENCOUNTER — Ambulatory Visit (INDEPENDENT_AMBULATORY_CARE_PROVIDER_SITE_OTHER): Payer: Self-pay

## 2024-06-17 DIAGNOSIS — Z3042 Encounter for surveillance of injectable contraceptive: Secondary | ICD-10-CM

## 2024-06-17 LAB — POCT URINE PREGNANCY: Preg Test, Ur: NEGATIVE

## 2024-06-17 MED ORDER — MEDROXYPROGESTERONE ACETATE 150 MG/ML IM SUSP
150.0000 mg | Freq: Once | INTRAMUSCULAR | Status: AC
  Administered 2024-06-17: 150 mg via INTRAMUSCULAR

## 2024-06-17 NOTE — Progress Notes (Signed)
 Patient here today for Depo Provera  injection and is not within her dates. Urine pregnancy obtained with negative result. She reports sexual activity approximately 1 week ago. Precepted with Dr. McDiarmid. Advised that patient could receive injection and follow up with repeat urine pregnancy in two weeks.   Last contraceptive appt was 03/05/2024  Depo given in LUOQ today.  Site unremarkable & patient tolerated injection.  Advised patient to use condoms for the next seven days if sexually active.   Next injection due 09/02/24-09/16/24.  Reminder card given.    Chiquita JAYSON English, RN

## 2024-09-18 ENCOUNTER — Ambulatory Visit: Payer: Self-pay

## 2024-09-18 ENCOUNTER — Telehealth: Payer: Self-pay | Admitting: Family Medicine

## 2024-09-18 MED ORDER — MEDROXYPROGESTERONE ACETATE 150 MG/ML IM SUSP: 150.0000 mg | Freq: Once | INTRAMUSCULAR | Status: DC

## 2024-09-18 NOTE — Telephone Encounter (Signed)
 Patient seen today for depo shot. I called to check post visit if she'd want to schedule flu shot appointment. She declined and stated she does not take the flu shot. HM/care gap updated. Follow up as needed. She agreed with the plan.

## 2024-09-19 ENCOUNTER — Ambulatory Visit: Payer: Self-pay

## 2024-09-26 NOTE — Progress Notes (Signed)
 Visit had to be cancelled due to depo provera  being out of stock.   Chiquita JAYSON English, RN

## 2024-10-25 ENCOUNTER — Ambulatory Visit: Payer: Self-pay | Admitting: Family Medicine

## 2024-10-31 ENCOUNTER — Ambulatory Visit (HOSPITAL_COMMUNITY)
Admission: EM | Admit: 2024-10-31 | Discharge: 2024-10-31 | Disposition: A | Discharge status: Home / Self Care | Payer: Self-pay | Attending: Nurse Practitioner | Admitting: Nurse Practitioner

## 2024-10-31 ENCOUNTER — Encounter (HOSPITAL_COMMUNITY): Payer: Self-pay

## 2024-10-31 DIAGNOSIS — Z3202 Encounter for pregnancy test, result negative: Secondary | ICD-10-CM

## 2024-10-31 DIAGNOSIS — L0201 Cutaneous abscess of face: Secondary | ICD-10-CM

## 2024-10-31 LAB — POCT URINE PREGNANCY: Preg Test, Ur: NEGATIVE

## 2024-10-31 MED ORDER — DOXYCYCLINE HYCLATE 100 MG PO CAPS: 100.0000 mg | ORAL_CAPSULE | Freq: Two times a day (BID) | ORAL | 0 refills | Status: AC

## 2024-10-31 NOTE — ED Provider Notes (Signed)
 [facility]    CSN: [id number] Arrival date & time: 10/31/24  1519      History   Chief Complaint Chief Complaint  Patient presents with   Facial Swelling   Dental Pain    HPI Kaitlyn Hunt is a 33 y.o. female.   Patient presents today with facial swelling to the side of the left eye.  Reports it began about 6 days ago and has been getting worse.  No fevers but has been a little bit nauseous; no vomiting.  She has been able to eat and drink normally without any issue.  Also reports dental pain and some holes in her teeth.  She has been taking extra Tylenol  with improvement.    Past Medical History:  Diagnosis Date   Abnormal bruising 08/03/2023   Amenorrhea 09/23/2014   ASCUS with positive high risk HPV 05/05/2015   ATTENTION DEFICIT, W/HYPERACTIVITY 01/18/2007   Qualifier: Diagnosis of  By: ROJELIO MD, HEIDI     Bipolar 1 disorder [facility])    previously on Depakote D/C 5/11 bc pt stated it made her feel lazy    Chorioamnionitis, delivered, current hospitalization 07/04/2019   Confirmed on placental pathology   CIN III (cervical intraepithelial neoplasia grade III) with severe dysplasia 01/22/2016   Depression    not currently taking bipolar meds, crashes some when a lot is coming at her, stress   Eclampsia in pregnancy    one seizure after delivery of first child   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    Hyperthyroidism    Low TSH level 04/27/2015   Lower back pain 05/13/2023   Ovarian mass, right 02/04/2016   Pregnancy affected by fetal growth restriction 06/18/2019   Weekly dopplers and MFM screening    Pregnancy induced hypertension    Preterm premature rupture of membranes (PPROM) delivered at [redacted]w[redacted]d due to Triple I 06/28/2019   PROBLEMS, BEHAVIORAL NEC 08/03/2007   Qualifier: Diagnosis of  By: ROJELIO MD, HEIDI     Right shoulder pain 04/22/2022   Supervision of high-risk pregnancy 12/14/2018    Nursing Staff Provider Office Location  [facility] Dating  1st trimester US  Language   Anatomy US    Flu Vaccine  Declined Genetic Screen  NIPS: Low risk female     TDaP vaccine    Hgb A1C or  GTT  Third trimester: normal Rhogam  n/a   LAB RESULTS  Feeding Plan Bottle Blood Type --/--/O POS Performed at [facility], [address] [place], [place] [address]  [phone])  Contraception  depo pro   Trichimoniasis    Vaginal Pap smear, abnormal     Patient Active Problem List   Diagnosis Date Noted   Thyroid  dysfunction 02/02/2024   Boxer's metacarpal fracture, neck, closed 08/17/2021   Trichomonal cervicitis 10/17/2015   Bipolar disorder (HCC) 10/09/2013   Right knee pain 10/09/2013   Contraception management 02/25/2013   TOBACCO USER 08/08/2009   Headache(784.0) 10/31/2007    Past Surgical History:  Procedure Laterality Date   CESAREAN SECTION  2012   Fetal intolerance following epidural   CESAREAN SECTION N/A 07/01/2019   Procedure: CESAREAN SECTION;  Surgeon: Izell Harari, MD;  Location: [facility] LD ORS;  Service: Obstetrics;  Laterality: N/A;   LEEP     WISDOM TOOTH EXTRACTION      OB History     Gravida  2   Para  2   Term      Preterm  2   AB      Living  2      SAB      IAB      Ectopic      Multiple  0   Live Births  2            Home Medications    Prior to Admission medications  Medication Sig Start Date End Date Taking? Authorizing Provider  doxycycline  (VIBRAMYCIN ) 100 MG capsule Take 1 capsule (100 mg total) by mouth 2 (two) times daily for 7 days. 10/31/24 11/07/24 Yes Chandra Harlene LABOR, NP  ibuprofen  (ADVIL ) 800 MG tablet Take 1 tablet  (800 mg total) by mouth every 8 (eight) hours as needed. Take with food to prevent GI upset 10/31/24  Yes Chandra Harlene A, NP  diphenhydrAMINE  (BENADRYL  ALLERGY) 25 mg capsule Take 1 capsule (25 mg total) by mouth every 8 (eight) hours as needed for itching. Patient not taking: Reported on 04/22/2022 03/04/22   Anders Otto DASEN, MD  halobetasol  (ULTRAVATE ) 0.05 % cream Apply topically 2 (two) times daily. Apply to rash on the skin Patient not taking: Reported on 07/26/2022 03/10/22   Anders Otto DASEN, MD    Family History Family History  Problem Relation Age of Onset   Bipolar disorder Mother    Bipolar disorder Maternal Aunt    Bipolar disorder Sister    Healthy Father     Social History Social History[1]   Allergies   Patient has no known allergies.   Review of Systems Review of Systems Per HPI  Physical Exam Triage Vital Signs ED Triage Vitals  Encounter Vitals Group     BP 10/31/24 1637 121/83     Girls Systolic BP Percentile --      Girls Diastolic BP Percentile --      Boys Systolic BP Percentile --      Boys Diastolic BP Percentile --      Pulse Rate 10/31/24 1637 79     Resp 10/31/24 1637 16     Temp 10/31/24 1637 98.3 F (36.8 C)     Temp Source 10/31/24 1637 Oral     SpO2 10/31/24 1637 97 %     Weight 10/31/24 1637 110 lb (49.9 kg)     Height 10/31/24 1637 5' 3 (1.6 m)     Head Circumference --      Peak Flow --      Pain Score 10/31/24 1636 10     Pain Loc --      Pain Education --      Exclude from Growth Chart --    No data found.  Updated Vital Signs BP 121/83 (BP Location: Left Arm)   Pulse 79   Temp 98.3 F (36.8 C) (Oral)   Resp 16   Ht 5' 3 (1.6 m)   Wt 110 lb (49.9 kg)   LMP  (LMP Unknown)   SpO2 97%   BMI 19.49 kg/m   Visual Acuity Right Eye Distance:   Left Eye Distance:   Bilateral Distance:    Right Eye Near:   Left Eye Near:    Bilateral Near:     Physical Exam  Vitals and nursing note reviewed.  Constitutional:       General: She is not in acute distress.    Appearance: Normal appearance. She is not toxic-appearing.  HENT:     Head:      Comments: Indurated abscess lateral to left eye.  No fluctuance.  Area is tender to touch.  No active drainage or oozing.    Mouth/Throat:     Mouth: Mucous membranes are moist.     Pharynx: Oropharynx is clear.  Pulmonary:     Effort: Pulmonary effort is normal. No respiratory distress.  Skin:    General: Skin is warm and dry.     Capillary Refill: Capillary refill takes less than 2 seconds.     Findings: Abscess present.  Neurological:     Mental Status: She is alert and oriented to person, place, and time.  Psychiatric:        Behavior: Behavior is cooperative.      UC Treatments / Results  Labs (all labs ordered are listed, but only abnormal results are displayed) Labs Reviewed  POCT URINE PREGNANCY    EKG   Radiology No results found.  Procedures Procedures (including critical care time)  Medications Ordered in UC Medications - No data to display  Initial Impression / Assessment and Plan / UC Course  I have reviewed the triage vital signs and the nursing notes.  Pertinent labs & imaging results that were available during my care of the patient were reviewed by me and considered in my medical decision making (see chart for details).   Patient is a very pleasant, well-appearing 33 year old African-American female presenting today for left-sided facial abscess.  Vital signs are stable and examination overall is reassuring; the abscess is indurated therefore no indication for I&D today.  Urine pregnancy test is negative.  Treat with doxycycline  twice daily for 7 days.  Also encouraged warm compresses and alternate Tylenol /ibuprofen  as needed for pain.  Return and ER precautions discussed with patient.  The patient was given the opportunity to ask questions.  All questions answered to their satisfaction.  The patient is in agreement to this  plan.   Final Clinical Impressions(s) / UC Diagnoses   Final diagnoses:  Facial abscess  Urine pregnancy test negative     Discharge Instructions      Take the doxycycline  twice daily for 7 days to treat the infection in your facial abscess.  Start warm compresses.  You can alternate Tylenol  and ibuprofen  as needed for pain.  Follow-up if symptoms worsen despite treatment.     ED Prescriptions     Medication Sig Dispense Auth. Provider   doxycycline  (VIBRAMYCIN ) 100 MG capsule Take 1 capsule (100 mg total) by mouth 2 (two) times daily for 7 days. 14 capsule Chandra Raisin A, NP   ibuprofen  (ADVIL ) 800 MG tablet Take 1 tablet (800 mg total) by mouth every 8 (eight) hours as needed. Take with food to prevent GI upset 30 tablet Chandra Raisin LABOR, NP      PDMP not reviewed this encounter.    [1]  Social History Tobacco Use   Smoking status: Former    Current packs/day: 2.00    Average packs/day: 2.0 packs/day for 17.9 years (35.8 ttl pk-yrs)    Types: Cigarettes    Start date: 12/14/2006    Passive exposure: Current   Smokeless tobacco: Never  Vaping Use   Vaping status: Never Used  Substance Use Topics   Alcohol use: Yes  Comment: social alcohol drinker   Drug use: No     Chandra Harlene LABOR, NP 10/31/24 1723

## 2024-10-31 NOTE — Discharge Instructions (Signed)
 Take the doxycycline  twice daily for 7 days to treat the infection in your facial abscess.  Start warm compresses.  You can alternate Tylenol  and ibuprofen  as needed for pain.  Follow-up if symptoms worsen despite treatment.

## 2024-10-31 NOTE — ED Triage Notes (Signed)
 Patient presenting with facial swelling and a possible abscess. Patient states she has had a toothache and has some holes in her teeth. States this past Saturday the symptoms got worse.   Patient tried extra strength tylenol  with little relief of throbbing pain only.
